# Patient Record
Sex: Male | Born: 1935 | Race: White | Hispanic: No | Marital: Married | Smoking: Former smoker
Health system: Southern US, Community
[De-identification: ages and names within clinical notes are randomized; demographics above are authoritative.]

## PROBLEM LIST (undated history)

## (undated) DIAGNOSIS — K589 Irritable bowel syndrome without diarrhea: Secondary | ICD-10-CM

## (undated) DIAGNOSIS — E785 Hyperlipidemia, unspecified: Secondary | ICD-10-CM

## (undated) DIAGNOSIS — I10 Essential (primary) hypertension: Secondary | ICD-10-CM

## (undated) DIAGNOSIS — I639 Cerebral infarction, unspecified: Secondary | ICD-10-CM

## (undated) DIAGNOSIS — M1990 Unspecified osteoarthritis, unspecified site: Secondary | ICD-10-CM

## (undated) DIAGNOSIS — D46C Myelodysplastic syndrome with isolated del(5q) chromosomal abnormality: Secondary | ICD-10-CM

## (undated) DIAGNOSIS — F329 Major depressive disorder, single episode, unspecified: Secondary | ICD-10-CM

## (undated) DIAGNOSIS — D696 Thrombocytopenia, unspecified: Secondary | ICD-10-CM

## (undated) DIAGNOSIS — D649 Anemia, unspecified: Secondary | ICD-10-CM

## (undated) DIAGNOSIS — I739 Peripheral vascular disease, unspecified: Secondary | ICD-10-CM

## (undated) DIAGNOSIS — I509 Heart failure, unspecified: Principal | ICD-10-CM

## (undated) DIAGNOSIS — F32A Depression, unspecified: Secondary | ICD-10-CM

## (undated) HISTORY — DX: Unspecified osteoarthritis, unspecified site: M19.90

## (undated) HISTORY — DX: Myelodysplastic syndrome with isolated del(5q) chromosomal abnormality: D46.C

## (undated) HISTORY — DX: Cerebral infarction, unspecified: I63.9

## (undated) HISTORY — DX: Irritable bowel syndrome, unspecified: K58.9

## (undated) HISTORY — DX: Depression, unspecified: F32.A

## (undated) HISTORY — DX: Hypercalcemia: E83.52

## (undated) HISTORY — DX: Major depressive disorder, single episode, unspecified: F32.9

## (undated) HISTORY — DX: Heart failure, unspecified: I50.9

## (undated) HISTORY — DX: Thrombocytopenia, unspecified: D69.6

## (undated) HISTORY — DX: Peripheral vascular disease, unspecified: I73.9

## (undated) HISTORY — DX: Essential (primary) hypertension: I10

## (undated) HISTORY — DX: Hyperlipidemia, unspecified: E78.5

## (undated) HISTORY — DX: Anemia, unspecified: D64.9

---
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
## 1999-02-03 ENCOUNTER — Inpatient Hospital Stay (HOSPITAL_COMMUNITY): Admission: EM | Admit: 1999-02-03 | Discharge: 1999-02-07 | Payer: Self-pay | Admitting: Emergency Medicine

## 1999-02-03 ENCOUNTER — Encounter: Payer: Self-pay | Admitting: Emergency Medicine

## 1999-11-27 ENCOUNTER — Encounter: Admission: RE | Admit: 1999-11-27 | Discharge: 2000-02-25 | Payer: Self-pay | Admitting: Endocrinology

## 2002-05-25 ENCOUNTER — Encounter (INDEPENDENT_AMBULATORY_CARE_PROVIDER_SITE_OTHER): Payer: Self-pay | Admitting: Specialist

## 2002-05-25 ENCOUNTER — Inpatient Hospital Stay (HOSPITAL_COMMUNITY): Admission: RE | Admit: 2002-05-25 | Discharge: 2002-05-27 | Payer: Self-pay | Admitting: Urology

## 2003-06-03 ENCOUNTER — Encounter: Admission: RE | Admit: 2003-06-03 | Discharge: 2003-06-03 | Payer: Self-pay | Admitting: Endocrinology

## 2003-08-06 ENCOUNTER — Encounter: Admission: RE | Admit: 2003-08-06 | Discharge: 2003-08-06 | Payer: Self-pay | Admitting: Neurology

## 2003-08-22 ENCOUNTER — Encounter: Admission: RE | Admit: 2003-08-22 | Discharge: 2003-09-15 | Payer: Self-pay | Admitting: Neurology

## 2003-12-19 ENCOUNTER — Inpatient Hospital Stay (HOSPITAL_COMMUNITY): Admission: EM | Admit: 2003-12-19 | Discharge: 2003-12-23 | Payer: Self-pay | Admitting: Emergency Medicine

## 2003-12-20 ENCOUNTER — Encounter (INDEPENDENT_AMBULATORY_CARE_PROVIDER_SITE_OTHER): Payer: Self-pay | Admitting: Cardiology

## 2003-12-29 ENCOUNTER — Encounter: Admission: RE | Admit: 2003-12-29 | Discharge: 2004-03-28 | Payer: Self-pay | Admitting: *Deleted

## 2005-01-31 ENCOUNTER — Emergency Department (HOSPITAL_COMMUNITY): Admission: EM | Admit: 2005-01-31 | Discharge: 2005-01-31 | Payer: Self-pay | Admitting: Emergency Medicine

## 2005-02-04 ENCOUNTER — Emergency Department (HOSPITAL_COMMUNITY): Admission: EM | Admit: 2005-02-04 | Discharge: 2005-02-05 | Payer: Self-pay | Admitting: Emergency Medicine

## 2005-02-26 ENCOUNTER — Emergency Department (HOSPITAL_COMMUNITY): Admission: EM | Admit: 2005-02-26 | Discharge: 2005-02-26 | Payer: Self-pay | Admitting: *Deleted

## 2005-03-23 ENCOUNTER — Inpatient Hospital Stay (HOSPITAL_COMMUNITY): Admission: EM | Admit: 2005-03-23 | Discharge: 2005-03-23 | Payer: Self-pay | Admitting: Emergency Medicine

## 2005-12-18 ENCOUNTER — Emergency Department (HOSPITAL_COMMUNITY): Admission: EM | Admit: 2005-12-18 | Discharge: 2005-12-19 | Payer: Self-pay | Admitting: Emergency Medicine

## 2006-01-30 ENCOUNTER — Ambulatory Visit (HOSPITAL_COMMUNITY): Admission: RE | Admit: 2006-01-30 | Discharge: 2006-01-30 | Payer: Self-pay | Admitting: Chiropractic Medicine

## 2009-10-07 ENCOUNTER — Inpatient Hospital Stay (HOSPITAL_COMMUNITY): Admission: EM | Admit: 2009-10-07 | Discharge: 2009-10-14 | Payer: Self-pay | Admitting: Emergency Medicine

## 2009-10-08 ENCOUNTER — Encounter (INDEPENDENT_AMBULATORY_CARE_PROVIDER_SITE_OTHER): Payer: Self-pay | Admitting: Internal Medicine

## 2009-10-12 ENCOUNTER — Encounter (INDEPENDENT_AMBULATORY_CARE_PROVIDER_SITE_OTHER): Payer: Self-pay

## 2009-10-16 ENCOUNTER — Ambulatory Visit: Payer: Self-pay | Admitting: Oncology

## 2009-10-20 LAB — CBC WITH DIFFERENTIAL/PLATELET
Eosinophils Absolute: 0.1 10*3/uL (ref 0.0–0.5)
HGB: 10.4 g/dL — ABNORMAL LOW (ref 13.0–17.1)
MCV: 103.5 fL — ABNORMAL HIGH (ref 79.3–98.0)
MONO#: 0.5 10*3/uL (ref 0.1–0.9)
MONO%: 9.5 % (ref 0.0–14.0)
NEUT#: 3.3 10*3/uL (ref 1.5–6.5)
RBC: 2.9 10*6/uL — ABNORMAL LOW (ref 4.20–5.82)
RDW: 19.8 % — ABNORMAL HIGH (ref 11.0–14.6)
WBC: 5.4 10*3/uL (ref 4.0–10.3)
lymph#: 1.3 10*3/uL (ref 0.9–3.3)

## 2009-10-20 LAB — CHCC SMEAR

## 2009-10-25 ENCOUNTER — Other Ambulatory Visit: Admission: RE | Admit: 2009-10-25 | Discharge: 2009-10-25 | Payer: Self-pay | Admitting: Oncology

## 2009-10-25 LAB — SPEP & IFE WITH QIG
Alpha-2-Globulin: 13.1 % — ABNORMAL HIGH (ref 7.1–11.8)
Beta 2: 3.2 % (ref 3.2–6.5)
Beta Globulin: 6.6 % (ref 4.7–7.2)
IgG (Immunoglobin G), Serum: 374 mg/dL — ABNORMAL LOW (ref 694–1618)
Total Protein, Serum Electrophoresis: 6.4 g/dL (ref 6.0–8.3)

## 2009-10-25 LAB — KAPPA/LAMBDA LIGHT CHAINS
Kappa free light chain: 0.89 mg/dL (ref 0.33–1.94)
Lambda Free Lght Chn: 0.92 mg/dL (ref 0.57–2.63)

## 2009-10-30 LAB — CBC WITH DIFFERENTIAL/PLATELET
BASO%: 0.6 % (ref 0.0–2.0)
Basophils Absolute: 0 10*3/uL (ref 0.0–0.1)
EOS%: 3.4 % (ref 0.0–7.0)
HCT: 26.5 % — ABNORMAL LOW (ref 38.4–49.9)
MCH: 35.4 pg — ABNORMAL HIGH (ref 27.2–33.4)
MCHC: 34.6 g/dL (ref 32.0–36.0)
MONO#: 0.6 10*3/uL (ref 0.1–0.9)
MONO%: 13.3 % (ref 0.0–14.0)
NEUT#: 2.3 10*3/uL (ref 1.5–6.5)
NEUT%: 48.6 % (ref 39.0–75.0)
Platelets: 154 10*3/uL (ref 140–400)

## 2009-11-07 ENCOUNTER — Encounter (HOSPITAL_COMMUNITY): Admission: RE | Admit: 2009-11-07 | Discharge: 2009-12-13 | Payer: Self-pay | Admitting: Oncology

## 2009-11-07 LAB — CBC WITH DIFFERENTIAL/PLATELET
BASO%: 1.3 % (ref 0.0–2.0)
Basophils Absolute: 0 10*3/uL (ref 0.0–0.1)
EOS%: 4.6 % (ref 0.0–7.0)
HGB: 9 g/dL — ABNORMAL LOW (ref 13.0–17.1)
MCH: 36.7 pg — ABNORMAL HIGH (ref 27.2–33.4)
MCHC: 35.7 g/dL (ref 32.0–36.0)
MCV: 102.8 fL — ABNORMAL HIGH (ref 79.3–98.0)
MONO%: 11.3 % (ref 0.0–14.0)
RDW: 21.7 % — ABNORMAL HIGH (ref 11.0–14.6)
lymph#: 1.3 10*3/uL (ref 0.9–3.3)

## 2009-11-07 LAB — TYPE & CROSSMATCH - CHCC

## 2009-11-14 LAB — CBC WITH DIFFERENTIAL/PLATELET
BASO%: 1 % (ref 0.0–2.0)
EOS%: 2.7 % (ref 0.0–7.0)
HCT: 28.8 % — ABNORMAL LOW (ref 38.4–49.9)
LYMPH%: 33.1 % (ref 14.0–49.0)
MCH: 34.5 pg — ABNORMAL HIGH (ref 27.2–33.4)
MCHC: 34.6 g/dL (ref 32.0–36.0)
NEUT%: 51.8 % (ref 39.0–75.0)
Platelets: 166 10*3/uL (ref 140–400)
RBC: 2.89 10*6/uL — ABNORMAL LOW (ref 4.20–5.82)
WBC: 4.5 10*3/uL (ref 4.0–10.3)

## 2009-11-22 ENCOUNTER — Ambulatory Visit: Payer: Self-pay | Admitting: Oncology

## 2009-11-23 LAB — CBC WITH DIFFERENTIAL/PLATELET
BASO%: 1.1 % (ref 0.0–2.0)
EOS%: 1.8 % (ref 0.0–7.0)
MCH: 34.6 pg — ABNORMAL HIGH (ref 27.2–33.4)
MCHC: 35.1 g/dL (ref 32.0–36.0)
MONO#: 0.6 10*3/uL (ref 0.1–0.9)
RBC: 2.71 10*6/uL — ABNORMAL LOW (ref 4.20–5.82)
RDW: 23.7 % — ABNORMAL HIGH (ref 11.0–14.6)
WBC: 5.6 10*3/uL (ref 4.0–10.3)
lymph#: 1.7 10*3/uL (ref 0.9–3.3)

## 2009-11-23 LAB — HOLD TUBE, BLOOD BANK

## 2009-12-08 LAB — CBC WITH DIFFERENTIAL/PLATELET
Basophils Absolute: 0.1 10*3/uL (ref 0.0–0.1)
HGB: 6.7 g/dL — CL (ref 13.0–17.1)
LYMPH%: 33.5 % (ref 14.0–49.0)
MONO#: 0.4 10*3/uL (ref 0.1–0.9)
NEUT%: 48.5 % (ref 39.0–75.0)
Platelets: 142 10*3/uL (ref 140–400)
RBC: 2.06 10*6/uL — ABNORMAL LOW (ref 4.20–5.82)

## 2009-12-08 LAB — HOLD TUBE, BLOOD BANK

## 2009-12-11 LAB — TYPE & CROSSMATCH - CHCC

## 2009-12-13 ENCOUNTER — Encounter: Payer: Self-pay | Admitting: Oncology

## 2009-12-13 ENCOUNTER — Ambulatory Visit
Admission: RE | Admit: 2009-12-13 | Discharge: 2009-12-13 | Payer: Self-pay | Source: Home / Self Care | Admitting: Oncology

## 2009-12-13 ENCOUNTER — Ambulatory Visit: Payer: Self-pay | Admitting: Vascular Surgery

## 2009-12-13 LAB — CBC WITH DIFFERENTIAL/PLATELET
BASO%: 3.4 % — ABNORMAL HIGH (ref 0.0–2.0)
EOS%: 6.2 % (ref 0.0–7.0)
HCT: 22.7 % — ABNORMAL LOW (ref 38.4–49.9)
HGB: 7.5 g/dL — ABNORMAL LOW (ref 13.0–17.1)
LYMPH%: 47.8 % (ref 14.0–49.0)
MCH: 31.5 pg (ref 27.2–33.4)
MONO%: 14 % (ref 0.0–14.0)
NEUT#: 0.5 10*3/uL — ABNORMAL LOW (ref 1.5–6.5)
lymph#: 0.9 10*3/uL (ref 0.9–3.3)
nRBC: 0 % (ref 0–0)

## 2009-12-14 LAB — CBC WITH DIFFERENTIAL/PLATELET
Eosinophils Absolute: 0.1 10*3/uL (ref 0.0–0.5)
LYMPH%: 50.5 % — ABNORMAL HIGH (ref 14.0–49.0)
MCHC: 33 g/dL (ref 32.0–36.0)
MONO#: 0.3 10*3/uL (ref 0.1–0.9)
MONO%: 16.2 % — ABNORMAL HIGH (ref 0.0–14.0)
NEUT#: 0.5 10*3/uL — ABNORMAL LOW (ref 1.5–6.5)
NEUT%: 23 % — ABNORMAL LOW (ref 39.0–75.0)
Platelets: 115 10*3/uL — ABNORMAL LOW (ref 140–400)
RBC: 2.41 10*6/uL — ABNORMAL LOW (ref 4.20–5.82)
RDW: 19.1 % — ABNORMAL HIGH (ref 11.0–14.6)
WBC: 2 10*3/uL — ABNORMAL LOW (ref 4.0–10.3)
nRBC: 0 % (ref 0–0)

## 2009-12-14 LAB — TYPE & CROSSMATCH - CHCC

## 2009-12-21 LAB — CBC WITH DIFFERENTIAL/PLATELET
BASO%: 7.6 % — ABNORMAL HIGH (ref 0.0–2.0)
EOS%: 5.2 % (ref 0.0–7.0)
HCT: 27.8 % — ABNORMAL LOW (ref 38.4–49.9)
HGB: 9.1 g/dL — ABNORMAL LOW (ref 13.0–17.1)
LYMPH%: 33.2 % (ref 14.0–49.0)
MCH: 31.2 pg (ref 27.2–33.4)
MCV: 95.2 fL (ref 79.3–98.0)
MONO#: 0.6 10*3/uL (ref 0.1–0.9)
MONO%: 15.5 % — ABNORMAL HIGH (ref 0.0–14.0)
NEUT#: 1.4 10*3/uL — ABNORMAL LOW (ref 1.5–6.5)
Platelets: 171 10*3/uL (ref 140–400)
RDW: 18.3 % — ABNORMAL HIGH (ref 11.0–14.6)
nRBC: 0 % (ref 0–0)

## 2009-12-26 ENCOUNTER — Ambulatory Visit: Payer: Self-pay | Admitting: Oncology

## 2009-12-28 LAB — CBC WITH DIFFERENTIAL/PLATELET
Eosinophils Absolute: 0.3 10*3/uL (ref 0.0–0.5)
LYMPH%: 32.4 % (ref 14.0–49.0)
MCHC: 35.4 g/dL (ref 32.0–36.0)
MCV: 94.3 fL (ref 79.3–98.0)
MONO%: 7.9 % (ref 0.0–14.0)
NEUT#: 1.5 10*3/uL (ref 1.5–6.5)
NEUT%: 47.9 % (ref 39.0–75.0)
Platelets: 102 10*3/uL — ABNORMAL LOW (ref 140–400)
RDW: 21.6 % — ABNORMAL HIGH (ref 11.0–14.6)
WBC: 3.2 10*3/uL — ABNORMAL LOW (ref 4.0–10.3)
lymph#: 1 10*3/uL (ref 0.9–3.3)

## 2009-12-28 LAB — HOLD TUBE, BLOOD BANK

## 2010-01-04 LAB — CBC WITH DIFFERENTIAL/PLATELET
Basophils Absolute: 0 10*3/uL (ref 0.0–0.1)
HCT: 24.3 % — ABNORMAL LOW (ref 38.4–49.9)
LYMPH%: 34.1 % (ref 14.0–49.0)
MCHC: 34.5 g/dL (ref 32.0–36.0)
MONO#: 0.4 10*3/uL (ref 0.1–0.9)
NEUT#: 1.7 10*3/uL (ref 1.5–6.5)
NEUT%: 44.8 % (ref 39.0–75.0)
Platelets: 105 10*3/uL — ABNORMAL LOW (ref 140–400)
RBC: 2.54 10*6/uL — ABNORMAL LOW (ref 4.20–5.82)
RDW: 22.7 % — ABNORMAL HIGH (ref 11.0–14.6)

## 2010-01-11 LAB — CBC WITH DIFFERENTIAL/PLATELET
BASO%: 0.9 % (ref 0.0–2.0)
Basophils Absolute: 0 10*3/uL (ref 0.0–0.1)
EOS%: 7.7 % — ABNORMAL HIGH (ref 0.0–7.0)
Eosinophils Absolute: 0.2 10*3/uL (ref 0.0–0.5)
HCT: 26.1 % — ABNORMAL LOW (ref 38.4–49.9)
HGB: 9.2 g/dL — ABNORMAL LOW (ref 13.0–17.1)
LYMPH%: 40.7 % (ref 14.0–49.0)
MCH: 34.5 pg — ABNORMAL HIGH (ref 27.2–33.4)
MCHC: 35.2 g/dL (ref 32.0–36.0)
MCV: 97.9 fL (ref 79.3–98.0)
MONO#: 0.3 10*3/uL (ref 0.1–0.9)
MONO%: 11.8 % (ref 0.0–14.0)
NEUT#: 1 10*3/uL — ABNORMAL LOW (ref 1.5–6.5)
NEUT%: 38.9 % — ABNORMAL LOW (ref 39.0–75.0)
Platelets: 140 10*3/uL (ref 140–400)
RBC: 2.67 10*6/uL — ABNORMAL LOW (ref 4.20–5.82)
RDW: 23 % — ABNORMAL HIGH (ref 11.0–14.6)
WBC: 2.5 10*3/uL — ABNORMAL LOW (ref 4.0–10.3)
lymph#: 1 10*3/uL (ref 0.9–3.3)

## 2010-01-11 LAB — HOLD TUBE, BLOOD BANK

## 2010-01-17 LAB — CBC WITH DIFFERENTIAL/PLATELET
BASO%: 1.7 % (ref 0.0–2.0)
Basophils Absolute: 0.1 10e3/uL (ref 0.0–0.1)
EOS%: 4.5 % (ref 0.0–7.0)
Eosinophils Absolute: 0.2 10e3/uL (ref 0.0–0.5)
HCT: 29.3 % — ABNORMAL LOW (ref 38.4–49.9)
HGB: 10.2 g/dL — ABNORMAL LOW (ref 13.0–17.1)
LYMPH%: 45.2 % (ref 14.0–49.0)
MCH: 34.5 pg — ABNORMAL HIGH (ref 27.2–33.4)
MCHC: 34.6 g/dL (ref 32.0–36.0)
MCV: 99.7 fL — ABNORMAL HIGH (ref 79.3–98.0)
MONO#: 0.4 10e3/uL (ref 0.1–0.9)
MONO%: 12.8 % (ref 0.0–14.0)
NEUT#: 1.2 10e3/uL — ABNORMAL LOW (ref 1.5–6.5)
NEUT%: 35.8 % — ABNORMAL LOW (ref 39.0–75.0)
Platelets: 163 10e3/uL (ref 140–400)
RBC: 2.94 10e6/uL — ABNORMAL LOW (ref 4.20–5.82)
RDW: 22.1 % — ABNORMAL HIGH (ref 11.0–14.6)
WBC: 3.4 10e3/uL — ABNORMAL LOW (ref 4.0–10.3)
lymph#: 1.5 10e3/uL (ref 0.9–3.3)

## 2010-01-30 ENCOUNTER — Ambulatory Visit: Payer: Self-pay | Admitting: Oncology

## 2010-02-01 LAB — CBC WITH DIFFERENTIAL/PLATELET
Basophils Absolute: 0.2 10*3/uL — ABNORMAL HIGH (ref 0.0–0.1)
EOS%: 6.7 % (ref 0.0–7.0)
Eosinophils Absolute: 0.4 10*3/uL (ref 0.0–0.5)
HGB: 11.7 g/dL — ABNORMAL LOW (ref 13.0–17.1)
LYMPH%: 35 % (ref 14.0–49.0)
MCHC: 32.5 g/dL (ref 32.0–36.0)
MCV: 100.3 fL — ABNORMAL HIGH (ref 79.3–98.0)
MONO%: 14.1 % — ABNORMAL HIGH (ref 0.0–14.0)
NEUT#: 2.2 10*3/uL (ref 1.5–6.5)
NEUT%: 40.8 % (ref 39.0–75.0)
RBC: 3.59 10*6/uL — ABNORMAL LOW (ref 4.20–5.82)
WBC: 5.3 10*3/uL (ref 4.0–10.3)
lymph#: 1.8 10*3/uL (ref 0.9–3.3)

## 2010-02-13 LAB — CBC WITH DIFFERENTIAL/PLATELET
Basophils Absolute: 0.1 10*3/uL (ref 0.0–0.1)
Eosinophils Absolute: 0.3 10*3/uL (ref 0.0–0.5)
HCT: 33.5 % — ABNORMAL LOW (ref 38.4–49.9)
MCHC: 35.3 g/dL (ref 32.0–36.0)
MONO#: 0.4 10*3/uL (ref 0.1–0.9)
RBC: 3.44 10*6/uL — ABNORMAL LOW (ref 4.20–5.82)
RDW: 16.1 % — ABNORMAL HIGH (ref 11.0–14.6)
WBC: 3.4 10*3/uL — ABNORMAL LOW (ref 4.0–10.3)

## 2010-02-28 LAB — CBC WITH DIFFERENTIAL/PLATELET
BASO%: 4.2 % — ABNORMAL HIGH (ref 0.0–2.0)
EOS%: 7.9 % — ABNORMAL HIGH (ref 0.0–7.0)
Eosinophils Absolute: 0.2 10*3/uL (ref 0.0–0.5)
HCT: 36.1 % — ABNORMAL LOW (ref 38.4–49.9)
HGB: 12.2 g/dL — ABNORMAL LOW (ref 13.0–17.1)
LYMPH%: 51.7 % — ABNORMAL HIGH (ref 14.0–49.0)
MCH: 32.9 pg (ref 27.2–33.4)
MCHC: 33.8 g/dL (ref 32.0–36.0)
MCV: 97.3 fL (ref 79.3–98.0)
MONO#: 0.6 10*3/uL (ref 0.1–0.9)
MONO%: 23.3 % — ABNORMAL HIGH (ref 0.0–14.0)
NEUT#: 0.3 10*3/uL — CL (ref 1.5–6.5)
Platelets: 110 10*3/uL — ABNORMAL LOW (ref 140–400)
WBC: 2.4 10*3/uL — ABNORMAL LOW (ref 4.0–10.3)
nRBC: 0 % (ref 0–0)

## 2010-02-28 LAB — COMPREHENSIVE METABOLIC PANEL
Albumin: 4.4 g/dL (ref 3.5–5.2)
Glucose, Bld: 214 mg/dL — ABNORMAL HIGH (ref 70–99)
Potassium: 4.7 mEq/L (ref 3.5–5.3)
Total Bilirubin: 0.6 mg/dL (ref 0.3–1.2)
Total Protein: 6.1 g/dL (ref 6.0–8.3)

## 2010-03-07 ENCOUNTER — Ambulatory Visit: Payer: Self-pay | Admitting: Oncology

## 2010-03-07 LAB — CBC WITH DIFFERENTIAL/PLATELET
Eosinophils Absolute: 0.1 10*3/uL (ref 0.0–0.5)
LYMPH%: 36.9 % (ref 14.0–49.0)
MCHC: 34 g/dL (ref 32.0–36.0)
MCV: 96.4 fL (ref 79.3–98.0)
MONO#: 0.4 10*3/uL (ref 0.1–0.9)
MONO%: 10 % (ref 0.0–14.0)
RBC: 3.6 10*6/uL — ABNORMAL LOW (ref 4.20–5.82)
RDW: 14.3 % (ref 11.0–14.6)
WBC: 4.3 10*3/uL (ref 4.0–10.3)
lymph#: 1.6 10*3/uL (ref 0.9–3.3)

## 2010-03-15 LAB — CBC WITH DIFFERENTIAL/PLATELET
BASO%: 1.5 % (ref 0.0–2.0)
HCT: 36.3 % — ABNORMAL LOW (ref 38.4–49.9)
HGB: 12.5 g/dL — ABNORMAL LOW (ref 13.0–17.1)
LYMPH%: 37 % (ref 14.0–49.0)
MCH: 33.2 pg (ref 27.2–33.4)
MONO%: 6.8 % (ref 0.0–14.0)
Platelets: 152 10*3/uL (ref 140–400)
RBC: 3.76 10*6/uL — ABNORMAL LOW (ref 4.20–5.82)
WBC: 5 10*3/uL (ref 4.0–10.3)
lymph#: 1.9 10*3/uL (ref 0.9–3.3)

## 2010-04-06 ENCOUNTER — Ambulatory Visit: Payer: Self-pay | Admitting: Oncology

## 2010-04-06 LAB — CBC WITH DIFFERENTIAL/PLATELET
BASO%: 1.1 % (ref 0.0–2.0)
HCT: 34.7 % — ABNORMAL LOW (ref 38.4–49.9)
MCH: 33.9 pg — ABNORMAL HIGH (ref 27.2–33.4)
MCHC: 35.5 g/dL (ref 32.0–36.0)
MCV: 95.5 fL (ref 79.3–98.0)
Platelets: 135 10*3/uL — ABNORMAL LOW (ref 140–400)
RBC: 3.63 10*6/uL — ABNORMAL LOW (ref 4.20–5.82)
RDW: 14.3 % (ref 11.0–14.6)
lymph#: 1.5 10*3/uL (ref 0.9–3.3)

## 2010-04-26 LAB — CBC WITH DIFFERENTIAL/PLATELET
Basophils Absolute: 0.1 10*3/uL (ref 0.0–0.1)
EOS%: 3 % (ref 0.0–7.0)
HCT: 35.2 % — ABNORMAL LOW (ref 38.4–49.9)
HGB: 12 g/dL — ABNORMAL LOW (ref 13.0–17.1)
LYMPH%: 32.3 % (ref 14.0–49.0)
MCV: 94.6 fL (ref 79.3–98.0)
NEUT#: 2.8 10*3/uL (ref 1.5–6.5)
NEUT%: 51.4 % (ref 39.0–75.0)
Platelets: 109 10*3/uL — ABNORMAL LOW (ref 140–400)
WBC: 5.4 10*3/uL (ref 4.0–10.3)
nRBC: 0 % (ref 0–0)

## 2010-05-15 ENCOUNTER — Ambulatory Visit: Payer: Self-pay | Admitting: Oncology

## 2010-05-17 LAB — CBC WITH DIFFERENTIAL/PLATELET
Basophils Absolute: 0.1 10*3/uL (ref 0.0–0.1)
Eosinophils Absolute: 0.1 10*3/uL (ref 0.0–0.5)
HGB: 11.8 g/dL — ABNORMAL LOW (ref 13.0–17.1)
LYMPH%: 34.6 % (ref 14.0–49.0)
MCH: 33.3 pg (ref 27.2–33.4)
MCV: 95.2 fL (ref 79.3–98.0)
MONO%: 10 % (ref 0.0–14.0)
NEUT#: 2.2 10*3/uL (ref 1.5–6.5)
Platelets: 107 10*3/uL — ABNORMAL LOW (ref 140–400)

## 2010-05-17 LAB — COMPREHENSIVE METABOLIC PANEL
Alkaline Phosphatase: 44 U/L (ref 39–117)
BUN: 27 mg/dL — ABNORMAL HIGH (ref 6–23)
Creatinine, Ser: 1.09 mg/dL (ref 0.40–1.50)
Glucose, Bld: 230 mg/dL — ABNORMAL HIGH (ref 70–99)
Total Bilirubin: 0.6 mg/dL (ref 0.3–1.2)

## 2010-06-07 LAB — CBC WITH DIFFERENTIAL/PLATELET
Basophils Absolute: 0.1 10*3/uL (ref 0.0–0.1)
Eosinophils Absolute: 0.2 10*3/uL (ref 0.0–0.5)
HGB: 11.6 g/dL — ABNORMAL LOW (ref 13.0–17.1)
LYMPH%: 37.4 % (ref 14.0–49.0)
MCV: 93.7 fL (ref 79.3–98.0)
MONO%: 11.8 % (ref 0.0–14.0)
NEUT#: 1.8 10*3/uL (ref 1.5–6.5)
Platelets: 113 10*3/uL — ABNORMAL LOW (ref 140–400)
RBC: 3.66 10*6/uL — ABNORMAL LOW (ref 4.20–5.82)

## 2010-06-26 ENCOUNTER — Ambulatory Visit: Payer: Self-pay | Admitting: Oncology

## 2010-06-28 LAB — CBC WITH DIFFERENTIAL/PLATELET
Basophils Absolute: 0.1 10*3/uL (ref 0.0–0.1)
Eosinophils Absolute: 0.2 10*3/uL (ref 0.0–0.5)
HCT: 34.5 % — ABNORMAL LOW (ref 38.4–49.9)
HGB: 12 g/dL — ABNORMAL LOW (ref 13.0–17.1)
MCV: 95.7 fL (ref 79.3–98.0)
MONO%: 10.3 % (ref 0.0–14.0)
NEUT#: 2.2 10*3/uL (ref 1.5–6.5)
NEUT%: 48.1 % (ref 39.0–75.0)
RDW: 14.5 % (ref 11.0–14.6)
lymph#: 1.6 10*3/uL (ref 0.9–3.3)

## 2010-07-26 ENCOUNTER — Ambulatory Visit (HOSPITAL_BASED_OUTPATIENT_CLINIC_OR_DEPARTMENT_OTHER): Payer: Medicare Other | Admitting: Oncology

## 2010-07-26 LAB — CBC WITH DIFFERENTIAL/PLATELET
BASO%: 1 % (ref 0.0–2.0)
Basophils Absolute: 0 10*3/uL (ref 0.0–0.1)
Eosinophils Absolute: 0.2 10*3/uL (ref 0.0–0.5)
HCT: 36.4 % — ABNORMAL LOW (ref 38.4–49.9)
MCHC: 34 g/dL (ref 32.0–36.0)
MONO#: 0.5 10*3/uL (ref 0.1–0.9)
Platelets: 108 10*3/uL — ABNORMAL LOW (ref 140–400)
RBC: 3.79 10*6/uL — ABNORMAL LOW (ref 4.20–5.82)
RDW: 14.8 % — ABNORMAL HIGH (ref 11.0–14.6)
WBC: 4.6 10*3/uL (ref 4.0–10.3)

## 2010-09-06 ENCOUNTER — Encounter (HOSPITAL_BASED_OUTPATIENT_CLINIC_OR_DEPARTMENT_OTHER): Payer: Medicare Other | Admitting: Oncology

## 2010-09-06 DIAGNOSIS — D46C Myelodysplastic syndrome with isolated del(5q) chromosomal abnormality: Secondary | ICD-10-CM

## 2010-09-06 DIAGNOSIS — D539 Nutritional anemia, unspecified: Secondary | ICD-10-CM

## 2010-09-06 DIAGNOSIS — R609 Edema, unspecified: Secondary | ICD-10-CM

## 2010-09-06 DIAGNOSIS — Z23 Encounter for immunization: Secondary | ICD-10-CM

## 2010-09-06 LAB — CBC WITH DIFFERENTIAL/PLATELET
Basophils Absolute: 0 10*3/uL (ref 0.0–0.1)
Eosinophils Absolute: 0.2 10*3/uL (ref 0.0–0.5)
HCT: 37.1 % — ABNORMAL LOW (ref 38.4–49.9)
HGB: 12.7 g/dL — ABNORMAL LOW (ref 13.0–17.1)
LYMPH%: 32.7 % (ref 14.0–49.0)
MCV: 93.5 fL (ref 79.3–98.0)
MONO%: 8.7 % (ref 0.0–14.0)
NEUT#: 2 10*3/uL (ref 1.5–6.5)
Platelets: 115 10*3/uL — ABNORMAL LOW (ref 140–400)
RDW: 14.2 % (ref 11.0–14.6)

## 2010-09-16 LAB — CROSSMATCH: Antibody Screen: NEGATIVE

## 2010-09-17 LAB — CROSSMATCH

## 2010-09-18 LAB — CBC
HCT: 26.8 % — ABNORMAL LOW (ref 39.0–52.0)
Hemoglobin: 7.6 g/dL — ABNORMAL LOW (ref 13.0–17.0)
MCHC: 34.1 g/dL (ref 30.0–36.0)
MCHC: 34.9 g/dL (ref 30.0–36.0)
MCV: 103.3 fL — ABNORMAL HIGH (ref 78.0–100.0)
Platelets: 182 10*3/uL (ref 150–400)
RBC: 2.73 MIL/uL — ABNORMAL LOW (ref 4.22–5.81)
RDW: 20.5 % — ABNORMAL HIGH (ref 11.5–15.5)
RDW: 21.7 % — ABNORMAL HIGH (ref 11.5–15.5)
WBC: 4.5 10*3/uL (ref 4.0–10.5)

## 2010-09-18 LAB — CROSSMATCH

## 2010-09-18 LAB — DIFFERENTIAL
Basophils Absolute: 0.1 10*3/uL (ref 0.0–0.1)
Basophils Relative: 1 % (ref 0–1)
Eosinophils Absolute: 0.2 10*3/uL (ref 0.0–0.7)
Eosinophils Relative: 4 % (ref 0–5)
Lymphs Abs: 1.6 10*3/uL (ref 0.7–4.0)

## 2010-09-18 LAB — TYPE AND SCREEN

## 2010-09-18 LAB — GLUCOSE, CAPILLARY
Glucose-Capillary: 183 mg/dL — ABNORMAL HIGH (ref 70–99)
Glucose-Capillary: 195 mg/dL — ABNORMAL HIGH (ref 70–99)

## 2010-09-18 LAB — BASIC METABOLIC PANEL
BUN: 10 mg/dL (ref 6–23)
CO2: 27 mEq/L (ref 19–32)
Calcium: 8.6 mg/dL (ref 8.4–10.5)
Calcium: 8.8 mg/dL (ref 8.4–10.5)
Creatinine, Ser: 0.96 mg/dL (ref 0.4–1.5)
GFR calc Af Amer: >60 mL/min (ref >60)
Glucose, Bld: 124 mg/dL — ABNORMAL HIGH (ref 70–99)
Sodium: 143 mEq/L (ref 135–145)

## 2010-09-18 LAB — BONE MARROW EXAM

## 2010-09-18 LAB — PREPARE RBC (CROSSMATCH)

## 2010-09-19 LAB — GLUCOSE, CAPILLARY
Glucose-Capillary: 117 mg/dL — ABNORMAL HIGH (ref 70–99)
Glucose-Capillary: 120 mg/dL — ABNORMAL HIGH (ref 70–99)
Glucose-Capillary: 123 mg/dL — ABNORMAL HIGH (ref 70–99)
Glucose-Capillary: 138 mg/dL — ABNORMAL HIGH (ref 70–99)
Glucose-Capillary: 164 mg/dL — ABNORMAL HIGH (ref 70–99)
Glucose-Capillary: 165 mg/dL — ABNORMAL HIGH (ref 70–99)
Glucose-Capillary: 177 mg/dL — ABNORMAL HIGH (ref 70–99)
Glucose-Capillary: 178 mg/dL — ABNORMAL HIGH (ref 70–99)
Glucose-Capillary: 198 mg/dL — ABNORMAL HIGH (ref 70–99)
Glucose-Capillary: 229 mg/dL — ABNORMAL HIGH (ref 70–99)
Glucose-Capillary: 240 mg/dL — ABNORMAL HIGH (ref 70–99)
Glucose-Capillary: 367 mg/dL — ABNORMAL HIGH (ref 70–99)
Glucose-Capillary: 376 mg/dL — ABNORMAL HIGH (ref 70–99)

## 2010-09-19 LAB — COMPREHENSIVE METABOLIC PANEL
ALT: 150 U/L — ABNORMAL HIGH (ref 0–53)
AST: 63 U/L — ABNORMAL HIGH (ref 0–37)
Albumin: 3.5 g/dL (ref 3.5–5.2)
BUN: 45 mg/dL — ABNORMAL HIGH (ref 6–23)
Calcium: 8.6 mg/dL (ref 8.4–10.5)
Calcium: 9.2 mg/dL (ref 8.4–10.5)
Chloride: 112 mEq/L (ref 96–112)
Creatinine, Ser: 1.57 mg/dL — ABNORMAL HIGH (ref 0.4–1.5)
GFR calc Af Amer: >60 mL/min (ref >60)
Sodium: 142 mEq/L (ref 135–145)
Total Bilirubin: 0.7 mg/dL (ref 0.3–1.2)
Total Protein: 5.3 g/dL — ABNORMAL LOW (ref 6.0–8.3)

## 2010-09-19 LAB — CBC
HCT: 22.6 % — ABNORMAL LOW (ref 39.0–52.0)
HCT: 22.7 % — ABNORMAL LOW (ref 39.0–52.0)
HCT: 23.2 % — ABNORMAL LOW (ref 39.0–52.0)
HCT: 23.3 % — ABNORMAL LOW (ref 39.0–52.0)
Hemoglobin: 8 g/dL — ABNORMAL LOW (ref 13.0–17.0)
MCHC: 34.4 g/dL (ref 30.0–36.0)
MCHC: 34.5 g/dL (ref 30.0–36.0)
MCHC: 34.6 g/dL (ref 30.0–36.0)
MCHC: 34.9 g/dL (ref 30.0–36.0)
MCHC: 35.2 g/dL (ref 30.0–36.0)
MCHC: 35.2 g/dL (ref 30.0–36.0)
MCV: 105.4 fL — ABNORMAL HIGH (ref 78.0–100.0)
MCV: 105.6 fL — ABNORMAL HIGH (ref 78.0–100.0)
MCV: 105.9 fL — ABNORMAL HIGH (ref 78.0–100.0)
MCV: 113.9 fL — ABNORMAL HIGH (ref 78.0–100.0)
Platelets: 118 10*3/uL — ABNORMAL LOW (ref 150–400)
Platelets: 123 10*3/uL — ABNORMAL LOW (ref 150–400)
Platelets: 130 10*3/uL — ABNORMAL LOW (ref 150–400)
Platelets: 147 10*3/uL — ABNORMAL LOW (ref 150–400)
RBC: 2.14 MIL/uL — ABNORMAL LOW (ref 4.22–5.81)
RBC: 2.14 MIL/uL — ABNORMAL LOW (ref 4.22–5.81)
RDW: 16.8 % — ABNORMAL HIGH (ref 11.5–15.5)
RDW: 21.9 % — ABNORMAL HIGH (ref 11.5–15.5)
RDW: 22 % — ABNORMAL HIGH (ref 11.5–15.5)
RDW: 22 % — ABNORMAL HIGH (ref 11.5–15.5)
RDW: 23 % — ABNORMAL HIGH (ref 11.5–15.5)
RDW: 23.4 % — ABNORMAL HIGH (ref 11.5–15.5)
WBC: 5.1 10*3/uL (ref 4.0–10.5)
WBC: 6.1 10*3/uL (ref 4.0–10.5)
WBC: 7 10*3/uL (ref 4.0–10.5)

## 2010-09-19 LAB — CARDIAC PANEL(CRET KIN+CKTOT+MB+TROPI)
CK, MB: 1.1 ng/mL (ref 0.3–4.0)
CK, MB: 1.2 ng/mL (ref 0.3–4.0)
CK, MB: 1.4 ng/mL (ref 0.3–4.0)
Relative Index: INVALID (ref 0.0–2.5)
Total CK: 35 U/L (ref 7–232)
Total CK: 50 U/L (ref 7–232)
Troponin I: 0.01 ng/mL (ref 0.00–0.06)
Troponin I: 0.02 ng/mL (ref 0.00–0.06)

## 2010-09-19 LAB — BASIC METABOLIC PANEL
BUN: 12 mg/dL (ref 6–23)
CO2: 26 mEq/L (ref 19–32)
Calcium: 8.1 mg/dL — ABNORMAL LOW (ref 8.4–10.5)
Calcium: 8.4 mg/dL (ref 8.4–10.5)
Chloride: 110 mEq/L (ref 96–112)
Creatinine, Ser: 0.82 mg/dL (ref 0.4–1.5)
GFR calc Af Amer: >60 mL/min (ref >60)
GFR calc non Af Amer: >60 mL/min (ref >60)
Glucose, Bld: 105 mg/dL — ABNORMAL HIGH (ref 70–99)
Glucose, Bld: 91 mg/dL (ref 70–99)
Sodium: 140 mEq/L (ref 135–145)

## 2010-09-19 LAB — DIFFERENTIAL
Basophils Absolute: 0.1 10*3/uL (ref 0.0–0.1)
Basophils Absolute: 0.1 10*3/uL (ref 0.0–0.1)
Basophils Absolute: 0.1 10*3/uL (ref 0.0–0.1)
Eosinophils Absolute: 0.1 10*3/uL (ref 0.0–0.7)
Eosinophils Absolute: 0.1 10*3/uL (ref 0.0–0.7)
Eosinophils Relative: 1 % (ref 0–5)
Eosinophils Relative: 2 % (ref 0–5)
Eosinophils Relative: 3 % (ref 0–5)
Lymphs Abs: 1 10*3/uL (ref 0.7–4.0)
Lymphs Abs: 1.2 10*3/uL (ref 0.7–4.0)
Lymphs Abs: 1.2 10*3/uL (ref 0.7–4.0)
Monocytes Absolute: 0.7 10*3/uL (ref 0.1–1.0)
Monocytes Absolute: 0.8 10*3/uL (ref 0.1–1.0)
Monocytes Relative: 13 % — ABNORMAL HIGH (ref 3–12)
Monocytes Relative: 13 % — ABNORMAL HIGH (ref 3–12)
Monocytes Relative: 14 % — ABNORMAL HIGH (ref 3–12)
Neutro Abs: 4.7 10*3/uL (ref 1.7–7.7)
Neutrophils Relative %: 59 % (ref 43–77)
Neutrophils Relative %: 69 % (ref 43–77)

## 2010-09-19 LAB — TYPE AND SCREEN: ABO/RH(D): O POS

## 2010-09-19 LAB — POCT I-STAT, CHEM 8
BUN: 70 mg/dL — ABNORMAL HIGH (ref 6–23)
Creatinine, Ser: 2.3 mg/dL — ABNORMAL HIGH (ref 0.4–1.5)
Glucose, Bld: 205 mg/dL — ABNORMAL HIGH (ref 70–99)
Hemoglobin: 6.1 g/dL — CL (ref 13.0–17.0)
Potassium: 5.8 mEq/L — ABNORMAL HIGH (ref 3.5–5.1)

## 2010-09-19 LAB — URINALYSIS, ROUTINE W REFLEX MICROSCOPIC
Ketones, ur: NEGATIVE mg/dL
Nitrite: NEGATIVE
Protein, ur: NEGATIVE mg/dL

## 2010-09-19 LAB — URINE CULTURE

## 2010-09-19 LAB — MAGNESIUM
Magnesium: 1.5 mg/dL (ref 1.5–2.5)
Magnesium: 2.2 mg/dL (ref 1.5–2.5)

## 2010-09-19 LAB — T4, FREE: Free T4: 1.21 ng/dL (ref 0.80–1.80)

## 2010-09-19 LAB — HEMOGLOBIN A1C: Hgb A1c MFr Bld: 6.6 % — ABNORMAL HIGH (ref 4.6–6.1)

## 2010-09-19 LAB — FOLATE RBC: RBC Folate: 1786 ng/mL — ABNORMAL HIGH (ref 180–600)

## 2010-09-19 LAB — POCT CARDIAC MARKERS: Myoglobin, poc: 128 ng/mL (ref 12–200)

## 2010-09-19 LAB — PROTIME-INR: INR: 1.26 (ref 0.00–1.49)

## 2010-09-19 LAB — HEMOCCULT GUIAC POC 1CARD (OFFICE): Fecal Occult Bld: NEGATIVE

## 2010-09-19 LAB — FERRITIN: Ferritin: 901 ng/mL — ABNORMAL HIGH (ref 22–322)

## 2010-10-18 ENCOUNTER — Encounter (HOSPITAL_BASED_OUTPATIENT_CLINIC_OR_DEPARTMENT_OTHER): Payer: Medicare Other | Admitting: Oncology

## 2010-10-18 ENCOUNTER — Other Ambulatory Visit: Payer: Self-pay | Admitting: Oncology

## 2010-10-18 DIAGNOSIS — D696 Thrombocytopenia, unspecified: Secondary | ICD-10-CM

## 2010-10-18 DIAGNOSIS — R609 Edema, unspecified: Secondary | ICD-10-CM

## 2010-10-18 DIAGNOSIS — D539 Nutritional anemia, unspecified: Secondary | ICD-10-CM

## 2010-10-18 DIAGNOSIS — D649 Anemia, unspecified: Secondary | ICD-10-CM

## 2010-10-18 DIAGNOSIS — D46C Myelodysplastic syndrome with isolated del(5q) chromosomal abnormality: Secondary | ICD-10-CM

## 2010-10-18 LAB — CBC WITH DIFFERENTIAL/PLATELET
BASO%: 1.3 % (ref 0.0–2.0)
Basophils Absolute: 0.1 10*3/uL (ref 0.0–0.1)
EOS%: 4 % (ref 0.0–7.0)
HGB: 11.8 g/dL — ABNORMAL LOW (ref 13.0–17.1)
MCH: 32.7 pg (ref 27.2–33.4)
MCHC: 34.4 g/dL (ref 32.0–36.0)
MCV: 95 fL (ref 79.3–98.0)
MONO%: 9.2 % (ref 0.0–14.0)
RBC: 3.62 10*6/uL — ABNORMAL LOW (ref 4.20–5.82)
RDW: 14.6 % (ref 11.0–14.6)
lymph#: 1.2 10*3/uL (ref 0.9–3.3)
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
## 2010-11-16 NOTE — Op Note (Signed)
NAME:  Aaron Bruce, Aaron Bruce                           ACCOUNT NO.:  [id number]   MEDICAL RECORD NO.:  [id number]                   PATIENT TYPE:  INP   LOCATION:  X009                                 FACILITY:  [facility]   PHYSICIAN:  Lucrezia Starch. Earlene Plater, M.D.               DATE OF BIRTH:  May 04, 1925   DATE OF PROCEDURE:  05/25/2002  DATE OF DISCHARGE:                                 OPERATIVE REPORT   PREOPERATIVE DIAGNOSIS:  Bladder outlet obstruction/benign prostatic  hypertrophy.   POSTOPERATIVE DIAGNOSIS:  Bladder outlet obstruction/benign prostatic  hypertrophy.   PROCEDURE:  1. Cystoscopy.  2. Transurethral resection of the prostate.   ANESTHESIA:  General.   SURGEON:  Ronald L. Earlene Plater, M.D.   ASSISTANT:  Melvyn Novas, M.D.   ESTIMATED BLOOD LOSS:  Minimal.   SPECIMENS:  Prostate chips.   DRAINS:  Foley catheter.   COMPLICATIONS:  None.   BRIEF HISTORY:  [name] is a very nice 77 year old white male, with a  history of bladder outlet obstruction symptoms which have not responded to  medical therapy.  He has had urodynamics which were consistent with bladder  outlet obstruction.  After discussion of the surgical options for management  of bladder outlet obstruction, [name] opted for transurethral resection  of the prostate.   DESCRIPTION OF PROCEDURE:  Following the administration of IV antibiotics  and anesthesia, [name] was prepped and draped in the dorsal lithotomy  position.  Standard cystoscopy was performed using 22 French sheath and 30  and degree lens.  The bladder was 1+ trabeculated throughout.  Both ureteral  orifices were identified in their anatomic positions on the trigone.  There  were no mucosal abnormalities noted.  The cystoscope was then removed.  The  urethra was dilated systematically to 30 [place] using [employer].  The  resectoscope sheath was then placed using an obturator without difficulty.  The resectoscope was then placed through  the sheath.  A 12 degree lens was  used for resection.  Using the cutting current, we began the resection at 6  o'clock at the bladder neck.  The middle lobe was resected systemically at 6  o'clock from the bladder neck to the verumontanum, down to the circular  fibers of the surgical capsule.  We then turned our attention to the  patient's left lateral lobe which was systemically resected from the 1  o'clock position down to 6 o'clock from the bladder neck to the verumontanum  and down to the surgical capsule.  This was repeated on the contralateral  side.  The hanging curtain of tissue at 12 o'clock was then resected in a  similar fashion.  The prostate chips were then evacuated from the bladder  using an irrigating syringe.  The bladder emptied.  The prostatic urethra  was again inspected, and any additional adenoma was resected.  All sites of  venous bleeding  were fulgurated using the coagulation current.  There was no  other evidence of any bleeding.  The resectoscope was then removed.  A 22  French Foley catheter was then placed.  The bladder was irrigated.  The  urine was light pink in quality.  The catheter irrigated freely  quantitatively.  The procedure was then terminated after placing the  catheter on gentle rubber band traction.   DISPOSITION:  The patient was taken to the recovery room in stable  condition.     Melvyn Novas, M.D.                      Ronald L. Earlene Plater, M.D.    DK/MEDQ  D:  05/25/2002  T:  05/25/2002  Job:  [id number]

## 2010-11-29 ENCOUNTER — Other Ambulatory Visit: Payer: Self-pay | Admitting: Oncology

## 2010-11-29 ENCOUNTER — Encounter (HOSPITAL_BASED_OUTPATIENT_CLINIC_OR_DEPARTMENT_OTHER): Payer: Medicare Other | Admitting: Oncology

## 2010-11-29 DIAGNOSIS — D539 Nutritional anemia, unspecified: Secondary | ICD-10-CM

## 2010-11-29 DIAGNOSIS — D696 Thrombocytopenia, unspecified: Secondary | ICD-10-CM

## 2010-11-29 DIAGNOSIS — R609 Edema, unspecified: Secondary | ICD-10-CM

## 2010-11-29 DIAGNOSIS — D46C Myelodysplastic syndrome with isolated del(5q) chromosomal abnormality: Secondary | ICD-10-CM

## 2010-11-29 LAB — CBC WITH DIFFERENTIAL/PLATELET
EOS%: 3.9 % (ref 0.0–7.0)
Eosinophils Absolute: 0.2 10*3/uL (ref 0.0–0.5)
HGB: 11.4 g/dL — ABNORMAL LOW (ref 13.0–17.1)
MCV: 96.1 fL (ref 79.3–98.0)
MONO%: 10.7 % (ref 0.0–14.0)
NEUT#: 2.5 10*3/uL (ref 1.5–6.5)
RBC: 3.45 10*6/uL — ABNORMAL LOW (ref 4.20–5.82)
RDW: 15.4 % — ABNORMAL HIGH (ref 11.0–14.6)
lymph#: 1.2 10*3/uL (ref 0.9–3.3)

## 2010-12-20 ENCOUNTER — Other Ambulatory Visit: Payer: Self-pay | Admitting: Oncology

## 2010-12-20 ENCOUNTER — Encounter (HOSPITAL_BASED_OUTPATIENT_CLINIC_OR_DEPARTMENT_OTHER): Payer: Medicare Other | Admitting: Oncology

## 2010-12-20 DIAGNOSIS — D696 Thrombocytopenia, unspecified: Secondary | ICD-10-CM

## 2010-12-20 DIAGNOSIS — D539 Nutritional anemia, unspecified: Secondary | ICD-10-CM

## 2010-12-20 DIAGNOSIS — D46C Myelodysplastic syndrome with isolated del(5q) chromosomal abnormality: Secondary | ICD-10-CM

## 2010-12-20 DIAGNOSIS — R609 Edema, unspecified: Secondary | ICD-10-CM

## 2010-12-20 LAB — CBC WITH DIFFERENTIAL/PLATELET
Basophils Absolute: 0 10*3/uL (ref 0.0–0.1)
Eosinophils Absolute: 0.2 10*3/uL (ref 0.0–0.5)
HGB: 12.1 g/dL — ABNORMAL LOW (ref 13.0–17.1)
MONO#: 0.3 10*3/uL (ref 0.1–0.9)
NEUT#: 2 10*3/uL (ref 1.5–6.5)
Platelets: 121 10*3/uL — ABNORMAL LOW (ref 140–400)
RBC: 3.69 10*6/uL — ABNORMAL LOW (ref 4.20–5.82)
RDW: 15.6 % — ABNORMAL HIGH (ref 11.0–14.6)
WBC: 3.8 10*3/uL — ABNORMAL LOW (ref 4.0–10.3)

## 2011-01-18 ENCOUNTER — Other Ambulatory Visit: Payer: Self-pay | Admitting: Oncology

## 2011-01-18 ENCOUNTER — Encounter (HOSPITAL_BASED_OUTPATIENT_CLINIC_OR_DEPARTMENT_OTHER): Payer: Medicare Other | Admitting: Oncology

## 2011-01-18 DIAGNOSIS — D46C Myelodysplastic syndrome with isolated del(5q) chromosomal abnormality: Secondary | ICD-10-CM

## 2011-01-18 DIAGNOSIS — D539 Nutritional anemia, unspecified: Secondary | ICD-10-CM

## 2011-01-18 DIAGNOSIS — D696 Thrombocytopenia, unspecified: Secondary | ICD-10-CM

## 2011-01-18 DIAGNOSIS — R609 Edema, unspecified: Secondary | ICD-10-CM

## 2011-01-18 LAB — COMPREHENSIVE METABOLIC PANEL
BUN: 25 mg/dL — ABNORMAL HIGH (ref 6–23)
CO2: 27 mEq/L (ref 19–32)
Creatinine, Ser: 1.03 mg/dL (ref 0.50–1.35)
Glucose, Bld: 191 mg/dL — ABNORMAL HIGH (ref 70–99)
Sodium: 137 mEq/L (ref 135–145)
Total Bilirubin: 0.3 mg/dL (ref 0.3–1.2)
Total Protein: 6.5 g/dL (ref 6.0–8.3)

## 2011-01-18 LAB — CBC WITH DIFFERENTIAL/PLATELET
Basophils Absolute: 0.1 10*3/uL (ref 0.0–0.1)
Eosinophils Absolute: 0.2 10*3/uL (ref 0.0–0.5)
HCT: 36.1 % — ABNORMAL LOW (ref 38.4–49.9)
HGB: 11.9 g/dL — ABNORMAL LOW (ref 13.0–17.1)
LYMPH%: 27.5 % (ref 14.0–49.0)
MONO#: 0.5 10*3/uL (ref 0.1–0.9)
NEUT#: 2.8 10*3/uL (ref 1.5–6.5)
NEUT%: 57 % (ref 39.0–75.0)
Platelets: 113 10*3/uL — ABNORMAL LOW (ref 140–400)
WBC: 4.9 10*3/uL (ref 4.0–10.3)

## 2011-02-14 ENCOUNTER — Other Ambulatory Visit: Payer: Self-pay | Admitting: Oncology

## 2011-02-14 ENCOUNTER — Encounter (HOSPITAL_BASED_OUTPATIENT_CLINIC_OR_DEPARTMENT_OTHER): Payer: Medicare Other | Admitting: Oncology

## 2011-02-14 DIAGNOSIS — D539 Nutritional anemia, unspecified: Secondary | ICD-10-CM

## 2011-02-14 DIAGNOSIS — D46C Myelodysplastic syndrome with isolated del(5q) chromosomal abnormality: Secondary | ICD-10-CM

## 2011-02-14 DIAGNOSIS — R609 Edema, unspecified: Secondary | ICD-10-CM

## 2011-02-14 DIAGNOSIS — D696 Thrombocytopenia, unspecified: Secondary | ICD-10-CM

## 2011-02-14 LAB — CBC WITH DIFFERENTIAL/PLATELET
Eosinophils Absolute: 0.3 10*3/uL (ref 0.0–0.5)
LYMPH%: 30.8 % (ref 14.0–49.0)
MONO#: 0.5 10*3/uL (ref 0.1–0.9)
NEUT#: 2.5 10*3/uL (ref 1.5–6.5)
Platelets: 121 10*3/uL — ABNORMAL LOW (ref 140–400)
RBC: 3.69 10*6/uL — ABNORMAL LOW (ref 4.20–5.82)
WBC: 4.8 10*3/uL (ref 4.0–10.3)

## 2011-03-14 ENCOUNTER — Other Ambulatory Visit: Payer: Self-pay | Admitting: Oncology

## 2011-03-14 ENCOUNTER — Encounter (HOSPITAL_BASED_OUTPATIENT_CLINIC_OR_DEPARTMENT_OTHER): Payer: Medicare Other | Admitting: Oncology

## 2011-03-14 DIAGNOSIS — D696 Thrombocytopenia, unspecified: Secondary | ICD-10-CM

## 2011-03-14 DIAGNOSIS — R609 Edema, unspecified: Secondary | ICD-10-CM

## 2011-03-14 DIAGNOSIS — D46C Myelodysplastic syndrome with isolated del(5q) chromosomal abnormality: Secondary | ICD-10-CM

## 2011-03-14 DIAGNOSIS — D539 Nutritional anemia, unspecified: Secondary | ICD-10-CM

## 2011-03-14 LAB — CBC WITH DIFFERENTIAL/PLATELET
BASO%: 0.9 % (ref 0.0–2.0)
Basophils Absolute: 0 10*3/uL (ref 0.0–0.1)
EOS%: 4.4 % (ref 0.0–7.0)
Eosinophils Absolute: 0.2 10*3/uL (ref 0.0–0.5)
HCT: 34 % — ABNORMAL LOW (ref 38.4–49.9)
MCHC: 34.1 g/dL (ref 32.0–36.0)
MONO#: 0.6 10*3/uL (ref 0.1–0.9)
MONO%: 10.7 % (ref 0.0–14.0)
NEUT#: 3.2 10*3/uL (ref 1.5–6.5)
Platelets: 112 10*3/uL — ABNORMAL LOW (ref 140–400)
WBC: 5.3 10*3/uL (ref 4.0–10.3)

## 2011-03-14 LAB — COMPREHENSIVE METABOLIC PANEL
ALT: 13 U/L (ref 0–53)
AST: 12 U/L (ref 0–37)
Albumin: 4.4 g/dL (ref 3.5–5.2)
Alkaline Phosphatase: 64 U/L (ref 39–117)
BUN: 26 mg/dL — ABNORMAL HIGH (ref 6–23)
Calcium: 9.8 mg/dL (ref 8.4–10.5)
Chloride: 103 mEq/L (ref 96–112)
Potassium: 4.5 mEq/L (ref 3.5–5.3)
Sodium: 141 mEq/L (ref 135–145)
Total Protein: 6.2 g/dL (ref 6.0–8.3)

## 2011-05-02 ENCOUNTER — Encounter (HOSPITAL_BASED_OUTPATIENT_CLINIC_OR_DEPARTMENT_OTHER): Payer: Medicare Other | Admitting: Oncology

## 2011-05-02 ENCOUNTER — Other Ambulatory Visit: Payer: Self-pay | Admitting: Oncology

## 2011-05-02 DIAGNOSIS — R609 Edema, unspecified: Secondary | ICD-10-CM

## 2011-05-02 DIAGNOSIS — D539 Nutritional anemia, unspecified: Secondary | ICD-10-CM

## 2011-05-02 DIAGNOSIS — D469 Myelodysplastic syndrome, unspecified: Secondary | ICD-10-CM

## 2011-05-02 DIAGNOSIS — D46C Myelodysplastic syndrome with isolated del(5q) chromosomal abnormality: Secondary | ICD-10-CM

## 2011-05-02 DIAGNOSIS — D696 Thrombocytopenia, unspecified: Secondary | ICD-10-CM

## 2011-05-02 LAB — COMPREHENSIVE METABOLIC PANEL
ALT: 17 U/L (ref 0–53)
AST: 17 U/L (ref 0–37)
Albumin: 4.2 g/dL (ref 3.5–5.2)
Alkaline Phosphatase: 86 U/L (ref 39–117)
BUN: 21 mg/dL (ref 6–23)
Creatinine, Ser: 1.02 mg/dL (ref 0.50–1.35)
Potassium: 4.2 mEq/L (ref 3.5–5.3)

## 2011-05-02 LAB — CBC WITH DIFFERENTIAL/PLATELET
BASO%: 1.8 % (ref 0.0–2.0)
Basophils Absolute: 0.1 10*3/uL (ref 0.0–0.1)
EOS%: 3.9 % (ref 0.0–7.0)
HGB: 12.8 g/dL — ABNORMAL LOW (ref 13.0–17.1)
MCH: 33.4 pg (ref 27.2–33.4)
MCHC: 34.2 g/dL (ref 32.0–36.0)
MCV: 97.7 fL (ref 79.3–98.0)
MONO%: 11.3 % (ref 0.0–14.0)
RDW: 15.3 % — ABNORMAL HIGH (ref 11.0–14.6)
lymph#: 1.1 10*3/uL (ref 0.9–3.3)

## 2011-05-06 ENCOUNTER — Telehealth: Payer: Self-pay | Admitting: *Deleted

## 2011-05-06 NOTE — Telephone Encounter (Signed)
RECEIVED A FAX FROM [facility]CERNING A PRESCRIPTION REFILL REQUEST FOR REVLIMID. THIS REQUEST WAS GIVEN TO DR.SHERRILL'S NURSE, SUSAN COWARD,RN. 

## 2011-05-07 ENCOUNTER — Other Ambulatory Visit: Payer: Self-pay | Admitting: *Deleted

## 2011-05-07 ENCOUNTER — Encounter: Payer: Self-pay | Admitting: *Deleted

## 2011-05-07 NOTE — Progress Notes (Signed)
RECEIVED A FAX FROM [facility]CERNING A CONFIRMATION OF FACSIMILE RECEIPT. THIS NOTICE WAS GIVEN TO DR.SHERRILL'S NURSE, SUSAN COWARD,RN. 

## 2011-05-11 ENCOUNTER — Encounter: Payer: Self-pay | Admitting: *Deleted

## 2011-05-11 ENCOUNTER — Other Ambulatory Visit: Payer: Self-pay | Admitting: *Deleted

## 2011-05-11 MED ORDER — REVLIMID 5 MG PO CAPS: 5.0000 mg | ORAL_CAPSULE | ORAL | Status: DC

## 2011-06-06 ENCOUNTER — Encounter: Payer: Self-pay | Admitting: *Deleted

## 2011-06-06 NOTE — Progress Notes (Signed)
Refill request for Revlimid received from [facility].  Request to MD for review. 

## 2011-06-07 ENCOUNTER — Other Ambulatory Visit: Payer: Self-pay | Admitting: *Deleted

## 2011-06-07 MED ORDER — REVLIMID 5 MG PO CAPS: 5.0000 mg | ORAL_CAPSULE | ORAL | Status: DC

## 2011-06-07 NOTE — Progress Notes (Signed)
Received fax verification that [facility] has received script for pt and will ship upon insurance verification. dph

## 2011-06-14 ENCOUNTER — Other Ambulatory Visit: Payer: Self-pay | Admitting: *Deleted

## 2011-06-17 ENCOUNTER — Other Ambulatory Visit: Payer: Self-pay | Admitting: Oncology

## 2011-06-17 ENCOUNTER — Other Ambulatory Visit (HOSPITAL_BASED_OUTPATIENT_CLINIC_OR_DEPARTMENT_OTHER): Payer: Medicare Other | Admitting: Lab

## 2011-06-17 ENCOUNTER — Ambulatory Visit (HOSPITAL_BASED_OUTPATIENT_CLINIC_OR_DEPARTMENT_OTHER): Payer: Medicare Other | Admitting: Nurse Practitioner

## 2011-06-17 VITALS — BP 141/62 | HR 61 | Temp 97.6°F | Ht 66.0 in | Wt 164.8 lb

## 2011-06-17 DIAGNOSIS — D539 Nutritional anemia, unspecified: Secondary | ICD-10-CM

## 2011-06-17 DIAGNOSIS — D46C Myelodysplastic syndrome with isolated del(5q) chromosomal abnormality: Secondary | ICD-10-CM | POA: Insufficient documentation

## 2011-06-17 DIAGNOSIS — R609 Edema, unspecified: Secondary | ICD-10-CM

## 2011-06-17 DIAGNOSIS — D696 Thrombocytopenia, unspecified: Secondary | ICD-10-CM

## 2011-06-17 LAB — CBC WITH DIFFERENTIAL/PLATELET
BASO%: 0.9 % (ref 0.0–2.0)
Basophils Absolute: 0 10e3/uL (ref 0.0–0.1)
EOS%: 3.7 % (ref 0.0–7.0)
Eosinophils Absolute: 0.2 10e3/uL (ref 0.0–0.5)
HCT: 35.3 % — ABNORMAL LOW (ref 38.4–49.9)
HGB: 12 g/dL — ABNORMAL LOW (ref 13.0–17.1)
LYMPH%: 28.8 % (ref 14.0–49.0)
MCH: 32.8 pg (ref 27.2–33.4)
MCHC: 34 g/dL (ref 32.0–36.0)
MCV: 96.4 fL (ref 79.3–98.0)
MONO#: 0.6 10e3/uL (ref 0.1–0.9)
MONO%: 11.5 % (ref 0.0–14.0)
NEUT#: 2.8 10e3/uL (ref 1.5–6.5)
NEUT%: 55.1 % (ref 39.0–75.0)
Platelets: 98 10e3/uL — ABNORMAL LOW (ref 140–400)
RBC: 3.66 10e6/uL — ABNORMAL LOW (ref 4.20–5.82)
RDW: 15.4 % — ABNORMAL HIGH (ref 11.0–14.6)
WBC: 5.2 10e3/uL (ref 4.0–10.3)
lymph#: 1.5 10e3/uL (ref 0.9–3.3)

## 2011-06-17 NOTE — Progress Notes (Signed)
OFFICE PROGRESS NOTE  Interval history:  [name] is an 77 year old man with MDS. He is maintained on Revlimid 5 mg every other day. He is seen today for scheduled followup.  [name] reports that he injured his back about 3 weeks ago when pulling a lawnmower. The pain has improved significantly. No interim illnesses or infections. He denies fevers. No nausea or vomiting. He has a good appetite. No shortness of breath. He reports intermittent numbness of the left hand when exposed to cold.   Objective: Blood pressure 141/62, pulse 61, temperature 97.6 F (36.4 C), temperature source Oral, height 5\' 6"  (1.676 m), weight 164 lb 12.8 oz (74.753 kg).  Oropharynx is without thrush or ulceration. No palpable cervical, supraclavicular or axillary lymph nodes. Lungs are clear. No wheezes or rales. Regular cardiac rhythm. Abdomen soft and nontender. No organomegaly. 1+ pitting edema at the lower legs bilaterally. Vibratory sense is mildly to moderately decreased at the fingertips bilaterally.   Lab Results: Lab Results  Component Value Date   WBC 5.2 06/17/2011   HGB 12.0* 06/17/2011   HCT 35.3* 06/17/2011   MCV 96.4 06/17/2011   PLT 98* 06/17/2011       Studies/Results: No results found.  Medications: I have reviewed the patient's current medications.  Assessment/Plan:  1. 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  He began Revlimid 11/30/2009.  Revlimid was placed on hold 12/13/2009 when the absolute neutrophil count returned at 0.5. The Revlimid was resumed at a dose of 10 mg every other day on 12/21/2009.  The anemia improved.  Revlimid was decreased to 5 mg every other day  01/2010 due to neutropenia. 2. Probable cellulitis of the left lower posterior leg 12/13/2009, resolved following treatment with ciprofloxacin.   3. Left lower extremity edema and pain 12/13/2009 with a negative venous Doppler. 4. History of hypertension. 5. Diabetes. 6. History of  a cerebrovascular accident. 7. Hyperlipidemia. 8. Peripheral vascular disease. 9. History of neutropenia secondary to Revlimid, resolved. 10. Mild thrombocytopenia, likely secondary to Revlimid, stable. 11. Recent back injury-the pain is improving.  Disposition-Aaron Bruce is stable from a hematologic standpoint. He will continue Revlimid 5 mg every other day. He will return for an office visit and CBC in 6 weeks. He will contact the office in the interim with any problems.  Plan reviewed with Dr. Derenda Fennel, Misty Stanley ANP/GNP-BC

## 2011-07-05 ENCOUNTER — Other Ambulatory Visit: Payer: Self-pay | Admitting: *Deleted

## 2011-07-05 NOTE — Telephone Encounter (Signed)
THIS REQUEST WAS GIVEN TO DR.SHERRILL'S NURSE, SUSAN COWARD,RN.

## 2011-07-08 ENCOUNTER — Other Ambulatory Visit: Payer: Self-pay | Admitting: *Deleted

## 2011-07-08 ENCOUNTER — Encounter: Payer: Self-pay | Admitting: *Deleted

## 2011-07-08 MED ORDER — REVLIMID 5 MG PO CAPS: 5.0000 mg | ORAL_CAPSULE | ORAL | Status: DC

## 2011-07-08 NOTE — Progress Notes (Signed)
RECEIVED A FAX FROM [facility]ERNING A CONFIRMATION OF FACSIMILE RECEIPT. 

## 2011-07-09 ENCOUNTER — Other Ambulatory Visit: Payer: Self-pay | Admitting: Endocrinology

## 2011-07-09 DIAGNOSIS — M549 Dorsalgia, unspecified: Secondary | ICD-10-CM

## 2011-07-16 ENCOUNTER — Telehealth: Payer: Self-pay | Admitting: *Deleted

## 2011-07-16 NOTE — Telephone Encounter (Signed)
Patient called that he has received letter in mail from [employer] that he needs to document his income and assets again. Is requesting advice on how to do this. Informed him I will have managed care call him tomorrow, since they have helped him with this in the past.

## 2011-07-18 ENCOUNTER — Ambulatory Visit
Admission: RE | Admit: 2011-07-18 | Discharge: 2011-07-18 | Disposition: A | Discharge status: Home / Self Care | Payer: Medicare Other | Source: Ambulatory Visit | Attending: Endocrinology | Admitting: Endocrinology

## 2011-07-18 ENCOUNTER — Other Ambulatory Visit: Payer: Self-pay | Admitting: *Deleted

## 2011-07-18 DIAGNOSIS — M549 Dorsalgia, unspecified: Secondary | ICD-10-CM

## 2011-07-18 NOTE — Telephone Encounter (Signed)
Shipment delay due to no co pay assistance yet. Patient has completed all paperwork and are waiting to hear from [employer]. Awaiting approval. His co pay would be $2862.00 Patient is aware.

## 2011-07-23 ENCOUNTER — Other Ambulatory Visit (HOSPITAL_COMMUNITY): Payer: Self-pay | Admitting: Endocrinology

## 2011-07-23 DIAGNOSIS — IMO0002 Reserved for concepts with insufficient information to code with codable children: Secondary | ICD-10-CM

## 2011-07-24 ENCOUNTER — Encounter (HOSPITAL_COMMUNITY): Payer: Self-pay | Admitting: Pharmacy Technician

## 2011-07-29 ENCOUNTER — Ambulatory Visit (HOSPITAL_COMMUNITY)
Admission: RE | Admit: 2011-07-29 | Discharge: 2011-07-29 | Disposition: A | Discharge status: Home / Self Care | Payer: Medicare Other | Source: Ambulatory Visit | Attending: Endocrinology | Admitting: Endocrinology

## 2011-07-29 ENCOUNTER — Other Ambulatory Visit (HOSPITAL_COMMUNITY): Payer: Self-pay | Admitting: Interventional Radiology

## 2011-07-29 DIAGNOSIS — IMO0002 Reserved for concepts with insufficient information to code with codable children: Secondary | ICD-10-CM

## 2011-07-29 NOTE — Consult Note (Signed)
  Consult note.  Referring physician.   Claudie Fisherman MD.  Reason  for consultation.  #1 new onset severe low back pain due to two  compression fractures in the   lumbar region...  History of present illness.  78 year old right-handed gentleman who presents with severe low-back pain of 2 months duration.  The patient apparently experienced moderate degree of pain while bending over to work on his   lawnmower approximately 2 months ago..  Over the next few days the pain became increasingly severe to where  he describes his pain as being in the 8 over 10 range  despite taking analgesics.   Presently he describes his pain as being constant, with  severe exacerbations while coughing bending or walking. The pain when severe  is described as being sharp. He denies any radiation of this pain into his lower extremities on into his chest area. Denies any new autonomic dysfunction of his bowel or bladder.  His problems with frequency of micturition since his prostate surgery have remained unchanged.  He denies any changes in his appetite. Denies new recent chills or fever. He reports that he stopped taking pain medication because of excessive constipation. However he is unable to recollect the name of this medication.  Past medical history..  Hypertension. Diabetes. CVA. Hyperlipidemia. Peripheral vascular disease and myelodysplastic syndromefor which he is being seen by Dr. Alcide Evener.   Surgery on his prostate . Reports having kidney surgery for removal of stones .  Allergies. Denies allergies to Dye , latex, iodine, and penicillin.  Medications. He takes numerous medications. Most of these are homeopathic over-the-counter medications..  Other medications include Norvasc, Bisacodyl, Celebrex, glyburide, and Revlimid, metformin, and Micardis.  His social history.   Married with 2 children. Lives with his wife.  Denies tobacco or alcohol use.  Is a retired Optician, dispensing .   Family  History significant for heart disease in both parents and 2 siblings.  ROS Systems essentially negative for pathological symptomatology..  Examination.  Patient appeared alert awake oriented to time place space  including  obvious distress secondary to severe plane persisting slight movement of his back and legs . No lateralizing neurological  signs were detected otherwise.  Assessment..  The patient's MRI of the lumbosacral spine was reviewed with the patient and his sister-in-law. The MRIrevealed significant compression fractures at L3 and L4 both of which demonstrated mild retropulsion without compromise of the neural foramina.  The options of continued conservative management with pain relieving medications and limited activity versus vertebral body augmentation with methylmethacrylate mixture using the kyphoplasty and or  vertebroplasty technique to relieve pain, and to prevent further compression   deformity were discussed in detail.  Remote risks of infection and methylmethacrylate   leakage  were discussed in detail, as were ways to prevent these from happening.  The patient and the family  have  decided to proceed with the  vertebral body augmentation procedure. This has then scheduled for Wednesday I, January 31., They have been asked to call for any concerns or questions.  Dr Fatima Sanger MD

## 2011-07-30 ENCOUNTER — Telehealth: Payer: Self-pay | Admitting: Oncology

## 2011-07-30 ENCOUNTER — Other Ambulatory Visit: Payer: Self-pay | Admitting: Radiology

## 2011-07-30 NOTE — Telephone Encounter (Signed)
pt called and cancelled appt for 01/31.  pt stated that her will call back to r/s appt

## 2011-07-31 ENCOUNTER — Ambulatory Visit (HOSPITAL_COMMUNITY)
Admission: RE | Admit: 2011-07-31 | Discharge: 2011-07-31 | Disposition: A | Discharge status: Home / Self Care | Payer: Medicare Other | Source: Ambulatory Visit | Attending: Interventional Radiology | Admitting: Interventional Radiology

## 2011-07-31 ENCOUNTER — Telehealth: Payer: Self-pay | Admitting: *Deleted

## 2011-07-31 ENCOUNTER — Other Ambulatory Visit: Payer: Self-pay | Admitting: Interventional Radiology

## 2011-07-31 ENCOUNTER — Other Ambulatory Visit (HOSPITAL_COMMUNITY): Payer: Self-pay | Admitting: Interventional Radiology

## 2011-07-31 DIAGNOSIS — E785 Hyperlipidemia, unspecified: Secondary | ICD-10-CM | POA: Insufficient documentation

## 2011-07-31 DIAGNOSIS — I10 Essential (primary) hypertension: Secondary | ICD-10-CM | POA: Insufficient documentation

## 2011-07-31 DIAGNOSIS — D46C Myelodysplastic syndrome with isolated del(5q) chromosomal abnormality: Secondary | ICD-10-CM | POA: Insufficient documentation

## 2011-07-31 DIAGNOSIS — M1990 Unspecified osteoarthritis, unspecified site: Secondary | ICD-10-CM | POA: Insufficient documentation

## 2011-07-31 DIAGNOSIS — S32009A Unspecified fracture of unspecified lumbar vertebra, initial encounter for closed fracture: Secondary | ICD-10-CM | POA: Insufficient documentation

## 2011-07-31 DIAGNOSIS — X58XXXA Exposure to other specified factors, initial encounter: Secondary | ICD-10-CM | POA: Insufficient documentation

## 2011-07-31 DIAGNOSIS — I739 Peripheral vascular disease, unspecified: Secondary | ICD-10-CM | POA: Insufficient documentation

## 2011-07-31 DIAGNOSIS — IMO0002 Reserved for concepts with insufficient information to code with codable children: Secondary | ICD-10-CM

## 2011-07-31 DIAGNOSIS — M810 Age-related osteoporosis without current pathological fracture: Secondary | ICD-10-CM | POA: Insufficient documentation

## 2011-07-31 DIAGNOSIS — E119 Type 2 diabetes mellitus without complications: Secondary | ICD-10-CM | POA: Insufficient documentation

## 2011-07-31 DIAGNOSIS — K5900 Constipation, unspecified: Secondary | ICD-10-CM | POA: Insufficient documentation

## 2011-07-31 DIAGNOSIS — Z79899 Other long term (current) drug therapy: Secondary | ICD-10-CM | POA: Insufficient documentation

## 2011-07-31 DIAGNOSIS — Z8673 Personal history of transient ischemic attack (TIA), and cerebral infarction without residual deficits: Secondary | ICD-10-CM | POA: Insufficient documentation

## 2011-07-31 DIAGNOSIS — D696 Thrombocytopenia, unspecified: Secondary | ICD-10-CM | POA: Insufficient documentation

## 2011-07-31 LAB — CBC
HCT: 32.2 % — ABNORMAL LOW (ref 39.0–52.0)
Hemoglobin: 10.6 g/dL — ABNORMAL LOW (ref 13.0–17.0)
MCV: 96.4 fL (ref 78.0–100.0)
WBC: 5.5 10*3/uL (ref 4.0–10.5)

## 2011-07-31 LAB — BASIC METABOLIC PANEL
CO2: 22 mEq/L (ref 19–32)
Creatinine, Ser: 1.02 mg/dL (ref 0.50–1.35)
GFR calc Af Amer: 76 mL/min — ABNORMAL LOW (ref >90)
Sodium: 140 mEq/L (ref 135–145)

## 2011-07-31 LAB — GLUCOSE, CAPILLARY: Glucose-Capillary: 98 mg/dL (ref 70–99)

## 2011-07-31 LAB — PROTIME-INR: INR: 1.04 (ref 0.00–1.49)

## 2011-07-31 MED ORDER — CEFAZOLIN SODIUM 1-5 GM-% IV SOLN
INTRAVENOUS | Status: AC
  Filled 2011-07-31: qty 50

## 2011-07-31 MED ORDER — FENTANYL CITRATE 0.05 MG/ML IJ SOLN
INTRAMUSCULAR | Status: AC | PRN
  Administered 2011-07-31: 25 ug via INTRAVENOUS

## 2011-07-31 MED ORDER — HYDROMORPHONE HCL PF 1 MG/ML IJ SOLN
INTRAMUSCULAR | Status: AC
  Filled 2011-07-31: qty 1

## 2011-07-31 MED ORDER — FENTANYL CITRATE 0.05 MG/ML IJ SOLN
INTRAMUSCULAR | Status: AC
  Administered 2011-07-31: 50 ug
  Filled 2011-07-31: qty 4

## 2011-07-31 MED ORDER — SODIUM CHLORIDE 0.9 % IV SOLN: Freq: Once | INTRAVENOUS | Status: DC

## 2011-07-31 MED ORDER — SODIUM CHLORIDE 0.9 % IV SOLN
INTRAVENOUS | Status: AC
  Administered 2011-07-31: 13:00:00 via INTRAVENOUS

## 2011-07-31 MED ORDER — CEFAZOLIN SODIUM 1-5 GM-% IV SOLN
1.0000 g | Freq: Once | INTRAVENOUS | Status: AC
  Administered 2011-07-31: 1 g via INTRAVENOUS
  Filled 2011-07-31: qty 50

## 2011-07-31 MED ORDER — TOBRAMYCIN SULFATE 1.2 G IJ SOLR
INTRAMUSCULAR | Status: AC
  Filled 2011-07-31: qty 1.2

## 2011-07-31 MED ORDER — MIDAZOLAM HCL 2 MG/2ML IJ SOLN
INTRAMUSCULAR | Status: AC
  Administered 2011-07-31: 1 mg
  Filled 2011-07-31: qty 4

## 2011-07-31 NOTE — H&P (Signed)
Aaron Bruce is an 78 y.o. male.   Chief Complaint: low back pain HPI: Patient with history of new onset moderate to severe low back pain and evidence of acute compression fractures at L3 and L4 presents today for L3, L4 vertebroplasty/kyphoplasty.  Past Medical History  Diagnosis Date  . Anemia   . Hypertension   . Diabetes mellitus   . Stroke   . Hyperlipemia   . Peripheral vascular disease   . Arthritis     Osteoarthritis  . Irritable bowel syndrome (IBS)   . Myelodysplasia with 5q deletion   . Thrombocytopenia     stable--due to Revlimid    Past Surgical History  Procedure Date  . Tonsillectomy 1948  . Kidney stone surgery 1950's    No family history on file. Social History: married with 2 children, denies tobacco or alcohol use , retired Optician, dispensing  Allergies: No Known Allergies  Medications Prior to Admission  Medication Sig Dispense Refill  . amLODipine (NORVASC) 10 MG tablet Take 10 mg by mouth daily.        . Ascorbic Acid 1000 MG TBCR Take 1 tablet by mouth daily.        . B Complex Vitamins (VITAMIN B COMPLEX PO) Take 1 tablet by mouth daily.        Marland Kitchen BISACODYL PO Take 1 capsule by mouth at bedtime.        . Calcium Carbonate-Vitamin D (CALCIUM 600 + D PO) Take 1 tablet by mouth daily.        . celecoxib (CELEBREX) 200 MG capsule Take 200 mg by mouth daily.        . Chromium 200 MCG CAPS Take 1 capsule by mouth daily.       . Ergocalciferol 2000 UNITS TABS Take 1 tablet by mouth daily.        . Flaxseed, Linseed, (FLAXSEED OIL PO) Take 1 capsule by mouth 2 (two) times daily.       . Garlic 1000 MG CAPS Take 1 capsule by mouth daily.        Marland Kitchen glyBURIDE (DIABETA) 5 MG tablet Take 10 mg by mouth daily.       Marland Kitchen HM CINNAMON PO Take 1,000 mg by mouth 2 (two) times daily.        . Lecithin 1200 MG CAPS Take 1 capsule by mouth daily.        Marland Kitchen lenalidomide (REVLIMID) 5 MG capsule Take 5 mg by mouth every other day.      . Magnesium 250 MG TABS Take 1 tablet by mouth  daily.        . metFORMIN (GLUCOPHAGE) 1000 MG tablet Take 1,000 mg by mouth daily.       . Multiple Vitamins-Minerals (CENTRUM SILVER PO) Take 1 tablet by mouth daily.        . niacin 500 MG tablet Take 500 mg by mouth daily with breakfast.        . NUTRITIONAL SUPPLEMENTS PO Take by mouth daily. Wheatgrass, Mineral Powder, Prostate Formula, ArthroZyme Joint & Muscle, Ultimate BP, Natto-K, Ultimate Bone Support, Tri-Boron,Sustanol (testosterone supplement), Vitamin/herb for energy       . Omega-3 Fatty Acids (FISH OIL CONCENTRATE) 1000 MG CAPS Take 1 capsule by mouth daily.       . Selenium 200 MCG CAPS Take 1 capsule by mouth daily.        Marland Kitchen telmisartan (MICARDIS) 40 MG tablet Take 40 mg by mouth daily.        Marland Kitchen  Trospium Chloride (SANCTURA XR) 60 MG CP24 Take 60 mg by mouth daily.        . vitamin B-12 (CYANOCOBALAMIN) 1000 MCG tablet Take 1,000 mcg by mouth daily.         Medications Prior to Admission  Medication Dose Route Frequency Provider Last Rate Last Dose  . 0.9 %  sodium chloride infusion   Intravenous Once Robet Leu, PA      . ceFAZolin (ANCEF) 1-5 GM-% IVPB           . ceFAZolin (ANCEF) IVPB 1 g/50 mL premix  1 g Intravenous Once Robet Leu, PA      . fentaNYL (SUBLIMAZE) 0.05 MG/ML injection           . HYDROmorphone (DILAUDID) 1 MG/ML injection           . midazolam (VERSED) 2 MG/2ML injection           . tobramycin (NEBCIN) 1.2 G powder             Results for orders placed during the hospital encounter of 07/31/11 (from the past 48 hour(s))  APTT     Status: Normal   Collection Time   07/31/11  8:32 AM      Component Value Range Comment   aPTT 34  24 - 37 (seconds)   BASIC METABOLIC PANEL     Status: Abnormal   Collection Time   07/31/11  8:32 AM      Component Value Range Comment   Sodium 140  135 - 145 (mEq/L)    Potassium 4.6  3.5 - 5.1 (mEq/L)    Chloride 105  96 - 112 (mEq/L)    CO2 22  19 - 32 (mEq/L)    Glucose, Bld 171 (*) 70 - 99 (mg/dL)    BUN  26 (*) 6 - 23 (mg/dL)    Creatinine, Ser 9.14  0.50 - 1.35 (mg/dL)    Calcium 9.6  8.4 - 10.5 (mg/dL)    GFR calc non Af Amer 65 (*) >90 (mL/min)    GFR calc Af Amer 76 (*) >90 (mL/min)   CBC     Status: Abnormal   Collection Time   07/31/11  8:32 AM      Component Value Range Comment   WBC 5.5  4.0 - 10.5 (K/uL)    RBC 3.34 (*) 4.22 - 5.81 (MIL/uL)    Hemoglobin 10.6 (*) 13.0 - 17.0 (g/dL)    HCT 78.2 (*) 95.6 - 52.0 (%)    MCV 96.4  78.0 - 100.0 (fL)    MCH 31.7  26.0 - 34.0 (pg)    MCHC 32.9  30.0 - 36.0 (g/dL)    RDW 21.3  08.6 - 57.8 (%)    Platelets 153  150 - 400 (K/uL)   PROTIME-INR     Status: Normal   Collection Time   07/31/11  8:32 AM      Component Value Range Comment   Prothrombin Time 13.8  11.6 - 15.2 (seconds)    INR 1.04  0.00 - 1.49     No results found.  Review of Systems  Constitutional: Negative for fever and chills.  Respiratory: Positive for cough. Negative for shortness of breath.   Cardiovascular: Negative for chest pain.  Gastrointestinal: Negative for nausea, vomiting and abdominal pain.  Musculoskeletal: Positive for back pain.  Neurological: Negative for headaches.  Endo/Heme/Allergies: Does not bruise/bleed easily.    Blood pressure 118/61, pulse 55, temperature  97.1 F (36.2 C), temperature source Oral, resp. rate 16, height 5' 6.5" (1.689 m), weight 165 lb (74.844 kg), SpO2 91.00%. Physical Exam  Constitutional: He is oriented to person, place, and time. He appears well-developed and well-nourished.  Cardiovascular: Normal rate and regular rhythm.   Respiratory: Effort normal and breath sounds normal.  GI: Soft. Bowel sounds are normal. He exhibits no distension. There is no tenderness.  Musculoskeletal: Normal range of motion. He exhibits edema.       Trace bilateral pretibial edema; moderately tender lower paravertebral region  Neurological: He is alert and oriented to person, place, and time.     Assessment/Plan Patient with  symptomatic L3 and L4 compression fractures; plan is for L3/L4 vertebroplasty/kyphoplasty with possible biopsy.  Danell Vazquez,D KEVIN 07/31/2011, 9:21 AM

## 2011-07-31 NOTE — Progress Notes (Signed)
Ice pack to back. Clean dry diaper applied. Skin breakdown noted on buttocks. Instructed patient to call his primary doctor when he gets home for treatment.

## 2011-07-31 NOTE — Progress Notes (Signed)
D?C instructions reviewed with pt and family who both verbalized understanding.  Pt d/c home with family via wheelchair.

## 2011-07-31 NOTE — Procedures (Signed)
S/P Define KP at L3 and  Balloon  KP at L4. No acute complications.

## 2011-07-31 NOTE — Telephone Encounter (Signed)
Talked with pt's wife & she reports that pt has gone to [facility] today for "cement or something to his back" & will come back home this eve.  Reported to Dr Truett Perna.

## 2011-08-01 ENCOUNTER — Other Ambulatory Visit (HOSPITAL_COMMUNITY): Payer: Self-pay | Admitting: Interventional Radiology

## 2011-08-01 ENCOUNTER — Ambulatory Visit: Payer: Medicare Other | Admitting: Oncology

## 2011-08-01 ENCOUNTER — Telehealth (HOSPITAL_COMMUNITY): Payer: Self-pay

## 2011-08-01 ENCOUNTER — Other Ambulatory Visit: Payer: Medicare Other | Admitting: Lab

## 2011-08-01 DIAGNOSIS — Z09 Encounter for follow-up examination after completed treatment for conditions other than malignant neoplasm: Secondary | ICD-10-CM

## 2011-08-01 DIAGNOSIS — IMO0002 Reserved for concepts with insufficient information to code with codable children: Secondary | ICD-10-CM

## 2011-08-14 ENCOUNTER — Ambulatory Visit (HOSPITAL_COMMUNITY)
Admission: RE | Admit: 2011-08-14 | Discharge: 2011-08-14 | Disposition: A | Discharge status: Home / Self Care | Payer: Medicare Other | Source: Ambulatory Visit | Attending: Interventional Radiology | Admitting: Interventional Radiology

## 2011-08-14 ENCOUNTER — Other Ambulatory Visit: Payer: Self-pay | Admitting: *Deleted

## 2011-08-14 ENCOUNTER — Encounter: Payer: Self-pay | Admitting: *Deleted

## 2011-08-14 DIAGNOSIS — IMO0002 Reserved for concepts with insufficient information to code with codable children: Secondary | ICD-10-CM

## 2011-08-14 DIAGNOSIS — Z09 Encounter for follow-up examination after completed treatment for conditions other than malignant neoplasm: Secondary | ICD-10-CM

## 2011-08-14 DIAGNOSIS — D46C Myelodysplastic syndrome with isolated del(5q) chromosomal abnormality: Secondary | ICD-10-CM

## 2011-08-14 MED ORDER — LENALIDOMIDE 5 MG PO CAPS: 5.0000 mg | ORAL_CAPSULE | ORAL | Status: DC

## 2011-08-14 NOTE — Progress Notes (Signed)
[facility] faxed refill request for revlimid.  Request to MD for review. 

## 2011-08-15 ENCOUNTER — Telehealth: Payer: Self-pay | Admitting: *Deleted

## 2011-08-15 NOTE — Telephone Encounter (Signed)
Received fax confirmation from [facility] that referral received & will f/u once insurance verified & make delivery arrangements.

## 2011-08-29 ENCOUNTER — Telehealth: Payer: Self-pay | Admitting: *Deleted

## 2011-08-29 ENCOUNTER — Other Ambulatory Visit: Payer: Self-pay | Admitting: *Deleted

## 2011-08-29 NOTE — Telephone Encounter (Signed)
Patient called to request to reschedule the appointment he missed due to having a procedure. POF to scheduler.

## 2011-08-30 ENCOUNTER — Telehealth: Payer: Self-pay | Admitting: Oncology

## 2011-08-30 NOTE — Telephone Encounter (Signed)
called pt to schedule appt for 03/12 ad pt stated that he could not come scheduled appt for 03/15

## 2011-09-04 ENCOUNTER — Telehealth (HOSPITAL_COMMUNITY): Payer: Self-pay

## 2011-09-10 ENCOUNTER — Ambulatory Visit: Payer: Medicare Other | Admitting: Nurse Practitioner

## 2011-09-12 ENCOUNTER — Other Ambulatory Visit: Payer: Self-pay | Admitting: *Deleted

## 2011-09-12 DIAGNOSIS — D46C Myelodysplastic syndrome with isolated del(5q) chromosomal abnormality: Secondary | ICD-10-CM

## 2011-09-12 NOTE — Telephone Encounter (Signed)
THIS REQUEST WAS GIVEN TO DR.SHERRILL'S NURSE, TANYA WHITLOCK,RN. 

## 2011-09-13 ENCOUNTER — Ambulatory Visit (HOSPITAL_BASED_OUTPATIENT_CLINIC_OR_DEPARTMENT_OTHER): Payer: Medicare Other | Admitting: Nurse Practitioner

## 2011-09-13 ENCOUNTER — Other Ambulatory Visit (HOSPITAL_BASED_OUTPATIENT_CLINIC_OR_DEPARTMENT_OTHER): Payer: Medicare Other

## 2011-09-13 ENCOUNTER — Other Ambulatory Visit: Payer: Self-pay | Admitting: *Deleted

## 2011-09-13 ENCOUNTER — Telehealth: Payer: Self-pay | Admitting: Oncology

## 2011-09-13 VITALS — BP 139/65 | HR 78 | Temp 97.0°F | Ht 66.5 in | Wt 162.2 lb

## 2011-09-13 DIAGNOSIS — D696 Thrombocytopenia, unspecified: Secondary | ICD-10-CM

## 2011-09-13 DIAGNOSIS — D46C Myelodysplastic syndrome with isolated del(5q) chromosomal abnormality: Secondary | ICD-10-CM

## 2011-09-13 LAB — CBC WITH DIFFERENTIAL/PLATELET
Basophils Absolute: 0.1 10*3/uL (ref 0.0–0.1)
EOS%: 4.8 % (ref 0.0–7.0)
Eosinophils Absolute: 0.2 10*3/uL (ref 0.0–0.5)
HCT: 33.6 % — ABNORMAL LOW (ref 38.4–49.9)
HGB: 11.4 g/dL — ABNORMAL LOW (ref 13.0–17.1)
MONO#: 0.5 10*3/uL (ref 0.1–0.9)
NEUT#: 1.4 10*3/uL — ABNORMAL LOW (ref 1.5–6.5)
NEUT%: 37.2 % — ABNORMAL LOW (ref 39.0–75.0)
RDW: 15.8 % — ABNORMAL HIGH (ref 11.0–14.6)
WBC: 3.8 10*3/uL — ABNORMAL LOW (ref 4.0–10.3)
lymph#: 1.6 10*3/uL (ref 0.9–3.3)

## 2011-09-13 MED ORDER — LENALIDOMIDE 5 MG PO CAPS: 5.0000 mg | ORAL_CAPSULE | ORAL | Status: DC

## 2011-09-13 NOTE — Telephone Encounter (Signed)
appts made and printed for pt  °

## 2011-09-13 NOTE — Progress Notes (Signed)
OFFICE PROGRESS NOTE  Interval history:  Aaron Bruce is an 78 year old man with MDS. He is maintained on Revlimid 5 mg every other day. He is seen today after missing a followup visit in January. He underwent a kyphoplasty procedure on 07/31/2011 for compression fractures at L3 and L4. The back pain resolved within 3-4 days of the procedure. He is slowly increasing his activity level. He has a good appetite. He denies nausea/vomiting. No mouth sores. No constipation. He takes a laxative as needed. He denies numbness or tingling in his hands or feet.    Objective: Blood pressure 139/65, pulse 78, temperature 97 F (36.1 C), temperature source Oral, height 5' 6.5" (1.689 m), weight 162 lb 3.2 oz (73.573 kg).  Oropharynx is without thrush or ulceration. Lungs are clear. Regular cardiac rhythm. Abdomen is soft and nontender. 1+ pitting edema at the lower legs bilaterally.  Lab Results: Lab Results  Component Value Date   WBC 3.8* 09/13/2011   HGB 11.4* 09/13/2011   HCT 33.6* 09/13/2011   MCV 95.5 09/13/2011   PLT 101* 09/13/2011    Chemistry:    Chemistry      Component Value Date/Time   NA 140 07/31/2011 0832   K 4.6 07/31/2011 0832   CL 105 07/31/2011 0832   CO2 22 07/31/2011 0832   BUN 26* 07/31/2011 0832   CREATININE 1.02 07/31/2011 0832      Component Value Date/Time   CALCIUM 9.6 07/31/2011 0832   ALKPHOS 86 05/02/2011 1519   ALKPHOS 86 05/02/2011 1519   AST 17 05/02/2011 1519   AST 17 05/02/2011 1519   ALT 17 05/02/2011 1519   ALT 17 05/02/2011 1519   BILITOT 0.3 05/02/2011 1519   BILITOT 0.3 05/02/2011 1519       Studies/Results: No results found.  Medications: I have reviewed the patient's current medications.  Assessment/Plan:  1. 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 He began Revlimid 11/30/2009. Revlimid was placed on hold 12/13/2009 when the absolute neutrophil count returned at 0.5. The Revlimid was resumed at a dose of 10 mg every other day  on 12/21/2009. The anemia improved. Revlimid was decreased to 5 mg every other day 01/2010 due to neutropenia. 2. Probable cellulitis of the left lower posterior leg 12/13/2009, resolved following treatment with ciprofloxacin.  3. Left lower extremity edema and pain 12/13/2009 with a negative venous Doppler. 4. History of hypertension. 5. Diabetes. 6. History of a cerebrovascular accident. 7. Hyperlipidemia. 8. Peripheral vascular disease. 9. History of neutropenia secondary to Revlimid. 10. Mild thrombocytopenia, likely secondary to Revlimid, stable. 11. Status post kyphoplasty L3 and L4 on 07/31/2011 for compression fractures.  Disposition-[name] appears stable. He will continue Revlimid at the current dose and schedule. He will return for a followup visit and CBC in 6 weeks. He will contact the office in the interim with any problems.  Plan reviewed with Dr. Truett Perna.  Lonna Cobb ANP/GNP-BC

## 2011-09-20 ENCOUNTER — Encounter: Payer: Self-pay | Admitting: *Deleted

## 2011-09-20 NOTE — Progress Notes (Signed)
ON 09/13/11 RECEIVED A FAX FROM [facility] CONCERNING A CONFIRMATION OF FACSIMILE RECEIPT. ON 09/16/11 RECEIVED A FAX FROM [facility] CONCERNING A CONFIRMATION OF PRESCRIPTION SHIPMENT FOR REVLIMID.

## 2011-10-14 ENCOUNTER — Other Ambulatory Visit: Payer: Self-pay | Admitting: *Deleted

## 2011-10-14 DIAGNOSIS — D46C Myelodysplastic syndrome with isolated del(5q) chromosomal abnormality: Secondary | ICD-10-CM

## 2011-10-14 MED ORDER — LENALIDOMIDE 5 MG PO CAPS: 5.0000 mg | ORAL_CAPSULE | ORAL | Status: DC

## 2011-10-14 NOTE — Telephone Encounter (Addendum)
THIS REQUEST FOR REVLIMID WAS GIVEN TO DR.SHERRILL'S NURSE, SUSAN COWARD,RN FOR MD REVIEW.

## 2011-10-25 ENCOUNTER — Ambulatory Visit (HOSPITAL_BASED_OUTPATIENT_CLINIC_OR_DEPARTMENT_OTHER): Payer: Medicare Other | Admitting: Oncology

## 2011-10-25 ENCOUNTER — Other Ambulatory Visit (HOSPITAL_BASED_OUTPATIENT_CLINIC_OR_DEPARTMENT_OTHER): Payer: Medicare Other | Admitting: Lab

## 2011-10-25 ENCOUNTER — Telehealth: Payer: Self-pay | Admitting: Oncology

## 2011-10-25 VITALS — BP 118/61 | HR 85 | Temp 97.0°F | Ht 66.5 in | Wt 162.2 lb

## 2011-10-25 DIAGNOSIS — D709 Neutropenia, unspecified: Secondary | ICD-10-CM

## 2011-10-25 DIAGNOSIS — D696 Thrombocytopenia, unspecified: Secondary | ICD-10-CM

## 2011-10-25 DIAGNOSIS — D649 Anemia, unspecified: Secondary | ICD-10-CM

## 2011-10-25 DIAGNOSIS — D46C Myelodysplastic syndrome with isolated del(5q) chromosomal abnormality: Secondary | ICD-10-CM

## 2011-10-25 LAB — CBC WITH DIFFERENTIAL/PLATELET
BASO%: 2 % (ref 0.0–2.0)
EOS%: 3.7 % (ref 0.0–7.0)
MCH: 33.6 pg — ABNORMAL HIGH (ref 27.2–33.4)
MCHC: 34.8 g/dL (ref 32.0–36.0)
MCV: 96.6 fL (ref 79.3–98.0)
MONO%: 13.1 % (ref 0.0–14.0)
RBC: 3.51 10*6/uL — ABNORMAL LOW (ref 4.20–5.82)
RDW: 14.7 % — ABNORMAL HIGH (ref 11.0–14.6)

## 2011-10-25 NOTE — Progress Notes (Signed)
OFFICE PROGRESS NOTE   INTERVAL HISTORY:   He returns as scheduled. He continues Revlimid. No complaint.  Objective:  Vital signs in last 24 hours:  Blood pressure 118/61, pulse 85, temperature 97 F (36.1 C), temperature source Oral, height 5' 6.5" (1.689 m), weight 162 lb 3.2 oz (73.573 kg).    HEENT: No thrush or ulcers Resp: Lungs clear bilaterally Cardio: Regular rate and rhythm GI: Nontender, no hepatosplenomegaly Vascular: No leg edema   Lab Results:  Lab Results  Component Value Date   WBC 4.6 10/25/2011   HGB 11.8* 10/25/2011   HCT 33.9* 10/25/2011   MCV 96.6 10/25/2011   PLT 102* 10/25/2011   ANC 1.7   Medications: I have reviewed the patient's current medications.  Assessment/Plan: 1. 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 He began Revlimid 11/30/2009. Revlimid was placed on hold 12/13/2009 when the absolute neutrophil count returned at 0.5. The Revlimid was resumed at a dose of 10 mg every other day on 12/21/2009. The anemia improved. Revlimid was decreased to 5 mg every other day 01/2010 due to neutropenia. 2. Probable cellulitis of the left lower posterior leg 12/13/2009, resolved following treatment with ciprofloxacin.  3. Left lower extremity edema and pain 12/13/2009 with a negative venous Doppler. 4. History of hypertension. 5. Diabetes. 6. History of a cerebrovascular accident. 7. Hyperlipidemia. 8. Peripheral vascular disease. 9. History of neutropenia secondary to Revlimid. 10. Mild thrombocytopenia, likely secondary to Revlimid, stable. 11. Status post kyphoplasty L3 and L4 on 07/31/2011 for compression fractures.   Disposition:  He appears stable from a hematologic standpoint. He will continue Revlimid every other day. He will return for a CBC in 2 months. He is scheduled for an office visit in 4 months.   Thornton Papas, MD  10/25/2011  4:57 PM

## 2011-10-25 NOTE — Telephone Encounter (Signed)
Gave pt appt calendar  for June lab only and end of August for lab and MD

## 2011-11-11 ENCOUNTER — Other Ambulatory Visit: Payer: Self-pay

## 2011-11-11 DIAGNOSIS — D46C Myelodysplastic syndrome with isolated del(5q) chromosomal abnormality: Secondary | ICD-10-CM

## 2011-11-11 MED ORDER — LENALIDOMIDE 5 MG PO CAPS: ORAL_CAPSULE | ORAL | Status: DC

## 2011-11-11 NOTE — Telephone Encounter (Signed)
RECEIVED A FAX FROM [facility] CONCERNING A CONFIRMATION OF FACSIMILE RECEIPT. 

## 2011-11-14 NOTE — Telephone Encounter (Signed)
RECEIVED A FAX FROM [facility] CONCERNING A CONFIRMATION OF PRESCRIPTION SHIPMENT. 

## 2011-12-10 ENCOUNTER — Other Ambulatory Visit: Payer: Self-pay | Admitting: *Deleted

## 2011-12-10 DIAGNOSIS — D46C Myelodysplastic syndrome with isolated del(5q) chromosomal abnormality: Secondary | ICD-10-CM

## 2011-12-10 MED ORDER — LENALIDOMIDE 5 MG PO CAPS: ORAL_CAPSULE | ORAL | Status: DC

## 2011-12-10 NOTE — Telephone Encounter (Signed)
Addended by: Arvilla Meres on: 12/10/2011 04:02 PM   Modules accepted: Orders

## 2011-12-10 NOTE — Telephone Encounter (Signed)
THIS REFILL REQUEST WAS GIVEN TO DR.SHERRILL'S NURSE, SUSAN COWARD,RN. 

## 2011-12-11 ENCOUNTER — Telehealth: Payer: Self-pay | Admitting: Medical Oncology

## 2011-12-11 NOTE — Telephone Encounter (Signed)
Fax receipt.

## 2011-12-12 ENCOUNTER — Telehealth: Payer: Self-pay | Admitting: Emergency Medicine

## 2011-12-12 NOTE — Telephone Encounter (Signed)
Revlimid to be delivered 12/12/11

## 2011-12-20 ENCOUNTER — Other Ambulatory Visit (HOSPITAL_BASED_OUTPATIENT_CLINIC_OR_DEPARTMENT_OTHER): Payer: Medicare Other | Admitting: Lab

## 2011-12-20 DIAGNOSIS — D46C Myelodysplastic syndrome with isolated del(5q) chromosomal abnormality: Secondary | ICD-10-CM

## 2011-12-20 LAB — CBC WITH DIFFERENTIAL/PLATELET
Eosinophils Absolute: 0.2 10*3/uL (ref 0.0–0.5)
MCV: 102.7 fL — ABNORMAL HIGH (ref 79.3–98.0)
MONO#: 0.5 10*3/uL (ref 0.1–0.9)
MONO%: 11.6 % (ref 0.0–14.0)
NEUT#: 2.2 10*3/uL (ref 1.5–6.5)
RBC: 3.12 10*6/uL — ABNORMAL LOW (ref 4.20–5.82)
RDW: 14.7 % — ABNORMAL HIGH (ref 11.0–14.6)
WBC: 4.7 10*3/uL (ref 4.0–10.3)
lymph#: 1.5 10*3/uL (ref 0.9–3.3)

## 2012-01-09 ENCOUNTER — Other Ambulatory Visit: Payer: Self-pay | Admitting: *Deleted

## 2012-01-09 DIAGNOSIS — D46C Myelodysplastic syndrome with isolated del(5q) chromosomal abnormality: Secondary | ICD-10-CM

## 2012-01-09 MED ORDER — LENALIDOMIDE 5 MG PO CAPS: ORAL_CAPSULE | ORAL | Status: DC

## 2012-01-09 NOTE — Telephone Encounter (Signed)
THIS REFILL REQUEST FOR REVLIMID WAS GIVEN TO DR.SHERRILL'S NURSE, SUSAN COWARD,RN. 

## 2012-01-09 NOTE — Telephone Encounter (Signed)
RECEIVED A FAX FROM [facility] CONCERNING A CONFIRMATION OF [facility] RECEIPT OF PT. 'S REFERRAL. 

## 2012-01-14 NOTE — Telephone Encounter (Signed)
RECEIVED A FAX FROM [facility] CONCERNING A CONFIRMATION OF PRESCRIPTION SHIPMENT FOR REVLIMID. 

## 2012-02-06 ENCOUNTER — Telehealth: Payer: Self-pay | Admitting: *Deleted

## 2012-02-06 NOTE — Telephone Encounter (Signed)
[facility] faxed Revlimid refill request.  Request to MD for review.  

## 2012-02-11 ENCOUNTER — Other Ambulatory Visit: Payer: Self-pay | Admitting: *Deleted

## 2012-02-11 DIAGNOSIS — D46C Myelodysplastic syndrome with isolated del(5q) chromosomal abnormality: Secondary | ICD-10-CM

## 2012-02-11 MED ORDER — LENALIDOMIDE 5 MG PO CAPS: ORAL_CAPSULE | ORAL | Status: DC

## 2012-02-11 NOTE — Telephone Encounter (Signed)
RECEIVED A FAX FROM [facility] CONCERNING A CONFIRMATION OF FACSIMILE RECEIPT FOR PT.'S REFERRAL. 

## 2012-02-13 NOTE — Telephone Encounter (Signed)
RECEIVED A FAX FROM [facility] CONCERNING A CONFIRMATION OF PRESCRIPTION SHIPMENT FOR REVLIMID ON 02/12/12.

## 2012-02-28 ENCOUNTER — Telehealth: Payer: Self-pay | Admitting: Oncology

## 2012-02-28 ENCOUNTER — Ambulatory Visit (HOSPITAL_BASED_OUTPATIENT_CLINIC_OR_DEPARTMENT_OTHER): Payer: Medicare Other | Admitting: Nurse Practitioner

## 2012-02-28 ENCOUNTER — Other Ambulatory Visit (HOSPITAL_BASED_OUTPATIENT_CLINIC_OR_DEPARTMENT_OTHER): Payer: Medicare Other | Admitting: Lab

## 2012-02-28 VITALS — BP 110/59 | HR 82 | Temp 99.0°F | Resp 18 | Ht 66.5 in | Wt 159.5 lb

## 2012-02-28 DIAGNOSIS — D72819 Decreased white blood cell count, unspecified: Secondary | ICD-10-CM

## 2012-02-28 DIAGNOSIS — D46C Myelodysplastic syndrome with isolated del(5q) chromosomal abnormality: Secondary | ICD-10-CM

## 2012-02-28 DIAGNOSIS — D649 Anemia, unspecified: Secondary | ICD-10-CM

## 2012-02-28 DIAGNOSIS — D696 Thrombocytopenia, unspecified: Secondary | ICD-10-CM

## 2012-02-28 LAB — CBC WITH DIFFERENTIAL/PLATELET
Eosinophils Absolute: 0.2 10*3/uL (ref 0.0–0.5)
LYMPH%: 39.7 % (ref 14.0–49.0)
MONO#: 0.3 10*3/uL (ref 0.1–0.9)
NEUT#: 0.9 10*3/uL — ABNORMAL LOW (ref 1.5–6.5)
Platelets: 110 10*3/uL — ABNORMAL LOW (ref 140–400)
RBC: 2.3 10*6/uL — ABNORMAL LOW (ref 4.20–5.82)
WBC: 2.5 10*3/uL — ABNORMAL LOW (ref 4.0–10.3)

## 2012-02-28 NOTE — Progress Notes (Signed)
OFFICE PROGRESS NOTE  Interval history:  Aaron Bruce returns as scheduled. He continues Revlimid 5 mg every other day. Energy level is "fair". He denies shortness of breath. No bleeding. No fever.   Objective: Blood pressure 110/59, pulse 82, temperature 99 F (37.2 C), temperature source Oral, resp. rate 18, height 5' 6.5" (1.689 m), weight 159 lb 8 oz (72.349 kg).  Oropharynx is without thrush or ulceration. Lungs are clear. Regular cardiac rhythm. Abdomen soft and nontender. No organomegaly. Extremities without edema.  Lab Results: Lab Results  Component Value Date   WBC 2.5* 02/28/2012   HGB 8.1* 02/28/2012   HCT 23.8* 02/28/2012   MCV 103.5* 02/28/2012   PLT 110* 02/28/2012    Chemistry:    Chemistry      Component Value Date/Time   NA 140 07/31/2011 0832   K 4.6 07/31/2011 0832   CL 105 07/31/2011 0832   CO2 22 07/31/2011 0832   BUN 26* 07/31/2011 0832   CREATININE 1.02 07/31/2011 0832      Component Value Date/Time   CALCIUM 9.6 07/31/2011 0832   ALKPHOS 86 05/02/2011 1519   ALKPHOS 86 05/02/2011 1519   AST 17 05/02/2011 1519   AST 17 05/02/2011 1519   ALT 17 05/02/2011 1519   ALT 17 05/02/2011 1519   BILITOT 0.3 05/02/2011 1519   BILITOT 0.3 05/02/2011 1519       Studies/Results: No results found.  Medications: I have reviewed the patient's current medications.  Assessment/Plan:  1. 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 He began Revlimid 11/30/2009. Revlimid was placed on hold 12/13/2009 when the absolute neutrophil count returned at 0.5. The Revlimid was resumed at a dose of 10 mg every other day on 12/21/2009. The anemia improved. Revlimid was decreased to 5 mg every other day 01/2010 due to neutropenia. 2. Probable cellulitis of the left lower posterior leg 12/13/2009, resolved following treatment with ciprofloxacin.  3. Left lower extremity edema and pain 12/13/2009 with a negative venous Doppler. 4. History of  hypertension. 5. Diabetes. 6. History of a cerebrovascular accident. 7. Hyperlipidemia. 8. Peripheral vascular disease. 9. History of neutropenia secondary to Revlimid. 10. Mild thrombocytopenia, likely secondary to Revlimid, stable. 11. Status post kyphoplasty L3 and L4 on 07/31/2011 for compression fractures.  Disposition-Aaron Bruce has progressive anemia and leukopenia. He does not feel that he needs a blood transfusion at present. He will return for a followup CBC on 03/05/2012. Dr. Truett Perna gave instructions for him to discontinue Revlimid. We will check CBCs on a weekly basis. He will return for a followup visit on 03/19/2012. If there is no improvement in the blood counts with discontinuation of Revlimid he may need a bone marrow biopsy. He will contact the office prior to his next visit with signs/symptoms suggestive of progressive anemia.  Plan reviewed with Dr. Truett Perna.    Aaron Bruce ANP/GNP-BC

## 2012-02-28 NOTE — Telephone Encounter (Signed)
gve the pt his sept 2013 appt calendar °

## 2012-03-05 ENCOUNTER — Other Ambulatory Visit (HOSPITAL_BASED_OUTPATIENT_CLINIC_OR_DEPARTMENT_OTHER): Payer: Medicare Other | Admitting: Lab

## 2012-03-05 ENCOUNTER — Other Ambulatory Visit: Payer: Self-pay | Admitting: Nurse Practitioner

## 2012-03-05 ENCOUNTER — Ambulatory Visit (HOSPITAL_BASED_OUTPATIENT_CLINIC_OR_DEPARTMENT_OTHER): Payer: Medicare Other

## 2012-03-05 ENCOUNTER — Encounter (HOSPITAL_COMMUNITY)
Admission: RE | Admit: 2012-03-05 | Discharge: 2012-03-05 | Disposition: A | Discharge status: Home / Self Care | Payer: Medicare Other | Source: Ambulatory Visit | Attending: Oncology | Admitting: Oncology

## 2012-03-05 VITALS — BP 115/50 | HR 54 | Temp 97.3°F | Resp 20

## 2012-03-05 DIAGNOSIS — D649 Anemia, unspecified: Secondary | ICD-10-CM | POA: Insufficient documentation

## 2012-03-05 DIAGNOSIS — D469 Myelodysplastic syndrome, unspecified: Secondary | ICD-10-CM | POA: Insufficient documentation

## 2012-03-05 DIAGNOSIS — D46C Myelodysplastic syndrome with isolated del(5q) chromosomal abnormality: Secondary | ICD-10-CM

## 2012-03-05 LAB — CBC WITH DIFFERENTIAL/PLATELET
Basophils Absolute: 0.2 10*3/uL — ABNORMAL HIGH (ref 0.0–0.1)
EOS%: 2.9 % (ref 0.0–7.0)
Eosinophils Absolute: 0.1 10*3/uL (ref 0.0–0.5)
HCT: 23.3 % — ABNORMAL LOW (ref 38.4–49.9)
HGB: 8 g/dL — ABNORMAL LOW (ref 13.0–17.1)
MCH: 36.7 pg — ABNORMAL HIGH (ref 27.2–33.4)
MONO#: 0.5 10*3/uL (ref 0.1–0.9)
NEUT%: 44.6 % (ref 39.0–75.0)
lymph#: 1.5 10*3/uL (ref 0.9–3.3)

## 2012-03-05 MED ORDER — SODIUM CHLORIDE 0.9 % IV SOLN
250.0000 mL | Freq: Once | INTRAVENOUS | Status: AC
  Administered 2012-03-05: 20 mL/h via INTRAVENOUS

## 2012-03-05 NOTE — Patient Instructions (Addendum)
Blood Products Information This is information about transfusions of blood products. All blood that is to be transfused is tested for blood type, compatibility with the recipient, and for infections. Except in emergencies, giving a transfusion requires a written consent. Blood transfusions are often given as packed red blood cells. This means the other parts of the blood have been taken out. Blood may be needed to treat severe anemia or bleeding. Other blood products include plasma, platelets, immune globulin, and cryoprecipitate. Blood for transfusion is mostly donated by volunteers. The blood donors are carefully screened for risk factors that could cause disease. Donors are all tested for infections that could be transmitted by blood. The blood product supply today is the safest it has ever been. Some risks do remain.  A minor reaction with fever, chills, or rash happens in about 1% of blood product transfusions.   Life-threatening reactions occur in less than 1 in a million transfusions.   Infection with germs (bacteria), viruses or parasites like malaria can still happen. The risk is very low.   Hepatitis B occurs in about 1 case in 150,000 transfusions.   Hepatitis C is seen once in 500,000.  HIV is transmitted less than once every million transfusions. Blood Transfusion  A blood transfusion replaces your blood or some of its parts. Blood is replaced when you have lost blood because of surgery, an accident, or for severe blood conditions like anemia. You can donate blood to be used on yourself if you have a planned surgery. If you lose blood during that surgery, your own blood can be given back to you. Any blood given to you is checked to make sure it matches your blood type. Your temperature, blood pressure, and heart rate (vital signs) will be checked often.  GET HELP RIGHT AWAY IF:  You feel sick to your stomach (nauseous) or throw up (vomit).  You have watery poop (diarrhea).  You have  shortness of breath or trouble breathing.  You have blood in your pee (urine) or have dark colored pee.  You have chest pain or tightness.  Your eyes or skin turn yellow (jaundice).  You have a temperature by mouth above 102 F (38.9 C), not controlled by medicine.  You start to shake and have chills.  You develop a a red rash (hives) or feel itchy.  You develop lightheadedness or feel confused.  You develop back, joint, or muscle pain.  You do not feel hungry (lost appetite).  You feel tired, restless, or nervous.  You develop belly (abdominal) cramps.  Document Released: 09/13/2008 Document Revised: 06/06/2011 Document Reviewed: 09/13/2008  [facility] Patient Information 2012 [place], [place]. When you receive a transfusion of packed red blood cells, your blood is tested for blood group and Rh type. Your blood is also screened for antibodies that could cause a serious reaction. A cross-match test is done to make sure the blood is safe to give.  Talk with your caregiver if you have any concerns about receiving a transfusion of blood products. Make sure your questions are answered. Transfusions are not given if your caregiver feels the risk is greater than the need. Document Released: 06/17/2005 Document Revised: 06/06/2011 Document Reviewed: 12/05/2006 [facility] Patient Information 2012 [place], [place].

## 2012-03-07 ENCOUNTER — Ambulatory Visit (HOSPITAL_BASED_OUTPATIENT_CLINIC_OR_DEPARTMENT_OTHER): Payer: Medicare Other

## 2012-03-07 VITALS — BP 129/60 | HR 80 | Temp 98.5°F | Resp 16

## 2012-03-07 DIAGNOSIS — D649 Anemia, unspecified: Secondary | ICD-10-CM

## 2012-03-07 MED ORDER — SODIUM CHLORIDE 0.9 % IV SOLN: 250.0000 mL | Freq: Once | INTRAVENOUS | Status: DC

## 2012-03-08 LAB — TYPE AND SCREEN: Antibody Screen: NEGATIVE

## 2012-03-11 ENCOUNTER — Other Ambulatory Visit: Payer: Self-pay | Admitting: *Deleted

## 2012-03-11 NOTE — Telephone Encounter (Signed)
Rec'd fax from [facility] for refill on Revlimid. Spoke with Dr Truett Perna, patient is on hold until next follow up visit on 03/19/12 due to low blood counts. Called and informed Victorino Dike with [facility].

## 2012-03-19 ENCOUNTER — Other Ambulatory Visit (HOSPITAL_BASED_OUTPATIENT_CLINIC_OR_DEPARTMENT_OTHER): Payer: Medicare Other | Admitting: Lab

## 2012-03-19 ENCOUNTER — Telehealth: Payer: Self-pay | Admitting: Oncology

## 2012-03-19 ENCOUNTER — Ambulatory Visit (HOSPITAL_BASED_OUTPATIENT_CLINIC_OR_DEPARTMENT_OTHER): Payer: Medicare Other | Admitting: Nurse Practitioner

## 2012-03-19 VITALS — BP 122/63 | HR 78 | Temp 98.1°F | Resp 18 | Ht 66.5 in | Wt 158.1 lb

## 2012-03-19 DIAGNOSIS — I10 Essential (primary) hypertension: Secondary | ICD-10-CM

## 2012-03-19 DIAGNOSIS — D696 Thrombocytopenia, unspecified: Secondary | ICD-10-CM

## 2012-03-19 DIAGNOSIS — E119 Type 2 diabetes mellitus without complications: Secondary | ICD-10-CM

## 2012-03-19 DIAGNOSIS — D46C Myelodysplastic syndrome with isolated del(5q) chromosomal abnormality: Secondary | ICD-10-CM

## 2012-03-19 LAB — CBC WITH DIFFERENTIAL/PLATELET
Eosinophils Absolute: 0.2 10*3/uL (ref 0.0–0.5)
HGB: 10.2 g/dL — ABNORMAL LOW (ref 13.0–17.1)
LYMPH%: 32.9 % (ref 14.0–49.0)
MONO#: 0.5 10*3/uL (ref 0.1–0.9)
NEUT#: 2 10*3/uL (ref 1.5–6.5)
Platelets: 93 10*3/uL — ABNORMAL LOW (ref 140–400)
RBC: 3 10*6/uL — ABNORMAL LOW (ref 4.20–5.82)
WBC: 4.1 10*3/uL (ref 4.0–10.3)

## 2012-03-19 NOTE — Telephone Encounter (Signed)
Gave pt appt for October 2013 labs and Md visit on 04/30/12 with labs

## 2012-03-19 NOTE — Progress Notes (Signed)
OFFICE PROGRESS NOTE  Interval history:  Aaron Bruce returns as scheduled. He was transfused 2 units of red blood cells on 03/07/2012. He noted no improvement in his energy level following the blood transfusion. He denies shortness of breath. No bleeding. No fevers.   Objective: Blood pressure 122/63, pulse 78, temperature 98.1 F (36.7 C), temperature source Oral, resp. rate 18, height 5' 6.5" (1.689 m), weight 158 lb 1.6 oz (71.714 kg).  Oropharynx is without thrush or ulceration. Lungs are clear. No wheezes or rales. Regular cardiac rhythm. Abdomen soft and nontender. No organomegaly. Trace edema at the lower legs bilaterally.  Lab Results: Lab Results  Component Value Date   WBC 4.1 03/19/2012   HGB 10.2* 03/19/2012   HCT 30.3* 03/19/2012   MCV 101.0* 03/19/2012   PLT 93* 03/19/2012    Chemistry:    Chemistry      Component Value Date/Time   NA 140 07/31/2011 0832   K 4.6 07/31/2011 0832   CL 105 07/31/2011 0832   CO2 22 07/31/2011 0832   BUN 26* 07/31/2011 0832   CREATININE 1.02 07/31/2011 0832      Component Value Date/Time   CALCIUM 9.6 07/31/2011 0832   ALKPHOS 86 05/02/2011 1519   ALKPHOS 86 05/02/2011 1519   AST 17 05/02/2011 1519   AST 17 05/02/2011 1519   ALT 17 05/02/2011 1519   ALT 17 05/02/2011 1519   BILITOT 0.3 05/02/2011 1519   BILITOT 0.3 05/02/2011 1519       Studies/Results: No results found.  Medications: I have reviewed the patient's current medications.  Assessment/Plan:  1. 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 He began Revlimid 11/30/2009. Revlimid was placed on hold 12/13/2009 when the absolute neutrophil count returned at 0.5. The Revlimid was resumed at a dose of 10 mg every other day on 12/21/2009. The anemia improved. Revlimid was decreased to 5 mg every other day 01/2010 due to neutropenia. Revlimid was discontinued following an office visit 02/28/2012 due to progressive anemia and leukopenia. 2. Probable cellulitis of  the left lower posterior leg 12/13/2009, resolved following treatment with ciprofloxacin.  3. Left lower extremity edema and pain 12/13/2009 with a negative venous Doppler. 4. History of hypertension. 5. Diabetes. 6. History of a cerebrovascular accident. 7. Hyperlipidemia. 8. Peripheral vascular disease. 9. History of neutropenia secondary to Revlimid. 10. Mild thrombocytopenia. Question if secondary to Revlimid. 11. Status post kyphoplasty L3 and L4 on 07/31/2011 for compression fractures.  Disposition-Aaron Bruce's white count is better since discontinuing the Revlimid. The hemoglobin improved following the recent blood transfusion. He will continue to hold Revlimid. We will check labs on a 2 week schedule. He will return for a followup visit in 6 weeks. We will discuss proceeding with a bone marrow biopsy versus a trial of erythropoietin pending lab results over the next 6 weeks. He will contact the office prior to his next visit with any problems.  Plan reviewed with Dr. Truett Perna.  Lonna Cobb ANP/GNP-BC

## 2012-04-02 ENCOUNTER — Other Ambulatory Visit (HOSPITAL_BASED_OUTPATIENT_CLINIC_OR_DEPARTMENT_OTHER): Payer: Medicare Other | Admitting: Lab

## 2012-04-02 ENCOUNTER — Telehealth: Payer: Self-pay | Admitting: *Deleted

## 2012-04-02 DIAGNOSIS — D46C Myelodysplastic syndrome with isolated del(5q) chromosomal abnormality: Secondary | ICD-10-CM

## 2012-04-02 LAB — CBC WITH DIFFERENTIAL/PLATELET
EOS%: 1.6 % (ref 0.0–7.0)
MCH: 36.2 pg — ABNORMAL HIGH (ref 27.2–33.4)
MCV: 102.3 fL — ABNORMAL HIGH (ref 79.3–98.0)
MONO%: 12.2 % (ref 0.0–14.0)
RBC: 2.52 10*6/uL — ABNORMAL LOW (ref 4.20–5.82)
RDW: 18.4 % — ABNORMAL HIGH (ref 11.0–14.6)

## 2012-04-02 LAB — HOLD TUBE, BLOOD BANK

## 2012-04-03 NOTE — Telephone Encounter (Signed)
Spoke with patient in lobby regarding his counts. Does not require transfusion this week, but will recheck next week to see if blood needed. He will continue to hold his Revlimid. Inquired if he is willing to proceed with bone marrow biopsy test as suggested by Dr. Truett Perna and he agrees to proceed and gives permission to schedule this.

## 2012-04-05 ENCOUNTER — Other Ambulatory Visit: Payer: Self-pay | Admitting: Oncology

## 2012-04-05 DIAGNOSIS — D46C Myelodysplastic syndrome with isolated del(5q) chromosomal abnormality: Secondary | ICD-10-CM

## 2012-04-06 ENCOUNTER — Telehealth: Payer: Self-pay | Admitting: Oncology

## 2012-04-06 ENCOUNTER — Other Ambulatory Visit: Payer: Self-pay | Admitting: *Deleted

## 2012-04-06 DIAGNOSIS — D46C Myelodysplastic syndrome with isolated del(5q) chromosomal abnormality: Secondary | ICD-10-CM

## 2012-04-06 NOTE — Telephone Encounter (Signed)
S.w. Tiffany with central scheduling to schedule CT biopsy...she stated that the office schedules and calls the pt.

## 2012-04-09 ENCOUNTER — Telehealth: Payer: Self-pay | Admitting: *Deleted

## 2012-04-09 ENCOUNTER — Other Ambulatory Visit: Payer: Self-pay | Admitting: *Deleted

## 2012-04-09 ENCOUNTER — Other Ambulatory Visit (HOSPITAL_BASED_OUTPATIENT_CLINIC_OR_DEPARTMENT_OTHER): Payer: Medicare Other | Admitting: Lab

## 2012-04-09 ENCOUNTER — Encounter (HOSPITAL_COMMUNITY)
Admission: RE | Admit: 2012-04-09 | Discharge: 2012-04-09 | Disposition: A | Discharge status: Home / Self Care | Payer: Medicare Other | Source: Ambulatory Visit | Attending: Oncology | Admitting: Oncology

## 2012-04-09 DIAGNOSIS — D649 Anemia, unspecified: Secondary | ICD-10-CM

## 2012-04-09 DIAGNOSIS — D46C Myelodysplastic syndrome with isolated del(5q) chromosomal abnormality: Secondary | ICD-10-CM

## 2012-04-09 LAB — CBC WITH DIFFERENTIAL/PLATELET
BASO%: 1.2 % (ref 0.0–2.0)
EOS%: 2.2 % (ref 0.0–7.0)
LYMPH%: 29.1 % (ref 14.0–49.0)
MCH: 35.9 pg — ABNORMAL HIGH (ref 27.2–33.4)
MCHC: 34.8 g/dL (ref 32.0–36.0)
MONO#: 0.4 10*3/uL (ref 0.1–0.9)
Platelets: 137 10*3/uL — ABNORMAL LOW (ref 140–400)
RBC: 2.35 10*6/uL — ABNORMAL LOW (ref 4.20–5.82)
WBC: 3.8 10*3/uL — ABNORMAL LOW (ref 4.0–10.3)

## 2012-04-09 NOTE — Telephone Encounter (Signed)
Hgb down to 8.4 and patient reports feeling very weak and dizzy headed. He is asking for transfusion. Will transfuse tomorrow at 1145. Instructed him to keep his blue arm band on. He is aware of appointment next week for bone marrow biopsy and plans to come for procedure.

## 2012-04-10 ENCOUNTER — Ambulatory Visit (HOSPITAL_BASED_OUTPATIENT_CLINIC_OR_DEPARTMENT_OTHER): Payer: Medicare Other

## 2012-04-10 VITALS — BP 137/54 | HR 81 | Temp 98.8°F | Resp 18

## 2012-04-10 DIAGNOSIS — D649 Anemia, unspecified: Secondary | ICD-10-CM

## 2012-04-10 MED ORDER — SODIUM CHLORIDE 0.9 % IV SOLN
250.0000 mL | Freq: Once | INTRAVENOUS | Status: AC
  Administered 2012-04-10: 250 mL via INTRAVENOUS

## 2012-04-10 NOTE — Patient Instructions (Signed)
Blood Transfusion Information WHAT IS A BLOOD TRANSFUSION? A transfusion is the replacement of blood or some of its parts. Blood is made up of multiple cells which provide different functions.  Red blood cells carry oxygen and are used for blood loss replacement.  White blood cells fight against infection.  Platelets control bleeding.  Plasma helps clot blood.  Other blood products are available for specialized needs, such as hemophilia or other clotting disorders. BEFORE THE TRANSFUSION  Who gives blood for transfusions?   You may be able to donate blood to be used at a later date on yourself (autologous donation).  Relatives can be asked to donate blood. This is generally not any safer than if you have received blood from a stranger. The same precautions are taken to ensure safety when a relative's blood is donated.  Healthy volunteers who are fully evaluated to make sure their blood is safe. This is blood bank blood. Transfusion therapy is the safest it has ever been in the practice of medicine. Before blood is taken from a donor, a complete history is taken to make sure that person has no history of diseases nor engages in risky social behavior (examples are intravenous drug use or sexual activity with multiple partners). The donor's travel history is screened to minimize risk of transmitting infections, such as malaria. The donated blood is tested for signs of infectious diseases, such as HIV and hepatitis. The blood is then tested to be sure it is compatible with you in order to minimize the chance of a transfusion reaction. If you or a relative donates blood, this is often done in anticipation of surgery and is not appropriate for emergency situations. It takes many days to process the donated blood. RISKS AND COMPLICATIONS Although transfusion therapy is very safe and saves many lives, the main dangers of transfusion include:   Getting an infectious disease.  Developing a  transfusion reaction. This is an allergic reaction to something in the blood you were given. Every precaution is taken to prevent this. The decision to have a blood transfusion has been considered carefully by your caregiver before blood is given. Blood is not given unless the benefits outweigh the risks. AFTER THE TRANSFUSION  Right after receiving a blood transfusion, you will usually feel much better and more energetic. This is especially true if your red blood cells have gotten low (anemic). The transfusion raises the level of the red blood cells which carry oxygen, and this usually causes an energy increase.  The nurse administering the transfusion will monitor you carefully for complications. HOME CARE INSTRUCTIONS  No special instructions are needed after a transfusion. You may find your energy is better. Speak with your caregiver about any limitations on activity for underlying diseases you may have. SEEK MEDICAL CARE IF:   Your condition is not improving after your transfusion.  You develop redness or irritation at the intravenous (IV) site. SEEK IMMEDIATE MEDICAL CARE IF:  Any of the following symptoms occur over the next 12 hours:  Shaking chills.  You have a temperature by mouth above 102 F (38.9 C), not controlled by medicine.  Chest, back, or muscle pain.  People around you feel you are not acting correctly or are confused.  Shortness of breath or difficulty breathing.  Dizziness and fainting.  You get a rash or develop hives.  You have a decrease in urine output.  Your urine turns a dark color or changes to pink, red, or brown. Any of the following   symptoms occur over the next 10 days:  You have a temperature by mouth above 102 F (38.9 C), not controlled by medicine.  Shortness of breath.  Weakness after normal activity.  The white part of the eye turns yellow (jaundice).  You have a decrease in the amount of urine or are urinating less often.  Your  urine turns a dark color or changes to pink, red, or brown. Document Released: 06/14/2000 Document Revised: 09/09/2011 Document Reviewed: 02/01/2008 [employer] Patient Information 2013 [employer].  

## 2012-04-11 LAB — TYPE AND SCREEN: Unit division: 0

## 2012-04-14 ENCOUNTER — Other Ambulatory Visit: Payer: Self-pay | Admitting: Radiology

## 2012-04-15 ENCOUNTER — Other Ambulatory Visit: Payer: Self-pay | Admitting: Radiology

## 2012-04-15 ENCOUNTER — Telehealth: Payer: Self-pay | Admitting: *Deleted

## 2012-04-15 NOTE — Telephone Encounter (Signed)
Patient had called twice yesterday asking about procedure tomorrow and if he can eat breakfast or not? Called his home and spoke with daughter and instructed her to have him NPO except to take his cardiac meds with sip of water. Hold other am meds, especially his glucophage. Check in at 0715 in radiology department. Biopsy should be finished by 10 am and will need to remain in short stay for 3 hours to recover. CBC will be done at [facility], so will cancel 3 pm lab at [facility].

## 2012-04-16 ENCOUNTER — Ambulatory Visit (HOSPITAL_COMMUNITY)
Admission: RE | Admit: 2012-04-16 | Discharge: 2012-04-16 | Disposition: A | Discharge status: Home / Self Care | Payer: Medicare Other | Source: Ambulatory Visit | Attending: Oncology | Admitting: Oncology

## 2012-04-16 ENCOUNTER — Other Ambulatory Visit: Payer: Medicare Other | Admitting: Lab

## 2012-04-16 ENCOUNTER — Encounter (HOSPITAL_COMMUNITY)
Admission: RE | Admit: 2012-04-16 | Discharge: 2012-04-16 | Disposition: A | Discharge status: Home / Self Care | Payer: Medicare Other | Source: Ambulatory Visit | Attending: Oncology | Admitting: Oncology

## 2012-04-16 ENCOUNTER — Encounter (HOSPITAL_COMMUNITY): Payer: Self-pay

## 2012-04-16 DIAGNOSIS — Z79899 Other long term (current) drug therapy: Secondary | ICD-10-CM | POA: Insufficient documentation

## 2012-04-16 DIAGNOSIS — Z8673 Personal history of transient ischemic attack (TIA), and cerebral infarction without residual deficits: Secondary | ICD-10-CM | POA: Insufficient documentation

## 2012-04-16 DIAGNOSIS — E119 Type 2 diabetes mellitus without complications: Secondary | ICD-10-CM | POA: Insufficient documentation

## 2012-04-16 DIAGNOSIS — D46C Myelodysplastic syndrome with isolated del(5q) chromosomal abnormality: Secondary | ICD-10-CM | POA: Insufficient documentation

## 2012-04-16 DIAGNOSIS — I10 Essential (primary) hypertension: Secondary | ICD-10-CM | POA: Insufficient documentation

## 2012-04-16 DIAGNOSIS — D649 Anemia, unspecified: Secondary | ICD-10-CM | POA: Insufficient documentation

## 2012-04-16 DIAGNOSIS — D696 Thrombocytopenia, unspecified: Secondary | ICD-10-CM | POA: Insufficient documentation

## 2012-04-16 DIAGNOSIS — E785 Hyperlipidemia, unspecified: Secondary | ICD-10-CM | POA: Insufficient documentation

## 2012-04-16 LAB — CBC
MCHC: 35.1 g/dL (ref 30.0–36.0)
Platelets: 127 10*3/uL — ABNORMAL LOW (ref 150–400)
RDW: 16.1 % — ABNORMAL HIGH (ref 11.5–15.5)

## 2012-04-16 LAB — PROTIME-INR
INR: 1.03 (ref 0.00–1.49)
Prothrombin Time: 13.4 seconds (ref 11.6–15.2)

## 2012-04-16 LAB — APTT: aPTT: 49 seconds — ABNORMAL HIGH (ref 24–37)

## 2012-04-16 MED ORDER — DEXTROSE-NACL 5-0.45 % IV SOLN: INTRAVENOUS | Status: DC

## 2012-04-16 MED ORDER — FENTANYL CITRATE 0.05 MG/ML IJ SOLN
INTRAMUSCULAR | Status: AC
  Filled 2012-04-16: qty 6

## 2012-04-16 MED ORDER — MIDAZOLAM HCL 2 MG/2ML IJ SOLN
INTRAMUSCULAR | Status: AC | PRN
  Administered 2012-04-16: 1 mg via INTRAVENOUS

## 2012-04-16 MED ORDER — SODIUM CHLORIDE 0.9 % IV SOLN: INTRAVENOUS | Status: DC

## 2012-04-16 MED ORDER — FENTANYL CITRATE 0.05 MG/ML IJ SOLN
INTRAMUSCULAR | Status: AC | PRN
  Administered 2012-04-16: 50 ug via INTRAVENOUS

## 2012-04-16 MED ORDER — HYDROCODONE-ACETAMINOPHEN 5-325 MG PO TABS
1.0000 | ORAL_TABLET | ORAL | Status: DC | PRN
  Filled 2012-04-16: qty 2

## 2012-04-16 MED ORDER — MIDAZOLAM HCL 2 MG/2ML IJ SOLN
INTRAMUSCULAR | Status: AC
  Filled 2012-04-16: qty 6

## 2012-04-16 NOTE — Progress Notes (Signed)
D5 1/2 at 50cc/hr hung at 0810. Currently computer (Epic) unable to scan the IV fluid but it has been hung. Will document on MAR when working properly. Patient ready for biopsy and resting comfortably in bed.

## 2012-04-16 NOTE — Progress Notes (Signed)
Patient's CBG 79. Informed Caryn Bee PA who is here to see patient. D5 1/4 ordered for patient. Will continue to monitor.

## 2012-04-16 NOTE — Procedures (Signed)
CT guided aspirates and cores biopsies of right iliac bone marrow.  4 cores performed.  No immediate complication.

## 2012-04-16 NOTE — H&P (Signed)
Aaron Bruce is an 78 y.o. male.   Chief Complaint: "I'm here for a bone marrow biopsy" HPI: Patient with history of myelodysplasia, anemia and thrombocytopenia presents today for CT guided bone marrow biopsy.  Past Medical History  Diagnosis Date  . Anemia   . Hypertension   . Diabetes mellitus   . Stroke   . Hyperlipemia   . Peripheral vascular disease   . Arthritis     Osteoarthritis  . Irritable bowel syndrome (IBS)   . Myelodysplasia with 5q deletion   . Thrombocytopenia     stable--due to Revlimid    Past Surgical History  Procedure Date  . Tonsillectomy 1948  . Kidney stone surgery 1950's    No family history on file. Social History:  does not have a smoking history on file. He does not have any smokeless tobacco history on file. His alcohol and drug histories not on file.  Allergies: No Known Allergies  Current outpatient prescriptions:amLODipine (NORVASC) 10 MG tablet, Take 10 mg by mouth daily. , Disp: , Rfl: ;  Ascorbic Acid 1000 MG TBCR, Take 1 tablet by mouth daily. , Disp: , Rfl: ;  B Complex Vitamins (VITAMIN B COMPLEX PO), Take 1 tablet by mouth daily. , Disp: , Rfl: ;  BISACODYL PO, Take 1 capsule by mouth at bedtime. , Disp: , Rfl: ;  Calcium Carbonate-Vitamin D (CALCIUM 600 + D PO), Take 1 tablet by mouth daily. , Disp: , Rfl:  Chromium 200 MCG CAPS, Take 1 capsule by mouth daily. , Disp: , Rfl: ;  Ergocalciferol 2000 UNITS TABS, Take 1 tablet by mouth daily. , Disp: , Rfl: ;  Flaxseed, Linseed, (FLAXSEED OIL PO), Take 1 capsule by mouth 2 (two) times daily. , Disp: , Rfl: ;  Garlic 1000 MG CAPS, Take 1 capsule by mouth daily.  , Disp: , Rfl: ;  glyBURIDE (DIABETA) 5 MG tablet, Take 10 mg by mouth daily. , Disp: , Rfl:  HM CINNAMON PO, Take 1,000 mg by mouth 2 (two) times daily. , Disp: , Rfl: ;  Lecithin 1200 MG CAPS, Take 1 capsule by mouth daily.  , Disp: , Rfl: ;  Magnesium 250 MG TABS, Take 1 tablet by mouth daily.  , Disp: , Rfl: ;  metFORMIN (GLUCOPHAGE)  1000 MG tablet, Take 1,000 mg by mouth daily. , Disp: , Rfl: ;  Multiple Vitamins-Minerals (CENTRUM SILVER PO), Take 1 tablet by mouth daily.  , Disp: , Rfl:  niacin 500 MG tablet, Take 500 mg by mouth daily with breakfast.  , Disp: , Rfl: ;  NUTRITIONAL SUPPLEMENTS PO, Take by mouth daily. Wheatgrass, Mineral Powder, Prostate Formula, ArthroZyme Joint & Muscle, Ultimate BP, Natto-K, Ultimate Bone Support, Tri-Boron,Sustanol (testosterone supplement), Vitamin/herb for energy , Disp: , Rfl: ;  Omega-3 Fatty Acids (FISH OIL CONCENTRATE) 1000 MG CAPS, Take 1 capsule by mouth daily. , Disp: , Rfl:  Selenium 200 MCG CAPS, Take 1 capsule by mouth daily.  , Disp: , Rfl: ;  telmisartan (MICARDIS) 40 MG tablet, Take 40 mg by mouth daily.  , Disp: , Rfl: ;  vitamin B-12 (CYANOCOBALAMIN) 1000 MCG tablet, Take 1,000 mcg by mouth daily.  , Disp: , Rfl:  No current facility-administered medications for this encounter. Facility-Administered Medications Ordered in Other Encounters: 0.9 %  sodium chloride infusion, , Intravenous, Continuous, D Kevin Allred, PA;  dextrose 5 %-0.45 % sodium chloride infusion, , Intravenous, Continuous, D Jeananne Rama, PA ]  Results for orders placed during  the hospital encounter of 04/16/12 (from the past 48 hour(s))  APTT     Status: Abnormal   Collection Time   04/16/12  7:45 AM      Component Value Range Comment   aPTT 49 (*) 24 - 37 seconds   CBC     Status: Abnormal   Collection Time   04/16/12  7:45 AM      Component Value Range Comment   WBC 4.1  4.0 - 10.5 K/uL    RBC 2.95 (*) 4.22 - 5.81 MIL/uL    Hemoglobin 10.1 (*) 13.0 - 17.0 g/dL    HCT 16.1 (*) 09.6 - 52.0 %    MCV 97.6  78.0 - 100.0 fL    MCH 34.2 (*) 26.0 - 34.0 pg    MCHC 35.1  30.0 - 36.0 g/dL    RDW 04.5 (*) 40.9 - 15.5 %    Platelets 127 (*) 150 - 400 K/uL   PROTIME-INR     Status: Normal   Collection Time   04/16/12  7:45 AM      Component Value Range Comment   Prothrombin Time 13.4  11.6 - 15.2  seconds    INR 1.03  0.00 - 1.49    No results found.  Review of Systems  Constitutional: Negative for fever and chills.  Respiratory: Negative for cough and shortness of breath.   Cardiovascular: Negative for chest pain.  Gastrointestinal: Negative for nausea, vomiting and abdominal pain.  Neurological: Negative for headaches.   Vitals: BP 134/64   HR   75  R 16  TEMP  98.4   O2 SATS  99%RA Physical Exam  Constitutional: He is oriented to person, place, and time. He appears well-developed and well-nourished.  Cardiovascular: Normal rate and regular rhythm.   Respiratory: Effort normal and breath sounds normal.  GI: Soft. Bowel sounds are normal. There is no tenderness.  Musculoskeletal: Normal range of motion. He exhibits no edema.  Neurological: He is alert and oriented to person, place, and time.     Assessment/Plan Patient with history of myelodysplasia, anemia and thrombocytopenia. Plan is for CT guided bone marrow biopsy today. Details/risks of procedure d/w pt/daughter with their understanding and consent.  ALLRED,D KEVIN 04/16/2012, 8:10 AM

## 2012-04-16 NOTE — Progress Notes (Signed)
Spoke to pharmacy and Caryn Bee PA and order changed to D5 1/2 NS at 50cc/hr.

## 2012-04-30 ENCOUNTER — Ambulatory Visit (HOSPITAL_BASED_OUTPATIENT_CLINIC_OR_DEPARTMENT_OTHER): Payer: Medicare Other | Admitting: Oncology

## 2012-04-30 ENCOUNTER — Other Ambulatory Visit (HOSPITAL_BASED_OUTPATIENT_CLINIC_OR_DEPARTMENT_OTHER): Payer: Medicare Other | Admitting: Lab

## 2012-04-30 ENCOUNTER — Telehealth: Payer: Self-pay | Admitting: Oncology

## 2012-04-30 VITALS — BP 119/62 | HR 82 | Temp 98.0°F | Resp 18 | Ht 66.5 in | Wt 154.9 lb

## 2012-04-30 DIAGNOSIS — D46C Myelodysplastic syndrome with isolated del(5q) chromosomal abnormality: Secondary | ICD-10-CM

## 2012-04-30 DIAGNOSIS — D539 Nutritional anemia, unspecified: Secondary | ICD-10-CM

## 2012-04-30 LAB — CBC WITH DIFFERENTIAL/PLATELET
BASO%: 1.3 % (ref 0.0–2.0)
EOS%: 3.1 % (ref 0.0–7.0)
Eosinophils Absolute: 0.1 10*3/uL (ref 0.0–0.5)
LYMPH%: 24.5 % (ref 14.0–49.0)
MCH: 35 pg — ABNORMAL HIGH (ref 27.2–33.4)
MCHC: 35 g/dL (ref 32.0–36.0)
MCV: 100 fL — ABNORMAL HIGH (ref 79.3–98.0)
MONO%: 12.8 % (ref 0.0–14.0)
Platelets: 139 10*3/uL — ABNORMAL LOW (ref 140–400)
RBC: 2.77 10*6/uL — ABNORMAL LOW (ref 4.20–5.82)

## 2012-04-30 LAB — HOLD TUBE, BLOOD BANK

## 2012-04-30 NOTE — Progress Notes (Signed)
   [facility]    OFFICE PROGRESS NOTE   INTERVAL HISTORY:   He returns as scheduled. He reports malaise. Mild discomfort at the right hip area. He was transfused with packed red blood cells on 04/10/2012. A bone marrow biopsy on 04/16/2012 confirmed changes of myelodysplasia. No increase in blasts were noted. The cytogenetics returned with a 5Q minus and 9:11 translocation.  Objective:  Vital signs in last 24 hours:  Blood pressure 119/62, pulse 82, temperature 98 F (36.7 C), temperature source Oral, resp. rate 18, height 5' 6.5" (1.689 m), weight 154 lb 14.4 oz (70.262 kg).   Resp: Lungs clear bilaterally Cardio: Regular rate and rhythm GI: No hepatosplenomegaly Vascular: No leg edema   Lab Results:  Lab Results  Component Value Date   WBC 4.5 04/30/2012   HGB 9.7* 04/30/2012   HCT 27.7* 04/30/2012   MCV 100.0* 04/30/2012   PLT 139* 04/30/2012   ANC 2.6    Medications: I have reviewed the patient's current medications.  Assessment/Plan: 1. 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 He began Revlimid 11/30/2009. Revlimid was placed on hold 12/13/2009 when the absolute neutrophil count returned at 0.5. The Revlimid was resumed at a dose of 10 mg every other day on 12/21/2009. The anemia improved. Revlimid was decreased to 5 mg every other day 01/2010 due to neutropenia. Revlimid was discontinued following an office visit 02/28/2012 due to progressive anemia and leukopenia. Bone marrow biopsy on 04/16/2012 confirmed changes of myelodysplasia with no increase in blasts (5q-and 9:11 translocation) 2. Probable cellulitis of the left lower posterior leg 12/13/2009, resolved following treatment with ciprofloxacin.  3. Left lower extremity edema and pain 12/13/2009 with a negative venous Doppler. 4. History of hypertension. 5. Diabetes. 6. History of a cerebrovascular accident. 7. Hyperlipidemia. 8. Peripheral vascular  disease. 9. History of neutropenia secondary to Revlimid. 10. Mild thrombocytopenia. Potentially related to Revlimid, improved 11. Status post kyphoplasty L3 and L4 on 07/31/2011 for compression fractures.  Disposition:  He appears stable from a hematologic standpoint. The bone marrow biopsy confirmed changes of myelodysplasia with no increase in blasts. The leukopenia/thrombocytopenia have improved since discontinuing Revlimid and the hemoglobin is stable today. It is not clear whether the progressive anemia over the past few months is related to resistance to Revlimid or toxicity from the Revlimid. We decided to continue an observation approach for now. If the anemia progresses we will consider reinstituting Revlimid at an increased dose versus a trial of erythropoietin.  [name] will return for a CBC in 2 weeks. He is scheduled for a 1 month office visit.  He takes multiple over-the-counter vitamins and herbal supplements. I explained the lack of evidence supporting the use of these agents to help with the myelodysplasia. I recommended he discuss his medication list with Dr. Juleen China. I suspect he could eliminate the majority of his nonprescription medications.   Mancel Bale, M.D.

## 2012-04-30 NOTE — Telephone Encounter (Signed)
appts made and printed for pt aom °

## 2012-05-14 ENCOUNTER — Other Ambulatory Visit (HOSPITAL_BASED_OUTPATIENT_CLINIC_OR_DEPARTMENT_OTHER): Payer: Medicare Other

## 2012-05-14 DIAGNOSIS — D46C Myelodysplastic syndrome with isolated del(5q) chromosomal abnormality: Secondary | ICD-10-CM

## 2012-05-14 LAB — CBC WITH DIFFERENTIAL/PLATELET
BASO%: 1.3 % (ref 0.0–2.0)
EOS%: 3.5 % (ref 0.0–7.0)
HCT: 27 % — ABNORMAL LOW (ref 38.4–49.9)
LYMPH%: 23.9 % (ref 14.0–49.0)
MCH: 35.4 pg — ABNORMAL HIGH (ref 27.2–33.4)
MCHC: 35.1 g/dL (ref 32.0–36.0)
MCV: 100.9 fL — ABNORMAL HIGH (ref 79.3–98.0)
MONO#: 0.6 10*3/uL (ref 0.1–0.9)
MONO%: 11.7 % (ref 0.0–14.0)
NEUT%: 59.6 % (ref 39.0–75.0)
Platelets: 132 10*3/uL — ABNORMAL LOW (ref 140–400)
RBC: 2.68 10*6/uL — ABNORMAL LOW (ref 4.20–5.82)

## 2012-05-21 ENCOUNTER — Other Ambulatory Visit: Payer: Self-pay | Admitting: Endocrinology

## 2012-05-21 DIAGNOSIS — M545 Low back pain: Secondary | ICD-10-CM

## 2012-05-26 ENCOUNTER — Other Ambulatory Visit: Payer: Medicare Other | Admitting: Lab

## 2012-05-26 ENCOUNTER — Ambulatory Visit: Payer: Medicare Other | Admitting: Nurse Practitioner

## 2012-05-27 ENCOUNTER — Other Ambulatory Visit: Payer: Self-pay | Admitting: *Deleted

## 2012-05-27 ENCOUNTER — Ambulatory Visit (HOSPITAL_BASED_OUTPATIENT_CLINIC_OR_DEPARTMENT_OTHER): Payer: Medicare Other | Admitting: Lab

## 2012-05-27 ENCOUNTER — Telehealth: Payer: Self-pay | Admitting: *Deleted

## 2012-05-27 ENCOUNTER — Encounter: Payer: Self-pay | Admitting: *Deleted

## 2012-05-27 ENCOUNTER — Telehealth: Payer: Self-pay | Admitting: Oncology

## 2012-05-27 DIAGNOSIS — D46C Myelodysplastic syndrome with isolated del(5q) chromosomal abnormality: Secondary | ICD-10-CM

## 2012-05-27 LAB — CBC WITH DIFFERENTIAL/PLATELET
BASO%: 2.6 % — ABNORMAL HIGH (ref 0.0–2.0)
Basophils Absolute: 0.1 10*3/uL (ref 0.0–0.1)
EOS%: 7.7 % — ABNORMAL HIGH (ref 0.0–7.0)
HGB: 8.8 g/dL — ABNORMAL LOW (ref 13.0–17.1)
MCH: 32.6 pg (ref 27.2–33.4)
MCHC: 32.8 g/dL (ref 32.0–36.0)
MCV: 99.3 fL — ABNORMAL HIGH (ref 79.3–98.0)
MONO%: 25.5 % — ABNORMAL HIGH (ref 0.0–14.0)
RDW: 17.1 % — ABNORMAL HIGH (ref 11.0–14.6)

## 2012-05-27 NOTE — Telephone Encounter (Signed)
S/w pt re appts for 12/3 and 12/9

## 2012-05-27 NOTE — Telephone Encounter (Signed)
Pr staff phone call and POF I have scheduled appt. JMW

## 2012-05-27 NOTE — Telephone Encounter (Signed)
Per susan c /per dr gbs the pt does not need blood on 11/30.  Michelle aware     [employer]

## 2012-05-27 NOTE — Telephone Encounter (Signed)
Called pt & reported lab results from today.  Per Dr Truett Perna, pt not to get blood transfusion & low WBC/ANC probably due to his MDS.  Dr. Truett Perna suggested he restart his revlimid at same dose as before which was 5 mg every other day.  Pt reports that he does have an unopened bottle & instructed to start tonight.  He verbalizes understanding & is able to repeat instructions.  Informed that the schedulers would call him for an appt for lab 12/l3/13 & MD appt 06/08/12.

## 2012-05-29 ENCOUNTER — Ambulatory Visit
Admission: RE | Admit: 2012-05-29 | Discharge: 2012-05-29 | Disposition: A | Discharge status: Home / Self Care | Payer: Medicare Other | Source: Ambulatory Visit | Attending: Endocrinology | Admitting: Endocrinology

## 2012-05-29 DIAGNOSIS — M545 Low back pain: Secondary | ICD-10-CM

## 2012-05-29 MED ORDER — GADOBENATE DIMEGLUMINE 529 MG/ML IV SOLN
14.0000 mL | Freq: Once | INTRAVENOUS | Status: AC | PRN
  Administered 2012-05-29: 14 mL via INTRAVENOUS

## 2012-06-02 ENCOUNTER — Other Ambulatory Visit (HOSPITAL_BASED_OUTPATIENT_CLINIC_OR_DEPARTMENT_OTHER): Payer: Medicare Other | Admitting: Lab

## 2012-06-02 DIAGNOSIS — D46C Myelodysplastic syndrome with isolated del(5q) chromosomal abnormality: Secondary | ICD-10-CM

## 2012-06-02 LAB — HOLD TUBE, BLOOD BANK

## 2012-06-02 LAB — CBC WITH DIFFERENTIAL/PLATELET
BASO%: 1.1 % (ref 0.0–2.0)
EOS%: 4.6 % (ref 0.0–7.0)
Eosinophils Absolute: 0.2 10*3/uL (ref 0.0–0.5)
LYMPH%: 24.1 % (ref 14.0–49.0)
MCHC: 34.1 g/dL (ref 32.0–36.0)
MCV: 99.1 fL — ABNORMAL HIGH (ref 79.3–98.0)
MONO%: 15.7 % — ABNORMAL HIGH (ref 0.0–14.0)
NEUT#: 2.2 10*3/uL (ref 1.5–6.5)
Platelets: 174 10*3/uL (ref 140–400)
RBC: 2.5 10*6/uL — ABNORMAL LOW (ref 4.20–5.82)
RDW: 19.9 % — ABNORMAL HIGH (ref 11.0–14.6)
WBC: 4 10*3/uL (ref 4.0–10.3)

## 2012-06-03 ENCOUNTER — Telehealth: Payer: Self-pay | Admitting: *Deleted

## 2012-06-03 NOTE — Telephone Encounter (Signed)
Called and spoke with pt; per Dr. Truett Perna lab work is stable, cont. Revlimid.  Pt verbalized understanding and confirmed appt for 06/08/12.

## 2012-06-03 NOTE — Telephone Encounter (Signed)
Message copied by Gala Romney on Wed Jun 03, 2012  3:31 PM ------      Message from: Ladene Artist      Created: Tue Jun 02, 2012  8:21 PM       Please call patient, hb is stable, anc is better, cont. Revlimid, f/u 12/9 as scheduled

## 2012-06-05 ENCOUNTER — Other Ambulatory Visit (HOSPITAL_COMMUNITY): Payer: Self-pay | Admitting: Endocrinology

## 2012-06-05 ENCOUNTER — Telehealth (HOSPITAL_COMMUNITY): Payer: Self-pay | Admitting: Interventional Radiology

## 2012-06-05 DIAGNOSIS — IMO0002 Reserved for concepts with insufficient information to code with codable children: Secondary | ICD-10-CM

## 2012-06-08 ENCOUNTER — Other Ambulatory Visit: Payer: Medicare Other | Admitting: Lab

## 2012-06-08 ENCOUNTER — Telehealth: Payer: Self-pay | Admitting: Oncology

## 2012-06-08 ENCOUNTER — Other Ambulatory Visit (HOSPITAL_BASED_OUTPATIENT_CLINIC_OR_DEPARTMENT_OTHER): Payer: Medicare Other

## 2012-06-08 ENCOUNTER — Ambulatory Visit: Payer: Medicare Other | Admitting: Nurse Practitioner

## 2012-06-08 ENCOUNTER — Ambulatory Visit (HOSPITAL_BASED_OUTPATIENT_CLINIC_OR_DEPARTMENT_OTHER): Payer: Medicare Other | Admitting: Nurse Practitioner

## 2012-06-08 VITALS — BP 131/48 | HR 85 | Temp 98.6°F | Resp 18 | Ht 66.5 in | Wt 157.6 lb

## 2012-06-08 DIAGNOSIS — D46C Myelodysplastic syndrome with isolated del(5q) chromosomal abnormality: Secondary | ICD-10-CM

## 2012-06-08 DIAGNOSIS — D649 Anemia, unspecified: Secondary | ICD-10-CM

## 2012-06-08 LAB — COMPREHENSIVE METABOLIC PANEL (CC13)
ALT: 17 U/L (ref 0–55)
AST: 13 U/L (ref 5–34)
CO2: 26 mEq/L (ref 22–29)
Calcium: 9.2 mg/dL (ref 8.4–10.4)
Chloride: 108 mEq/L — ABNORMAL HIGH (ref 98–107)
Creatinine: 1.3 mg/dL (ref 0.7–1.3)
Potassium: 4.6 mEq/L (ref 3.5–5.1)
Sodium: 144 mEq/L (ref 136–145)
Total Protein: 6 g/dL — ABNORMAL LOW (ref 6.4–8.3)

## 2012-06-08 LAB — CBC WITH DIFFERENTIAL/PLATELET
BASO%: 1.1 % (ref 0.0–2.0)
EOS%: 4.7 % (ref 0.0–7.0)
HCT: 25.1 % — ABNORMAL LOW (ref 38.4–49.9)
MCH: 32.5 pg (ref 27.2–33.4)
MCHC: 31.9 g/dL — ABNORMAL LOW (ref 32.0–36.0)
MONO#: 0.8 10*3/uL (ref 0.1–0.9)
NEUT%: 53.8 % (ref 39.0–75.0)
RBC: 2.46 10*6/uL — ABNORMAL LOW (ref 4.20–5.82)
RDW: 18.7 % — ABNORMAL HIGH (ref 11.0–14.6)
WBC: 5.6 10*3/uL (ref 4.0–10.3)
lymph#: 1.4 10*3/uL (ref 0.9–3.3)

## 2012-06-08 NOTE — Telephone Encounter (Signed)
appts made and printed for pt aom °

## 2012-06-08 NOTE — Progress Notes (Signed)
OFFICE PROGRESS NOTE  Interval history:  Aaron. Aaron Bruce returns after missing a recent followup visit. He resumed Revlimid at a dose of 5 mg every other day on 05/27/2012. He denies nausea/vomiting. No mouth sores. No diarrhea. He tends to be constipated. No numbness or tingling in his hands or feet. He denies any fevers. No sweats. No bleeding. He denies shortness of breath. No cough. He reports mild stable leg swelling. No calf pain.   Objective: Blood pressure 131/48, pulse 85, temperature 98.6 F (37 C), temperature source Oral, resp. rate 18, height 5' 6.5" (1.689 m), weight 157 lb 9.6 oz (71.487 kg).  He is somewhat disheveled appearing. Oropharynx is without thrush or ulceration. Lungs are clear. Regular cardiac rhythm. Abdomen is soft and nontender. No organomegaly. Trace lower leg edema bilaterally. Calves are soft and nontender.  Lab Results: Lab Results  Component Value Date   WBC 5.6 06/08/2012   HGB 8.0* 06/08/2012   HCT 25.1* 06/08/2012   MCV 102.0* 06/08/2012   PLT 190 06/08/2012    Chemistry:    Chemistry      Component Value Date/Time   NA 140 07/31/2011 0832   K 4.6 07/31/2011 0832   CL 105 07/31/2011 0832   CO2 22 07/31/2011 0832   BUN 26* 07/31/2011 0832   CREATININE 1.02 07/31/2011 0832      Component Value Date/Time   CALCIUM 9.6 07/31/2011 0832   ALKPHOS 86 05/02/2011 1519   ALKPHOS 86 05/02/2011 1519   AST 17 05/02/2011 1519   AST 17 05/02/2011 1519   ALT 17 05/02/2011 1519   ALT 17 05/02/2011 1519   BILITOT 0.3 05/02/2011 1519   BILITOT 0.3 05/02/2011 1519       Studies/Results: Aaron Bruce W Wo Contrast  05/29/2012  *RADIOLOGY REPORT*  Clinical Data: Low back pain.  Compression fracture.  BUN and creatinine were obtained on site at [facility] at [address] Results:  BUN 16 mg/dL,  Creatinine 1.0 mg/dL.  MRI LUMBAR Bruce WITHOUT AND WITH CONTRAST  Technique:  Multiplanar and multiecho pulse sequences of the lumbar Bruce were obtained without and  with intravenous contrast.  Contrast: 14mL MULTIHANCE GADOBENATE DIMEGLUMINE 529 MG/ML IV SOLN  Comparison: Radiographs dated 05/19/2012 and lumbar MRI dated 07/18/2011  Findings: There is an acute or subacute benign compression fracture of the superior endplate of L5.  There are old healed compression fractures of the superior endplates of L3 and L4 treated with vertebroplasty.  There are no disc protrusions or significant disc bulges.  No bony neural impingement from the fractures.  There is severe bilateral facet arthritis at the L5-S1.  The hypertrophied facets and ligamenta flava narrow the lateral recesses but there is no focal neural impingement.  The appearance is unchanged.  Stable spondylolisthesis of L5 on S1 of approximately 3 mm.  IMPRESSION: Acute or subacute benign-appearing compression fracture of the superior plate of L5.  No neural impingement.  Severe bilateral facet arthritis at L5-S1, unchanged.   Original Report Authenticated By: Francene Boyers, M.D.     Medications: I have reviewed the patient's current medications.  Assessment/Plan:  1. 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 He began Revlimid 11/30/2009. Revlimid was placed on hold 12/13/2009 when the absolute neutrophil count returned at 0.5. The Revlimid was resumed at a dose of 10 mg every other day on 12/21/2009. The anemia improved. Revlimid was decreased to 5 mg every other day 01/2010 due to neutropenia. Revlimid was discontinued following  an office visit 02/28/2012 due to progressive anemia and leukopenia. Bone marrow biopsy on 04/16/2012 confirmed changes of myelodysplasia with no increase in blasts (5q-and 9:11 translocation). Revlimid resumed on 05/27/2012. 2. Probable cellulitis of the left lower posterior leg 12/13/2009, resolved following treatment with ciprofloxacin.  3. Left lower extremity edema and pain 12/13/2009 with a negative venous Doppler. 4. History of  hypertension. 5. Diabetes. 6. History of a cerebrovascular accident. 7. Hyperlipidemia. 8. Peripheral vascular disease. 9. History of neutropenia secondary to Revlimid. 10. Mild thrombocytopenia. Potentially related to Revlimid. Platelet count in normal range today. 11. Status post kyphoplasty L3 and L4 on 07/31/2011 for compression fractures. 12. Neutropenia on labs 05/27/2012. Question secondary to MDS. Revlimid resumed on 05/27/2012. Neutrophil count in normal range on 06/02/2012 and today.  Disposition-he will continue Revlimid 5 mg every other day. He will return for a CBC and type and hold in 2 weeks. He will return for a followup visit in 4 weeks. He will contact the office in the interim with any problems. We specifically discussed signs/symptoms suggestive of progressive anemia.  Plan reviewed with Dr. Truett Perna.  Lonna Cobb ANP/GNP-BC

## 2012-06-09 ENCOUNTER — Ambulatory Visit (HOSPITAL_COMMUNITY)
Admission: RE | Admit: 2012-06-09 | Discharge: 2012-06-09 | Disposition: A | Discharge status: Home / Self Care | Payer: Medicare Other | Source: Ambulatory Visit | Attending: Endocrinology | Admitting: Endocrinology

## 2012-06-09 DIAGNOSIS — IMO0002 Reserved for concepts with insufficient information to code with codable children: Secondary | ICD-10-CM

## 2012-06-22 ENCOUNTER — Other Ambulatory Visit (HOSPITAL_BASED_OUTPATIENT_CLINIC_OR_DEPARTMENT_OTHER): Payer: Medicare Other | Admitting: Lab

## 2012-06-22 ENCOUNTER — Encounter (HOSPITAL_COMMUNITY): Payer: Medicare Other

## 2012-06-22 ENCOUNTER — Other Ambulatory Visit: Payer: Self-pay | Admitting: *Deleted

## 2012-06-22 ENCOUNTER — Telehealth: Payer: Self-pay | Admitting: *Deleted

## 2012-06-22 ENCOUNTER — Encounter (HOSPITAL_COMMUNITY)
Admission: RE | Admit: 2012-06-22 | Discharge: 2012-06-22 | Disposition: A | Discharge status: Home / Self Care | Payer: Medicare Other | Source: Ambulatory Visit | Attending: Oncology | Admitting: Oncology

## 2012-06-22 DIAGNOSIS — D649 Anemia, unspecified: Secondary | ICD-10-CM

## 2012-06-22 DIAGNOSIS — D46C Myelodysplastic syndrome with isolated del(5q) chromosomal abnormality: Secondary | ICD-10-CM

## 2012-06-22 LAB — CBC WITH DIFFERENTIAL/PLATELET
BASO%: 1.8 % (ref 0.0–2.0)
EOS%: 4.1 % (ref 0.0–7.0)
HCT: 22.1 % — ABNORMAL LOW (ref 38.4–49.9)
MCH: 34.2 pg — ABNORMAL HIGH (ref 27.2–33.4)
MCHC: 34.2 g/dL (ref 32.0–36.0)
NEUT%: 52.3 % (ref 39.0–75.0)
RBC: 2.21 10*6/uL — ABNORMAL LOW (ref 4.20–5.82)
WBC: 4.6 10*3/uL (ref 4.0–10.3)
lymph#: 1.2 10*3/uL (ref 0.9–3.3)

## 2012-06-22 LAB — PREPARE RBC (CROSSMATCH)

## 2012-06-22 LAB — HOLD TUBE, BLOOD BANK

## 2012-06-22 NOTE — Progress Notes (Signed)
Spoke with patient in lobby regarding CBC results. He requests transfusion due to weakness and dyspnea with minimal exertion and wants to feel better for holiday. Will transfuse tomorrow at 0830. Instructed him to keep his arm band on. Confirmed he is taking his Revlimid 5 mg every other day.

## 2012-06-22 NOTE — Telephone Encounter (Signed)
error 

## 2012-06-23 ENCOUNTER — Ambulatory Visit (HOSPITAL_BASED_OUTPATIENT_CLINIC_OR_DEPARTMENT_OTHER): Payer: Medicare Other

## 2012-06-23 ENCOUNTER — Other Ambulatory Visit: Payer: Self-pay | Admitting: *Deleted

## 2012-06-23 VITALS — BP 129/60 | HR 81 | Temp 98.6°F | Resp 16

## 2012-06-23 DIAGNOSIS — D649 Anemia, unspecified: Secondary | ICD-10-CM

## 2012-06-23 MED ORDER — SODIUM CHLORIDE 0.9 % IV SOLN
250.0000 mL | Freq: Once | INTRAVENOUS | Status: AC
  Administered 2012-06-23: 250 mL via INTRAVENOUS

## 2012-06-23 MED ORDER — REVLIMID 5 MG PO CAPS: 5.0000 mg | ORAL_CAPSULE | Freq: Every day | ORAL | Status: DC

## 2012-06-23 NOTE — Telephone Encounter (Signed)
Spoke with patient in treatment area and told him to increase his Revlimid to 5 mg daily per direction of Dr. Truett Perna. Gave him phone # of [employer] to call and do his survey.

## 2012-06-23 NOTE — Patient Instructions (Addendum)
Blood Transfusion Information WHAT IS A BLOOD TRANSFUSION? A transfusion is the replacement of blood or some of its parts. Blood is made up of multiple cells which provide different functions.  Red blood cells carry oxygen and are used for blood loss replacement.  White blood cells fight against infection.  Platelets control bleeding.  Plasma helps clot blood.  Other blood products are available for specialized needs, such as hemophilia or other clotting disorders. BEFORE THE TRANSFUSION  Who gives blood for transfusions?   You may be able to donate blood to be used at a later date on yourself (autologous donation).  Relatives can be asked to donate blood. This is generally not any safer than if you have received blood from a stranger. The same precautions are taken to ensure safety when a relative's blood is donated.  Healthy volunteers who are fully evaluated to make sure their blood is safe. This is blood bank blood. Transfusion therapy is the safest it has ever been in the practice of medicine. Before blood is taken from a donor, a complete history is taken to make sure that person has no history of diseases nor engages in risky social behavior (examples are intravenous drug use or sexual activity with multiple partners). The donor's travel history is screened to minimize risk of transmitting infections, such as malaria. The donated blood is tested for signs of infectious diseases, such as HIV and hepatitis. The blood is then tested to be sure it is compatible with you in order to minimize the chance of a transfusion reaction. If you or a relative donates blood, this is often done in anticipation of surgery and is not appropriate for emergency situations. It takes many days to process the donated blood. RISKS AND COMPLICATIONS Although transfusion therapy is very safe and saves many lives, the main dangers of transfusion include:   Getting an infectious disease.  Developing a  transfusion reaction. This is an allergic reaction to something in the blood you were given. Every precaution is taken to prevent this. The decision to have a blood transfusion has been considered carefully by your caregiver before blood is given. Blood is not given unless the benefits outweigh the risks. AFTER THE TRANSFUSION  Right after receiving a blood transfusion, you will usually feel much better and more energetic. This is especially true if your red blood cells have gotten low (anemic). The transfusion raises the level of the red blood cells which carry oxygen, and this usually causes an energy increase.  The nurse administering the transfusion will monitor you carefully for complications. HOME CARE INSTRUCTIONS  No special instructions are needed after a transfusion. You may find your energy is better. Speak with your caregiver about any limitations on activity for underlying diseases you may have. SEEK MEDICAL CARE IF:   Your condition is not improving after your transfusion.  You develop redness or irritation at the intravenous (IV) site. SEEK IMMEDIATE MEDICAL CARE IF:  Any of the following symptoms occur over the next 12 hours:  Shaking chills.  You have a temperature by mouth above 102 F (38.9 C), not controlled by medicine.  Chest, back, or muscle pain.  People around you feel you are not acting correctly or are confused.  Shortness of breath or difficulty breathing.  Dizziness and fainting.  You get a rash or develop hives.  You have a decrease in urine output.  Your urine turns a dark color or changes to pink, red, or brown. Any of the following   symptoms occur over the next 10 days:  You have a temperature by mouth above 102 F (38.9 C), not controlled by medicine.  Shortness of breath.  Weakness after normal activity.  The white part of the eye turns yellow (jaundice).  You have a decrease in the amount of urine or are urinating less often.  Your  urine turns a dark color or changes to pink, red, or brown. Document Released: 06/14/2000 Document Revised: 09/09/2011 Document Reviewed: 02/01/2008 [employer] Patient Information 2013 [employer].  

## 2012-06-24 LAB — TYPE AND SCREEN
Antibody Screen: NEGATIVE
Unit division: 0

## 2012-06-26 NOTE — Telephone Encounter (Signed)
RECEIVED A FAX FROM [facility] CONCERNING A CONFIRMATION OF PRESCRIPTION SHIPMENT FOR REVLIMID ON 06/25/12.

## 2012-07-07 ENCOUNTER — Ambulatory Visit (HOSPITAL_BASED_OUTPATIENT_CLINIC_OR_DEPARTMENT_OTHER): Payer: Medicare Other | Admitting: Oncology

## 2012-07-07 ENCOUNTER — Telehealth: Payer: Self-pay | Admitting: Oncology

## 2012-07-07 ENCOUNTER — Other Ambulatory Visit (HOSPITAL_BASED_OUTPATIENT_CLINIC_OR_DEPARTMENT_OTHER): Payer: Medicare Other | Admitting: Lab

## 2012-07-07 VITALS — BP 139/54 | HR 81 | Temp 98.4°F | Resp 18 | Ht 66.5 in | Wt 153.6 lb

## 2012-07-07 DIAGNOSIS — D46C Myelodysplastic syndrome with isolated del(5q) chromosomal abnormality: Secondary | ICD-10-CM

## 2012-07-07 DIAGNOSIS — D702 Other drug-induced agranulocytosis: Secondary | ICD-10-CM

## 2012-07-07 DIAGNOSIS — R609 Edema, unspecified: Secondary | ICD-10-CM

## 2012-07-07 DIAGNOSIS — D696 Thrombocytopenia, unspecified: Secondary | ICD-10-CM

## 2012-07-07 LAB — CBC WITH DIFFERENTIAL/PLATELET
BASO%: 6 % — ABNORMAL HIGH (ref 0.0–2.0)
Basophils Absolute: 0.3 10*3/uL — ABNORMAL HIGH (ref 0.0–0.1)
HCT: 31.1 % — ABNORMAL LOW (ref 38.4–49.9)
HGB: 10.2 g/dL — ABNORMAL LOW (ref 13.0–17.1)
LYMPH%: 30.6 % (ref 14.0–49.0)
MCHC: 32.8 g/dL (ref 32.0–36.0)
MONO#: 0.6 10*3/uL (ref 0.1–0.9)
NEUT%: 43.4 % (ref 39.0–75.0)
Platelets: 175 10*3/uL (ref 140–400)
WBC: 5.4 10*3/uL (ref 4.0–10.3)
lymph#: 1.6 10*3/uL (ref 0.9–3.3)

## 2012-07-07 NOTE — Progress Notes (Signed)
   [facility]    OFFICE PROGRESS NOTE   INTERVAL HISTORY:   Aaron Bruce returns as scheduled. He is now taking Revlimid at a dose of 5 mg daily. No nausea, diarrhea, fever, or bleeding. No complaint. He was transfused with packed red blood cells on 06/23/2012.  Objective:  Vital signs in last 24 hours:  Blood pressure 139/54, pulse 81, temperature 98.4 F (36.9 C), temperature source Oral, resp. rate 18, height 5' 6.5" (1.689 m), weight 153 lb 9.6 oz (69.673 kg).    HEENT: Mild whitecoat over the tongue, no buccal thrush, no ulcers, a few petechiae at the left buccal mucosa Resp: Lungs clear bilaterally Cardio: Regular rate and rhythm GI: No hepatosplenomegaly Vascular: Trace low pretibial edema bilaterally   Lab Results:  Lab Results  Component Value Date   WBC 5.4 07/07/2012   HGB 10.2* 07/07/2012   HCT 31.1* 07/07/2012   MCV 100.6* 07/07/2012   PLT 175 07/07/2012   ANC 2.3    Medications: I have reviewed the patient's current medications.  Assessment/Plan: 1. 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 He began Revlimid 11/30/2009. Revlimid was placed on hold 12/13/2009 when the absolute neutrophil count returned at 0.5. The Revlimid was resumed at a dose of 10 mg every other day on 12/21/2009. The anemia improved. Revlimid was decreased to 5 mg every other day 01/2010 due to neutropenia. Revlimid was discontinued following an office visit 02/28/2012 due to progressive anemia and leukopenia. Bone marrow biopsy on 04/16/2012 confirmed changes of myelodysplasia with no increase in blasts (5q-and 9:11 translocation). Revlimid resumed on 05/27/2012. Increased to 5 mg daily on 06/23/2012. 2. Probable cellulitis of the left lower posterior leg 12/13/2009, resolved following treatment with ciprofloxacin.  3. Left lower extremity edema and pain 12/13/2009 with a negative venous Doppler. 4. History of hypertension. 5. Diabetes. 6. History of a  cerebrovascular accident. 7. Hyperlipidemia. 8. Peripheral vascular disease. 9. History of neutropenia secondary to Revlimid. 10. Mild thrombocytopenia. Potentially related to Revlimid. Platelet count in normal range today. 11. Status post kyphoplasty L3 and L4 on 07/31/2011 for compression fractures.       12. Neutropenia on labs 05/27/2012. Question secondary to MDS. Revlimid resumed on 05/27/2012. Neutrophil count is now in the normal range.         13. Mildly elevated creatinine 06/08/2012-A bmet will be checked when he returns in 3 weeks.    Disposition:   Aaron Bruce appears stable. The pancytopenia has improved with reintroduction of Revlimid. The plan is to continue Revlimid at a dose of 5 mg daily. He will return for a CBC and chemistry panel in 3 weeks. He is scheduled for an office visit in 6 weeks.  I encouraged Aaron Bruce to consider discontinuing the multiple vitamin and herbal supplements he is taking.   Thornton Papas, MD  07/07/2012  5:24 PM

## 2012-07-07 NOTE — Telephone Encounter (Signed)
appts made and printed for pt aom °

## 2012-07-16 ENCOUNTER — Other Ambulatory Visit: Payer: Self-pay | Admitting: *Deleted

## 2012-07-16 MED ORDER — REVLIMID 5 MG PO CAPS
5.0000 mg | ORAL_CAPSULE | Freq: Every day | ORAL | Status: DC
Start: 2012-07-16 — End: 2012-08-13

## 2012-07-16 NOTE — Telephone Encounter (Signed)
Called and spoke with patient, informed him of need to take monthly survey. Patient verbalized understanding.

## 2012-07-16 NOTE — Telephone Encounter (Signed)
RECEIVED A FAX FROM [facility] CONCERNING A CONFIRMATION OF FACSIMILE RECEIPT FOR PT. REFERRAL. 

## 2012-07-20 ENCOUNTER — Other Ambulatory Visit (HOSPITAL_COMMUNITY): Payer: Self-pay | Admitting: Interventional Radiology

## 2012-07-20 ENCOUNTER — Telehealth (HOSPITAL_COMMUNITY): Payer: Self-pay | Admitting: Interventional Radiology

## 2012-07-20 DIAGNOSIS — IMO0002 Reserved for concepts with insufficient information to code with codable children: Secondary | ICD-10-CM

## 2012-07-22 NOTE — Telephone Encounter (Signed)
Rec'd confirmation that Revlimid was shipped on 07/21/12 for next day delivery.

## 2012-07-28 ENCOUNTER — Encounter (HOSPITAL_COMMUNITY)
Admission: RE | Admit: 2012-07-28 | Discharge: 2012-07-28 | Disposition: A | Discharge status: Home / Self Care | Payer: Medicare Other | Source: Ambulatory Visit | Attending: Oncology | Admitting: Oncology

## 2012-07-28 ENCOUNTER — Other Ambulatory Visit: Payer: Self-pay | Admitting: Radiology

## 2012-07-28 ENCOUNTER — Other Ambulatory Visit: Payer: Self-pay | Admitting: *Deleted

## 2012-07-28 ENCOUNTER — Other Ambulatory Visit (HOSPITAL_BASED_OUTPATIENT_CLINIC_OR_DEPARTMENT_OTHER): Payer: Medicare Other | Admitting: Lab

## 2012-07-28 ENCOUNTER — Telehealth: Payer: Self-pay | Admitting: *Deleted

## 2012-07-28 DIAGNOSIS — D649 Anemia, unspecified: Secondary | ICD-10-CM

## 2012-07-28 DIAGNOSIS — D46C Myelodysplastic syndrome with isolated del(5q) chromosomal abnormality: Secondary | ICD-10-CM

## 2012-07-28 LAB — CBC WITH DIFFERENTIAL/PLATELET
Basophils Absolute: 0.1 10*3/uL (ref 0.0–0.1)
EOS%: 5.4 % (ref 0.0–7.0)
HCT: 19.7 % — ABNORMAL LOW (ref 38.4–49.9)
HGB: 6.6 g/dL — CL (ref 13.0–17.1)
MCH: 32.8 pg (ref 27.2–33.4)
MCV: 98 fL (ref 79.3–98.0)
MONO%: 13 % (ref 0.0–14.0)
NEUT%: 48.8 % (ref 39.0–75.0)

## 2012-07-28 LAB — BASIC METABOLIC PANEL (CC13)
BUN: 18.5 mg/dL (ref 7.0–26.0)
Chloride: 105 mEq/L (ref 98–107)
Potassium: 4 mEq/L (ref 3.5–5.1)
Sodium: 142 mEq/L (ref 136–145)

## 2012-07-28 LAB — PREPARE RBC (CROSSMATCH)

## 2012-07-28 NOTE — Telephone Encounter (Signed)
Made patient aware of Hgb of 6.6 and Dr. Truett Perna wants him to receive transfusion tomorrow at 0800. Patient says he can't come due to the weather (snow). His family member does not feel comfortable driving in snow. Made him aware that it would be in his best interest to come in tomorrow, but if not able he needs to rest and not exert himself. If he develops severe shortness of breath or chest pain he needs to call 911 to go to hospital. Reminded him to keep his blood bank bracelet on for transfusion on 07/30/12 at 12 noon.

## 2012-07-30 ENCOUNTER — Ambulatory Visit (HOSPITAL_BASED_OUTPATIENT_CLINIC_OR_DEPARTMENT_OTHER): Payer: Medicare Other

## 2012-07-30 ENCOUNTER — Other Ambulatory Visit: Payer: Self-pay | Admitting: Radiology

## 2012-07-30 VITALS — BP 130/51 | HR 85 | Temp 98.1°F | Resp 18

## 2012-07-30 DIAGNOSIS — D649 Anemia, unspecified: Secondary | ICD-10-CM

## 2012-07-30 MED ORDER — SODIUM CHLORIDE 0.9 % IV SOLN: 250.0000 mL | Freq: Once | INTRAVENOUS | Status: DC

## 2012-07-30 NOTE — Patient Instructions (Signed)
Blood Transfusion Information WHAT IS A BLOOD TRANSFUSION? A transfusion is the replacement of blood or some of its parts. Blood is made up of multiple cells which provide different functions.  Red blood cells carry oxygen and are used for blood loss replacement.  White blood cells fight against infection.  Platelets control bleeding.  Plasma helps clot blood.  Other blood products are available for specialized needs, such as hemophilia or other clotting disorders. BEFORE THE TRANSFUSION  Who gives blood for transfusions?   You may be able to donate blood to be used at a later date on yourself (autologous donation).  Relatives can be asked to donate blood. This is generally not any safer than if you have received blood from a stranger. The same precautions are taken to ensure safety when a relative's blood is donated.  Healthy volunteers who are fully evaluated to make sure their blood is safe. This is blood bank blood. Transfusion therapy is the safest it has ever been in the practice of medicine. Before blood is taken from a donor, a complete history is taken to make sure that person has no history of diseases nor engages in risky social behavior (examples are intravenous drug use or sexual activity with multiple partners). The donor's travel history is screened to minimize risk of transmitting infections, such as malaria. The donated blood is tested for signs of infectious diseases, such as HIV and hepatitis. The blood is then tested to be sure it is compatible with you in order to minimize the chance of a transfusion reaction. If you or a relative donates blood, this is often done in anticipation of surgery and is not appropriate for emergency situations. It takes many days to process the donated blood. RISKS AND COMPLICATIONS Although transfusion therapy is very safe and saves many lives, the main dangers of transfusion include:   Getting an infectious disease.  Developing a  transfusion reaction. This is an allergic reaction to something in the blood you were given. Every precaution is taken to prevent this. The decision to have a blood transfusion has been considered carefully by your caregiver before blood is given. Blood is not given unless the benefits outweigh the risks. AFTER THE TRANSFUSION  Right after receiving a blood transfusion, you will usually feel much better and more energetic. This is especially true if your red blood cells have gotten low (anemic). The transfusion raises the level of the red blood cells which carry oxygen, and this usually causes an energy increase.  The nurse administering the transfusion will monitor you carefully for complications. HOME CARE INSTRUCTIONS  No special instructions are needed after a transfusion. You may find your energy is better. Speak with your caregiver about any limitations on activity for underlying diseases you may have. SEEK MEDICAL CARE IF:   Your condition is not improving after your transfusion.  You develop redness or irritation at the intravenous (IV) site. SEEK IMMEDIATE MEDICAL CARE IF:  Any of the following symptoms occur over the next 12 hours:  Shaking chills.  You have a temperature by mouth above 102 F (38.9 C), not controlled by medicine.  Chest, back, or muscle pain.  People around you feel you are not acting correctly or are confused.  Shortness of breath or difficulty breathing.  Dizziness and fainting.  You get a rash or develop hives.  You have a decrease in urine output.  Your urine turns a dark color or changes to pink, red, or brown. Any of the following   symptoms occur over the next 10 days:  You have a temperature by mouth above 102 F (38.9 C), not controlled by medicine.  Shortness of breath.  Weakness after normal activity.  The white part of the eye turns yellow (jaundice).  You have a decrease in the amount of urine or are urinating less often.  Your  urine turns a dark color or changes to pink, red, or brown. Document Released: 06/14/2000 Document Revised: 09/09/2011 Document Reviewed: 02/01/2008 [employer] Patient Information 2013 [employer].  

## 2012-07-31 ENCOUNTER — Encounter (HOSPITAL_COMMUNITY): Payer: Self-pay

## 2012-07-31 ENCOUNTER — Ambulatory Visit (HOSPITAL_COMMUNITY)
Admission: RE | Admit: 2012-07-31 | Discharge: 2012-07-31 | Disposition: A | Discharge status: Home / Self Care | Payer: Medicare Other | Source: Ambulatory Visit | Attending: Interventional Radiology | Admitting: Interventional Radiology

## 2012-07-31 ENCOUNTER — Other Ambulatory Visit (HOSPITAL_COMMUNITY): Payer: Self-pay | Admitting: Interventional Radiology

## 2012-07-31 DIAGNOSIS — I10 Essential (primary) hypertension: Secondary | ICD-10-CM | POA: Insufficient documentation

## 2012-07-31 DIAGNOSIS — IMO0002 Reserved for concepts with insufficient information to code with codable children: Secondary | ICD-10-CM

## 2012-07-31 DIAGNOSIS — D696 Thrombocytopenia, unspecified: Secondary | ICD-10-CM | POA: Insufficient documentation

## 2012-07-31 DIAGNOSIS — E785 Hyperlipidemia, unspecified: Secondary | ICD-10-CM | POA: Insufficient documentation

## 2012-07-31 DIAGNOSIS — Q068 Other specified congenital malformations of spinal cord: Secondary | ICD-10-CM | POA: Insufficient documentation

## 2012-07-31 DIAGNOSIS — K589 Irritable bowel syndrome without diarrhea: Secondary | ICD-10-CM | POA: Insufficient documentation

## 2012-07-31 DIAGNOSIS — E119 Type 2 diabetes mellitus without complications: Secondary | ICD-10-CM | POA: Insufficient documentation

## 2012-07-31 DIAGNOSIS — Z8673 Personal history of transient ischemic attack (TIA), and cerebral infarction without residual deficits: Secondary | ICD-10-CM | POA: Insufficient documentation

## 2012-07-31 DIAGNOSIS — M8448XA Pathological fracture, other site, initial encounter for fracture: Secondary | ICD-10-CM | POA: Insufficient documentation

## 2012-07-31 DIAGNOSIS — I739 Peripheral vascular disease, unspecified: Secondary | ICD-10-CM | POA: Insufficient documentation

## 2012-07-31 LAB — BASIC METABOLIC PANEL
Calcium: 9 mg/dL (ref 8.4–10.5)
GFR calc Af Amer: 73 mL/min — ABNORMAL LOW (ref >90)
GFR calc non Af Amer: 63 mL/min — ABNORMAL LOW (ref >90)
Glucose, Bld: 99 mg/dL (ref 70–99)
Potassium: 4.2 mEq/L (ref 3.5–5.1)
Sodium: 142 mEq/L (ref 135–145)

## 2012-07-31 LAB — TYPE AND SCREEN
Antibody Screen: NEGATIVE
Unit division: 0

## 2012-07-31 LAB — PROTIME-INR
INR: 1.03 (ref 0.00–1.49)
Prothrombin Time: 13.4 seconds (ref 11.6–15.2)

## 2012-07-31 LAB — CBC
Hemoglobin: 7.6 g/dL — ABNORMAL LOW (ref 13.0–17.0)
Platelets: 132 10*3/uL — ABNORMAL LOW (ref 150–400)
RBC: 2.42 MIL/uL — ABNORMAL LOW (ref 4.22–5.81)
WBC: 4.5 10*3/uL (ref 4.0–10.5)

## 2012-07-31 LAB — GLUCOSE, CAPILLARY: Glucose-Capillary: 84 mg/dL (ref 70–99)

## 2012-07-31 LAB — APTT: aPTT: 24 seconds (ref 24–37)

## 2012-07-31 MED ORDER — FENTANYL CITRATE 0.05 MG/ML IJ SOLN
INTRAMUSCULAR | Status: AC | PRN
  Administered 2012-07-31: 25 ug via INTRAVENOUS

## 2012-07-31 MED ORDER — CEFAZOLIN SODIUM 1-5 GM-% IV SOLN
1.0000 g | Freq: Once | INTRAVENOUS | Status: AC
  Administered 2012-07-31: 1 g via INTRAVENOUS
  Filled 2012-07-31: qty 50

## 2012-07-31 MED ORDER — IOHEXOL 300 MG/ML  SOLN
50.0000 mL | Freq: Once | INTRAMUSCULAR | Status: AC | PRN
  Administered 2012-07-31: 5 mL via INTRAVENOUS

## 2012-07-31 MED ORDER — MIDAZOLAM HCL 2 MG/2ML IJ SOLN
INTRAMUSCULAR | Status: AC | PRN
  Administered 2012-07-31: 1 mg via INTRAVENOUS

## 2012-07-31 MED ORDER — FENTANYL CITRATE 0.05 MG/ML IJ SOLN
INTRAMUSCULAR | Status: AC
  Filled 2012-07-31: qty 4

## 2012-07-31 MED ORDER — SODIUM CHLORIDE 0.9 % IV SOLN
Freq: Once | INTRAVENOUS | Status: AC
  Administered 2012-07-31: 11:00:00 via INTRAVENOUS

## 2012-07-31 MED ORDER — TOBRAMYCIN SULFATE 1.2 G IJ SOLR
INTRAMUSCULAR | Status: AC
  Filled 2012-07-31: qty 1.2

## 2012-07-31 MED ORDER — HYDROMORPHONE HCL PF 1 MG/ML IJ SOLN
INTRAMUSCULAR | Status: DC
Start: 2012-07-31 — End: 2012-07-31
  Filled 2012-07-31: qty 2

## 2012-07-31 MED ORDER — MIDAZOLAM HCL 2 MG/2ML IJ SOLN
INTRAMUSCULAR | Status: AC
  Filled 2012-07-31: qty 4

## 2012-07-31 MED ORDER — SODIUM CHLORIDE 0.9 % IV SOLN
INTRAVENOUS | Status: AC
  Administered 2012-07-31: 13:00:00 via INTRAVENOUS

## 2012-07-31 NOTE — Procedures (Signed)
S/P L5 VP 

## 2012-07-31 NOTE — H&P (Signed)
Aaron Bruce is an 79 y.o. male.   Chief Complaint: low back pain since helping wife up from floor after she fell approx 6-8 weeks ago 05/29/12 MRI reveals L 5 fracture Pain continues; worsened with standing Scheduled for L5 Kyphoplasty Prev L3 and L4 KP 07/31/11  HPI: Anemia- pt had 2 u transfusion yesterday with Dr Truett Perna; HTN; DM; CVA; HLD; PVD; IBS; Myelodysplasia; thrombocytopenia  Past Medical History  Diagnosis Date  . Anemia   . Hypertension   . Diabetes mellitus   . Stroke   . Hyperlipemia   . Peripheral vascular disease   . Arthritis     Osteoarthritis  . Irritable bowel syndrome (IBS)   . Myelodysplasia with 5q deletion   . Thrombocytopenia     stable--due to Revlimid    Past Surgical History  Procedure Date  . Tonsillectomy 1948  . Kidney stone surgery 1950's    No family history on file. Social History:  does not have a smoking history on file. He does not have any smokeless tobacco history on file. His alcohol and drug histories not on file.  Allergies: No Known Allergies   (Not in a hospital admission)  No results found for this or any previous visit (from the past 48 hour(s)). No results found.  Review of Systems  Constitutional: Positive for weight loss. Negative for fever.  HENT: Positive for hearing loss.   Respiratory: Negative for cough.   Cardiovascular: Negative for chest pain.  Gastrointestinal: Negative for nausea and vomiting.  Musculoskeletal: Positive for back pain.  Neurological: Positive for weakness. Negative for headaches.    There were no vitals taken for this visit. Physical Exam  Constitutional: He is oriented to person, place, and time.       Frail   Cardiovascular: Normal rate, regular rhythm and normal heart sounds.   No murmur heard. Respiratory: Effort normal and breath sounds normal. No respiratory distress. He has no wheezes.  GI: Soft. Bowel sounds are normal. There is no tenderness.  Musculoskeletal: Normal  range of motion. He exhibits tenderness.       Low back pain  Neurological: He is alert and oriented to person, place, and time.  Skin: Skin is warm.  Psychiatric: He has a normal mood and affect. His behavior is normal. Judgment and thought content normal.     Assessment/Plan Pt helped wife from floor approx 6 weeks ago New L5 Fx on MRI Pt now scheduled for L5 kyphoplasty  Pt and dtr aware of procedure benefits and risks and agreeable to proceed Consent signed and in chart   Aaron Bruce A 07/31/2012, 10:27 AM

## 2012-08-07 ENCOUNTER — Other Ambulatory Visit (HOSPITAL_COMMUNITY): Payer: Self-pay | Admitting: Interventional Radiology

## 2012-08-07 DIAGNOSIS — IMO0002 Reserved for concepts with insufficient information to code with codable children: Secondary | ICD-10-CM

## 2012-08-12 ENCOUNTER — Ambulatory Visit (HOSPITAL_COMMUNITY): Payer: Medicare Other

## 2012-08-13 ENCOUNTER — Other Ambulatory Visit: Payer: Self-pay | Admitting: *Deleted

## 2012-08-13 MED ORDER — REVLIMID 5 MG PO CAPS: 5.0000 mg | ORAL_CAPSULE | Freq: Every day | ORAL | Status: DC

## 2012-08-13 NOTE — Telephone Encounter (Signed)
THIS REFILL REQUEST FOR REVLIMID WAS GIVEN TO DR.SHERRILL'S NURSE, SUSAN COWARD,RN. 

## 2012-08-14 ENCOUNTER — Ambulatory Visit (HOSPITAL_COMMUNITY): Admission: RE | Admit: 2012-08-14 | Payer: Medicare Other | Source: Ambulatory Visit

## 2012-08-17 ENCOUNTER — Encounter (HOSPITAL_COMMUNITY): Payer: Self-pay | Admitting: Emergency Medicine

## 2012-08-17 ENCOUNTER — Inpatient Hospital Stay (HOSPITAL_COMMUNITY)
Admission: EM | Admit: 2012-08-17 | Discharge: 2012-08-25 | DRG: 811 | Disposition: A | Discharge status: Skilled Nursing Facility | Payer: Medicare Other | Attending: Internal Medicine | Admitting: Internal Medicine

## 2012-08-17 DIAGNOSIS — T50995A Adverse effect of other drugs, medicaments and biological substances, initial encounter: Secondary | ICD-10-CM | POA: Diagnosis present

## 2012-08-17 DIAGNOSIS — D649 Anemia, unspecified: Secondary | ICD-10-CM

## 2012-08-17 DIAGNOSIS — I2489 Other forms of acute ischemic heart disease: Secondary | ICD-10-CM | POA: Diagnosis present

## 2012-08-17 DIAGNOSIS — R0902 Hypoxemia: Secondary | ICD-10-CM | POA: Diagnosis not present

## 2012-08-17 DIAGNOSIS — D630 Anemia in neoplastic disease: Principal | ICD-10-CM | POA: Diagnosis present

## 2012-08-17 DIAGNOSIS — I495 Sick sinus syndrome: Secondary | ICD-10-CM | POA: Diagnosis present

## 2012-08-17 DIAGNOSIS — Z4789 Encounter for other orthopedic aftercare: Secondary | ICD-10-CM

## 2012-08-17 DIAGNOSIS — R0600 Dyspnea, unspecified: Secondary | ICD-10-CM

## 2012-08-17 DIAGNOSIS — I10 Essential (primary) hypertension: Secondary | ICD-10-CM | POA: Diagnosis present

## 2012-08-17 DIAGNOSIS — R410 Disorientation, unspecified: Secondary | ICD-10-CM

## 2012-08-17 DIAGNOSIS — S32059A Unspecified fracture of fifth lumbar vertebra, initial encounter for closed fracture: Secondary | ICD-10-CM

## 2012-08-17 DIAGNOSIS — I5031 Acute diastolic (congestive) heart failure: Secondary | ICD-10-CM | POA: Diagnosis present

## 2012-08-17 DIAGNOSIS — E1169 Type 2 diabetes mellitus with other specified complication: Secondary | ICD-10-CM | POA: Diagnosis present

## 2012-08-17 DIAGNOSIS — E785 Hyperlipidemia, unspecified: Secondary | ICD-10-CM | POA: Diagnosis present

## 2012-08-17 DIAGNOSIS — G9340 Encephalopathy, unspecified: Secondary | ICD-10-CM | POA: Diagnosis present

## 2012-08-17 DIAGNOSIS — E119 Type 2 diabetes mellitus without complications: Secondary | ICD-10-CM | POA: Diagnosis present

## 2012-08-17 DIAGNOSIS — I509 Heart failure, unspecified: Secondary | ICD-10-CM | POA: Diagnosis present

## 2012-08-17 DIAGNOSIS — N390 Urinary tract infection, site not specified: Secondary | ICD-10-CM | POA: Diagnosis present

## 2012-08-17 DIAGNOSIS — D6959 Other secondary thrombocytopenia: Secondary | ICD-10-CM | POA: Diagnosis present

## 2012-08-17 DIAGNOSIS — D696 Thrombocytopenia, unspecified: Secondary | ICD-10-CM | POA: Diagnosis present

## 2012-08-17 DIAGNOSIS — Z8673 Personal history of transient ischemic attack (TIA), and cerebral infarction without residual deficits: Secondary | ICD-10-CM

## 2012-08-17 DIAGNOSIS — D46C Myelodysplastic syndrome with isolated del(5q) chromosomal abnormality: Secondary | ICD-10-CM | POA: Diagnosis present

## 2012-08-17 DIAGNOSIS — Z79899 Other long term (current) drug therapy: Secondary | ICD-10-CM

## 2012-08-17 DIAGNOSIS — I214 Non-ST elevation (NSTEMI) myocardial infarction: Secondary | ICD-10-CM

## 2012-08-17 DIAGNOSIS — S32009A Unspecified fracture of unspecified lumbar vertebra, initial encounter for closed fracture: Secondary | ICD-10-CM

## 2012-08-17 DIAGNOSIS — I739 Peripheral vascular disease, unspecified: Secondary | ICD-10-CM | POA: Diagnosis present

## 2012-08-17 DIAGNOSIS — I248 Other forms of acute ischemic heart disease: Secondary | ICD-10-CM | POA: Diagnosis present

## 2012-08-17 LAB — CBC WITH DIFFERENTIAL/PLATELET
Eosinophils Relative: 8 % — ABNORMAL HIGH (ref 0–5)
MCV: 93.2 fL (ref 78.0–100.0)
Monocytes Relative: 16 % — ABNORMAL HIGH (ref 3–12)
Neutrophils Relative %: 47 % (ref 43–77)
Platelets: 128 10*3/uL — ABNORMAL LOW (ref 150–400)
RBC: 1.62 MIL/uL — ABNORMAL LOW (ref 4.22–5.81)
WBC Morphology: INCREASED
WBC: 4.9 10*3/uL (ref 4.0–10.5)

## 2012-08-17 LAB — URINALYSIS, ROUTINE W REFLEX MICROSCOPIC
Hgb urine dipstick: NEGATIVE
Nitrite: NEGATIVE
Specific Gravity, Urine: 1.015 (ref 1.005–1.030)
Urobilinogen, UA: 1 mg/dL (ref 0.0–1.0)
pH: 6 (ref 5.0–8.0)

## 2012-08-17 LAB — BASIC METABOLIC PANEL
GFR calc Af Amer: 69 mL/min — ABNORMAL LOW (ref >90)
GFR calc non Af Amer: 60 mL/min — ABNORMAL LOW (ref >90)
Potassium: 4.6 mEq/L (ref 3.5–5.1)
Sodium: 142 mEq/L (ref 135–145)

## 2012-08-17 LAB — URINE MICROSCOPIC-ADD ON

## 2012-08-17 LAB — PREPARE RBC (CROSSMATCH)

## 2012-08-17 MED ORDER — VITAMIN B-12 1000 MCG PO TABS
1000.0000 ug | ORAL_TABLET | Freq: Every day | ORAL | Status: DC
  Administered 2012-08-18 – 2012-08-25 (×8): 1000 ug via ORAL
  Filled 2012-08-17 (×8): qty 1

## 2012-08-17 MED ORDER — LENALIDOMIDE 5 MG PO CAPS
5.0000 mg | ORAL_CAPSULE | Freq: Every day | ORAL | Status: DC
  Administered 2012-08-19 – 2012-08-25 (×7): 5 mg via ORAL

## 2012-08-17 MED ORDER — INSULIN ASPART 100 UNIT/ML ~~LOC~~ SOLN
0.0000 [IU] | Freq: Three times a day (TID) | SUBCUTANEOUS | Status: DC
  Administered 2012-08-18 – 2012-08-20 (×3): 1 [IU] via SUBCUTANEOUS
  Administered 2012-08-21 – 2012-08-22 (×3): 2 [IU] via SUBCUTANEOUS
  Administered 2012-08-23: 1 [IU] via SUBCUTANEOUS
  Administered 2012-08-23: 2 [IU] via SUBCUTANEOUS
  Administered 2012-08-24 – 2012-08-25 (×3): 1 [IU] via SUBCUTANEOUS
  Administered 2012-08-25: 3 [IU] via SUBCUTANEOUS

## 2012-08-17 MED ORDER — BISACODYL 5 MG PO TBEC
5.0000 mg | DELAYED_RELEASE_TABLET | Freq: Every day | ORAL | Status: DC
  Administered 2012-08-18 – 2012-08-24 (×8): 5 mg via ORAL
  Filled 2012-08-17 (×8): qty 1

## 2012-08-17 MED ORDER — ONDANSETRON HCL 4 MG/2ML IJ SOLN: 4.0000 mg | Freq: Four times a day (QID) | INTRAMUSCULAR | Status: DC | PRN

## 2012-08-17 MED ORDER — IRBESARTAN 150 MG PO TABS
150.0000 mg | ORAL_TABLET | Freq: Every day | ORAL | Status: DC
  Administered 2012-08-18 – 2012-08-25 (×8): 150 mg via ORAL
  Filled 2012-08-17 (×8): qty 1

## 2012-08-17 MED ORDER — METFORMIN HCL 500 MG PO TABS
1000.0000 mg | ORAL_TABLET | Freq: Every day | ORAL | Status: DC
  Filled 2012-08-17 (×2): qty 2

## 2012-08-17 MED ORDER — CEPHALEXIN 500 MG PO CAPS
500.0000 mg | ORAL_CAPSULE | Freq: Three times a day (TID) | ORAL | Status: DC
  Administered 2012-08-18 – 2012-08-20 (×9): 500 mg via ORAL
  Filled 2012-08-17 (×13): qty 1

## 2012-08-17 MED ORDER — MIRABEGRON ER 50 MG PO TB24
50.0000 mg | ORAL_TABLET | Freq: Every day | ORAL | Status: DC
  Administered 2012-08-18 – 2012-08-25 (×8): 50 mg via ORAL
  Filled 2012-08-17 (×8): qty 1

## 2012-08-17 MED ORDER — AMLODIPINE BESYLATE 10 MG PO TABS
10.0000 mg | ORAL_TABLET | Freq: Every day | ORAL | Status: DC
  Administered 2012-08-18 – 2012-08-20 (×3): 10 mg via ORAL
  Filled 2012-08-17 (×4): qty 1

## 2012-08-17 MED ORDER — TROSPIUM CHLORIDE ER 60 MG PO CP24
60.0000 mg | ORAL_CAPSULE | Freq: Every day | ORAL | Status: DC
  Filled 2012-08-17: qty 1

## 2012-08-17 MED ORDER — SODIUM CHLORIDE 0.9 % IJ SOLN: 3.0000 mL | INTRAMUSCULAR | Status: DC | PRN

## 2012-08-17 MED ORDER — SODIUM CHLORIDE 0.9 % IJ SOLN
3.0000 mL | Freq: Two times a day (BID) | INTRAMUSCULAR | Status: DC
  Administered 2012-08-18 – 2012-08-19 (×3): 3 mL via INTRAVENOUS
  Administered 2012-08-19: 12:00:00 via INTRAVENOUS
  Administered 2012-08-20 – 2012-08-25 (×12): 3 mL via INTRAVENOUS

## 2012-08-17 MED ORDER — INSULIN ASPART 100 UNIT/ML ~~LOC~~ SOLN: 0.0000 [IU] | Freq: Every day | SUBCUTANEOUS | Status: DC

## 2012-08-17 MED ORDER — TROSPIUM CHLORIDE ER 60 MG PO CP24: 60.0000 mg | ORAL_CAPSULE | Freq: Every day | ORAL | Status: DC

## 2012-08-17 MED ORDER — VITAMIN C 500 MG PO TABS
1000.0000 mg | ORAL_TABLET | Freq: Every day | ORAL | Status: DC
  Administered 2012-08-18 – 2012-08-25 (×8): 1000 mg via ORAL
  Filled 2012-08-17 (×8): qty 2

## 2012-08-17 MED ORDER — ONDANSETRON HCL 4 MG PO TABS: 4.0000 mg | ORAL_TABLET | Freq: Four times a day (QID) | ORAL | Status: DC | PRN

## 2012-08-17 MED ORDER — GLYBURIDE 5 MG PO TABS
5.0000 mg | ORAL_TABLET | Freq: Two times a day (BID) | ORAL | Status: DC
  Filled 2012-08-17 (×3): qty 1

## 2012-08-17 MED ORDER — SODIUM CHLORIDE 0.9 % IV SOLN
250.0000 mL | INTRAVENOUS | Status: DC | PRN
  Administered 2012-08-18: 1000 mL via INTRAVENOUS

## 2012-08-17 MED ORDER — HYDROCODONE-ACETAMINOPHEN 5-325 MG PO TABS
1.0000 | ORAL_TABLET | ORAL | Status: DC | PRN
  Administered 2012-08-18 (×2): 1 via ORAL
  Filled 2012-08-17 (×2): qty 1

## 2012-08-17 MED ORDER — CEPHALEXIN 500 MG PO CAPS
500.0000 mg | ORAL_CAPSULE | Freq: Once | ORAL | Status: AC
  Administered 2012-08-17: 500 mg via ORAL
  Filled 2012-08-17: qty 1

## 2012-08-17 NOTE — ED Notes (Signed)
Per EMS: Family reports pt has been more confused than normal and suspects a UTI. Pt also c/o left arm pain.

## 2012-08-17 NOTE — ED Notes (Signed)
Dr Conley Rolls at bedside with patient. Dr. Conley Rolls discussed code status with patient. Pt is full code.

## 2012-08-17 NOTE — ED Notes (Signed)
Bed:WA01<BR> Expected date:<BR> Expected time:<BR> Means of arrival:<BR> Comments:<BR> EMS

## 2012-08-17 NOTE — ED Notes (Signed)
CRITICAL VALUE ALERT Critical value received:  Hemoglobin 5.1 Date of notification:  08/17/12 Time of notification:  1913 Critical value read back: yes Nurse who received alert:  Kai Levins, RN MD notified:  Effie Shy Time of notification: [phone]

## 2012-08-17 NOTE — H&P (Signed)
[facility] History and Physical  Aaron Bruce XBJ:[id number] DOB: Jan 12, 1925    PCP:   Michiel Sites, MD   Chief Complaint: Altered mental status and weakness.  HPI: Aaron Bruce is an 79 y.o. male with hx of MDS with 5q deletion, CVA, DM, HTN, thrombocytopenia due to Revlimid, brought in by family originally because he was becoming more combative.  In the ER, however, he was cooperative and was not confused.  Evaluation in the ER showed Hb of 5.1grams per DL, and a UA with 3-6 wbcs.  His renal fx tests were unremarkable and telemetry 128K.  His BS was 220.  Hospitalist was asked to admit patient for anemia requiring transfusion and possible UTI.  Rewiew of Systems:  Constitutional: Negative for malaise, fever and chills. No significant weight loss or weight gain Eyes: Negative for eye pain, redness and discharge, diplopia, visual changes, or flashes of light. ENMT: Negative for ear pain, hoarseness, nasal congestion, sinus pressure and sore throat. No headaches; tinnitus, drooling, or problem swallowing. Cardiovascular: Negative for chest pain, palpitations, diaphoresis, dyspnea and peripheral edema. ; No orthopnea, PND Respiratory: Negative for cough, hemoptysis, wheezing and stridor. No pleuritic chestpain. Gastrointestinal: Negative for nausea, vomiting, diarrhea, constipation, abdominal pain, melena, blood in stool, hematemesis, jaundice and rectal bleeding.    Genitourinary: Negative for frequency, dysuria, incontinence,flank pain and hematuria; Musculoskeletal: Negative for back pain and neck pain. Negative for swelling and trauma.;  Skin: . Negative for pruritus, rash, abrasions, bruising and skin lesion.; ulcerations Neuro: Negative for headache, lightheadedness and neck stiffness. Negative for weakness, altered level of consciousness , altered mental status, extremity weakness, burning feet, involuntary movement, seizure and syncope.  Psych: negative for  anxiety, depression, insomnia, tearfulness, panic attacks, hallucinations, paranoia, suicidal or homicidal ideation    Past Medical History  Diagnosis Date  . Anemia   . Hypertension   . Diabetes mellitus   . Stroke   . Hyperlipemia   . Peripheral vascular disease   . Arthritis     Osteoarthritis  . Irritable bowel syndrome (IBS)   . Myelodysplasia with 5q deletion   . Thrombocytopenia     stable--due to Revlimid    Past Surgical History  Procedure Laterality Date  . Tonsillectomy  1948  . Kidney stone surgery  1950's    Medications:  HOME MEDS: Prior to Admission medications   Medication Sig Start Date End Date Taking? Authorizing Provider  amLODipine (NORVASC) 10 MG tablet Take 10 mg by mouth daily.    Yes Historical Provider, MD  Ascorbic Acid 1000 MG TBCR Take 1 tablet by mouth daily.    Yes Historical Provider, MD  B Complex Vitamins (VITAMIN B COMPLEX PO) Take 1 tablet by mouth daily.    Yes Historical Provider, MD  bisacodyl (DULCOLAX) 5 MG EC tablet Take 5 mg by mouth at bedtime.   Yes Historical Provider, MD  Calcium Carbonate-Vitamin D (CALCIUM 600 + D PO) Take 1 tablet by mouth daily.    Yes Historical Provider, MD  Chromium 200 MCG CAPS Take 1 capsule by mouth daily.    Yes Historical Provider, MD  Ergocalciferol 2000 UNITS TABS Take 1 tablet by mouth daily.    Yes Historical Provider, MD  Flaxseed, Linseed, (FLAXSEED OIL PO) Take 1 capsule by mouth daily.    Yes Historical Provider, MD  Garlic 1000 MG CAPS Take 1 capsule by mouth daily.     Yes Historical Provider, MD  glyBURIDE (DIABETA) 5 MG tablet Take  5 mg by mouth 2 (two) times daily with a meal.    Yes Historical Provider, MD  HM CINNAMON PO Take 1,000 mg by mouth daily.    Yes Historical Provider, MD  HYDROcodone-acetaminophen (NORCO/VICODIN) 5-325 MG per tablet Take 1 tablet by mouth Every 6 hours as needed. For pain 07/15/12  Yes Historical Provider, MD  Lecithin 1200 MG CAPS Take 1 capsule by mouth  daily.     Yes Historical Provider, MD  Magnesium 250 MG TABS Take 1 tablet by mouth daily.     Yes Historical Provider, MD  metFORMIN (GLUCOPHAGE) 1000 MG tablet Take 1,000 mg by mouth daily.    Yes Historical Provider, MD  Multiple Vitamins-Minerals (CENTRUM SILVER PO) Take 1 tablet by mouth daily.     Yes Historical Provider, MD  MYRBETRIQ 50 MG TB24 Take 50 mg by mouth Daily. 04/24/12  Yes Historical Provider, MD  niacin 500 MG tablet Take 500 mg by mouth daily with breakfast.     Yes Historical Provider, MD  NUTRITIONAL SUPPLEMENTS PO Take by mouth daily. Wheatgrass, Mineral Powder, Prostate Formula, ArthroZyme Joint & Muscle, Ultimate BP, Natto-K, Ultimate Bone Support, Tri-Boron,Sustanol (testosterone supplement), Vitamin/herb for energy    Yes Historical Provider, MD  Omega-3 Fatty Acids (FISH OIL CONCENTRATE) 1000 MG CAPS Take 1 capsule by mouth daily.    Yes Historical Provider, MD  REVLIMID 5 MG capsule Take 1 capsule (5 mg total) by mouth daily. 08/13/12  Yes Ladene Artist, MD  Selenium 200 MCG CAPS Take 1 capsule by mouth daily.     Yes Historical Provider, MD  telmisartan (MICARDIS) 40 MG tablet Take 40 mg by mouth daily.     Yes Historical Provider, MD  Trospium Chloride 60 MG CP24 Take 60 mg by mouth Daily. 03/03/12  Yes Historical Provider, MD  vitamin B-12 (CYANOCOBALAMIN) 1000 MCG tablet Take 1,000 mcg by mouth daily.     Yes Historical Provider, MD     Allergies:  No Known Allergies  Social History:   reports that he does not drink alcohol or use illicit drugs. His tobacco history is not on file.  Family History: No family history on file.   Physical Exam: Filed Vitals:   08/17/12 1701 08/17/12 1705 08/17/12 2156  BP: 115/53  126/53  Pulse: 95  88  Temp: 98.3 F (36.8 C)  98.6 F (37 C)  TempSrc: Oral  Oral  Resp: 24  22  SpO2: 86% 94% 96%   Blood pressure 126/53, pulse 88, temperature 98.6 F (37 C), temperature source Oral, resp. rate 22, SpO2  96.00%.  GEN:  Pleasant  patient lying in the stretcher in no acute distress; cooperative with exam. PSYCH:  alert and oriented x4; does not appear anxious or depressed; affect is appropriate. HEENT: Mucous membranes pink and anicteric; PERRLA; EOM intact; no cervical lymphadenopathy nor thyromegaly or carotid bruit; no JVD; There were no stridor. Neck is very supple. Breasts:: Not examined CHEST WALL: No tenderness CHEST: Normal respiration, clear to auscultation bilaterally.  HEART: Regular rate and rhythm.  There is a 2/6 SEM at the LSB no rub or gallops. BACK: No kyphosis or scoliosis; no CVA tenderness ABDOMEN: soft and non-tender; no masses, no organomegaly, normal abdominal bowel sounds; no pannus; no intertriginous candida. There is no rebound and no distention. Rectal Exam: Not done EXTREMITIES: No bone or joint deformity; age-appropriate arthropathy of the hands and knees; no edema; no ulcerations.  There is no calf tenderness. Genitalia: not examined PULSES:  2+ and symmetric SKIN: Normal hydration no rash or ulceration CNS: Cranial nerves 2-12 grossly intact no focal lateralizing neurologic deficit.  Speech is fluent; uvula elevated with phonation, facial symmetry and tongue midline. DTR are normal bilaterally, cerebella exam is intact, barbinski is negative and strengths are equaled bilaterally.  No sensory loss.   Labs on Admission:  Basic Metabolic Panel:  Recent Labs Lab 08/17/12 1815  NA 142  K 4.6  CL 106  CO2 26  GLUCOSE 220*  BUN 30*  CREATININE 1.09  CALCIUM 8.5   Liver Function Tests: No results found for this basename: AST, ALT, ALKPHOS, BILITOT, PROT, ALBUMIN,  in the last 168 hours No results found for this basename: LIPASE, AMYLASE,  in the last 168 hours No results found for this basename: AMMONIA,  in the last 168 hours CBC:  Recent Labs Lab 08/17/12 1815  WBC 4.9  NEUTROABS 2.3  HGB 5.1*  HCT 15.1*  MCV 93.2  PLT 128*   Cardiac Enzymes: No  results found for this basename: CKTOTAL, CKMB, CKMBINDEX, TROPONINI,  in the last 168 hours  CBG: No results found for this basename: GLUCAP,  in the last 168 hours   Radiological Exams on Admission: No results found.   Assessment/Plan Present on Admission:  . MDS (myelodysplastic syndrome) with 5q deletion . Anemia requiring transfusions . DM2 (diabetes mellitus, type 2) . Urinary tract infection   PLAN:  This patient has known MDS requiring episodic transfusion.  His agitation (AMS) may have been hypoxemia.  He is doing well with normal sensorium and is no longer agitated.  Will admit him for transfusion.  He was written for 2 units of PRBC, will follow another H/H in the am.  He will continue his PO Keflex for possible UTI.  For his DM, I have continued his oral hypoglycemia agents and place him on low dose insulin sliding scale.  I have also continued his home meds.  I discussed his code status and confirmed that he would like to be a full code.  Will admit him to [facility] service.  Thank you for allowing me to partake in the care of your nice patient.    Other plans as per orders.  Code Status: FULL Aaron Lightning, MD. [facility] Pager [phone] 7pm to 7am.  08/17/2012, 10:26 PM
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
## 2012-08-18 ENCOUNTER — Observation Stay (HOSPITAL_COMMUNITY): Payer: Medicare Other

## 2012-08-18 ENCOUNTER — Other Ambulatory Visit: Payer: Self-pay

## 2012-08-18 ENCOUNTER — Encounter (HOSPITAL_COMMUNITY): Payer: Self-pay | Admitting: Internal Medicine

## 2012-08-18 DIAGNOSIS — R0902 Hypoxemia: Secondary | ICD-10-CM

## 2012-08-18 DIAGNOSIS — R0600 Dyspnea, unspecified: Secondary | ICD-10-CM | POA: Diagnosis not present

## 2012-08-18 DIAGNOSIS — R0609 Other forms of dyspnea: Secondary | ICD-10-CM

## 2012-08-18 DIAGNOSIS — R0989 Other specified symptoms and signs involving the circulatory and respiratory systems: Secondary | ICD-10-CM

## 2012-08-18 LAB — BASIC METABOLIC PANEL
CO2: 26 mEq/L (ref 19–32)
Calcium: 8.6 mg/dL (ref 8.4–10.5)
Creatinine, Ser: 1.01 mg/dL (ref 0.50–1.35)
GFR calc non Af Amer: 66 mL/min — ABNORMAL LOW (ref >90)

## 2012-08-18 LAB — CBC
MCH: 31.5 pg (ref 26.0–34.0)
MCHC: 35.1 g/dL (ref 30.0–36.0)
MCV: 89.7 fL (ref 78.0–100.0)
Platelets: 116 10*3/uL — ABNORMAL LOW (ref 150–400)
RDW: 19.3 % — ABNORMAL HIGH (ref 11.5–15.5)

## 2012-08-18 LAB — HEMOGLOBIN A1C
Hgb A1c MFr Bld: 5.9 % — ABNORMAL HIGH (ref <5.7)
Mean Plasma Glucose: 123 mg/dL — ABNORMAL HIGH (ref <117)

## 2012-08-18 LAB — URINE CULTURE: Colony Count: 70000

## 2012-08-18 LAB — GLUCOSE, CAPILLARY: Glucose-Capillary: 124 mg/dL — ABNORMAL HIGH (ref 70–99)

## 2012-08-18 LAB — TROPONIN I
Troponin I: 1.1 ng/mL (ref <0.30)
Troponin I: 1.06 ng/mL (ref <0.30)

## 2012-08-18 LAB — PREPARE RBC (CROSSMATCH)

## 2012-08-18 MED ORDER — ASPIRIN 325 MG PO TABS
325.0000 mg | ORAL_TABLET | Freq: Every day | ORAL | Status: DC
  Administered 2012-08-19 – 2012-08-25 (×7): 325 mg via ORAL
  Filled 2012-08-18 (×8): qty 1

## 2012-08-18 MED ORDER — FUROSEMIDE 10 MG/ML IJ SOLN
40.0000 mg | Freq: Once | INTRAMUSCULAR | Status: AC
  Administered 2012-08-18: 40 mg via INTRAVENOUS
  Filled 2012-08-18: qty 4

## 2012-08-18 MED ORDER — FUROSEMIDE 10 MG/ML IJ SOLN
40.0000 mg | Freq: Once | INTRAMUSCULAR | Status: DC
Start: 2012-08-18 — End: 2012-08-20
  Filled 2012-08-18: qty 4

## 2012-08-18 NOTE — Progress Notes (Signed)
CRITICAL VALUE ALERT  Critical value received:  Troponin 1.10  Date of notification:  08/18/2012   Time of notification:  2:09 PM   Critical value read back:yes  Nurse who received alert:  Armanda Heritage   MD notified (1st page):  Dr Rito Ehrlich  Time of first page:  2:10 PM   MD notified (2nd page):  Time of second page:  Responding MD:  Rito Ehrlich  Time MD responded:  2:22 PM

## 2012-08-18 NOTE — Care Management (Signed)
CARE MANAGEMENT NOTE 08/18/2012  Patient:  MEYER, ARORA   Account Number:  [id number]  Date Initiated:  08/18/2012  Documentation initiated by:  Arvie Bartholomew  Subjective/Objective Assessment:   79 yo male admitted with anemia. PTA pt lived home alone. Spouse currently in rehab.     Action/Plan:   SNF vs Home with [facility] services.   Anticipated DC Date:  08/21/2012   Anticipated DC Plan:  SKILLED NURSING FACILITY  In-house referral  Clinical Social Worker      DC Planning Services  CM consult      Choice offered to / List presented to:  NA           Status of service:  In process, will continue to follow Medicare Important Message given?   (If response is "NO", the following Medicare IM given date fields will be blank) Date Medicare IM given:   Date Additional Medicare IM given:    Discharge Disposition:    Per UR Regulation:  Reviewed for med. necessity/level of care/duration of stay  If discussed at Long Length of Stay Meetings, dates discussed:    Comments:  08/18/12 1138 Jobany Montellano,RN,BSN, [phone] Cm spoke with pt at bedside concerning discharge planning with adult daughter present in room. Pt states currently living alone, spouse is in rehab. Awaiting PT eval. Per pt would like to return home. Currently active with [facility] for Wound Care. Per daughter desires PT & HHA added to [facility] services upon discharge. Pt states will consider discharge to SNF upon PT eval.

## 2012-08-18 NOTE — Progress Notes (Signed)
2145 CBG was 124 and NT accidentally hit reject after obtaining - 124 is correct

## 2012-08-18 NOTE — Progress Notes (Signed)
Patient received from fifth floor via bed. Alert and oriented, 0/10 pain, O2 at 2L via nasal cannula, NSL present right FA, Condom catheter to straight drainage. Telemetry monitor #41 , verified with [id number], receiving signal. Patient given po fluids, explained ordering meals, call bell, and room orientation. Daughter at bedside, agree with previous RN's assessment, will chart new or different findings as needed.

## 2012-08-18 NOTE — Progress Notes (Signed)
Hypoglycemic Event  CBG: 62  Treatment: 15 GM carbohydrate snack  Symptoms: None  Follow-up CBG: Time:0806 CBG Result:71  Possible Reasons for Event: Unknown  Comments/MD notified:    Aaron Bruce  Remember to initiate Hypoglycemia Order Set & complete

## 2012-08-18 NOTE — Progress Notes (Signed)
[facility] PROGRESS NOTE  Aaron Bruce UJW:[id number] DOB: [birth year] DOA: 08/17/2012  PCP: Michiel Sites, MD  Brief HPI: Aaron Bruce is an 79 y.o. male with hx of MDS with 5q deletion, CVA, DM, HTN, thrombocytopenia due to Revlimid, brought in by family originally because he was becoming more combative. In the ER, however, he was cooperative and was not confused. Evaluation in the ER showed Hb of 5.1grams per DL, and a UA with 3-6 wbcs. His renal fx tests were unremarkable and telemetry 128K. His BS was 220. Hospitalist was asked to admit patient for anemia requiring transfusion and possible UTI.   Past medical history:  Past Medical History  Diagnosis Date  . Anemia   . Hypertension   . Diabetes mellitus   . Stroke   . Hyperlipemia   . Peripheral vascular disease   . Arthritis     Osteoarthritis  . Irritable bowel syndrome (IBS)   . Myelodysplasia with 5q deletion   . Thrombocytopenia     stable--due to Revlimid    Consultants: None  Procedures: Blood Transfusion  Antibiotics: Keflex  Subjective: Patient admits to shortness of breath this morning. Denies chest pain. No nausea or vomiting.  Objective: Vital Signs  Filed Vitals:   08/18/12 0430 08/18/12 0448 08/18/12 0530 08/18/12 0635  BP: 123/57 123/54 120/56 129/41  Pulse: 79 76 74 76  Temp: 98.7 F (37.1 C) 98.7 F (37.1 C) 98.5 F (36.9 C) 98.4 F (36.9 C)  TempSrc:  Oral Oral Oral  Resp: 20 18 18 18   Height:      SpO2: 92% 98% 96% 94%    Intake/Output Summary (Last 24 hours) at 08/18/12 1118 Last data filed at 08/18/12 1042  Gross per 24 hour  Intake 1555.5 ml  Output    775 ml  Net  780.5 ml   There were no vitals filed for this visit.  Intake/Output from previous day: 02/17 0701 - 02/18 0700 In: 1189.5 [P.O.:320; I.V.:19.5; Blood:850] Out: 575 [Urine:575]  General appearance: alert, cooperative, appears stated age and no distress Head: Normocephalic, without obvious  abnormality, atraumatic Back: symmetric, no curvature. ROM normal. No CVA tenderness. Resp: Fine crackles bilateral lungs. No wheezing. Cardio: regular rate and rhythm, S1, S2 normal, no murmur, click, rub or gallop GI: soft, non-tender; bowel sounds normal; no masses,  no organomegaly Extremities: edema 1+ Neurologic: Alert. Oriented to person, place and time. No focal deficits.  Lab Results:  Basic Metabolic Panel:  Recent Labs Lab 08/17/12 1815 08/18/12 0820  NA 142 143  K 4.6 4.2  CL 106 106  CO2 26 26  GLUCOSE 220* 107*  BUN 30* 24*  CREATININE 1.09 1.01  CALCIUM 8.5 8.6   CBC:  Recent Labs Lab 08/17/12 1815 08/18/12 0820  WBC 4.9 6.8  NEUTROABS 2.3  --   HGB 5.1* 7.3*  HCT 15.1* 20.8*  MCV 93.2 89.7  PLT 128* 116*   CBG:  Recent Labs Lab 08/18/12 0057 08/18/12 0748 08/18/12 0806  GLUCAP 124* 62* 71     Studies/Results: No results found.  Medications:  Scheduled: . amLODipine  10 mg Oral Daily  . bisacodyl  5 mg Oral QHS  . cephALEXin  500 mg Oral Q8H  . insulin aspart  0-5 Units Subcutaneous QHS  . insulin aspart  0-9 Units Subcutaneous TID WC  . irbesartan  150 mg Oral Daily  . lenalidomide  5 mg Oral Daily  . mirabegron ER  50 mg Oral Daily  .  sodium chloride  3 mL Intravenous Q12H  . Trospium Chloride  60 mg Oral Daily  . vitamin B-12  1,000 mcg Oral Daily  . vitamin C  1,000 mg Oral Daily   Continuous:  UYQ:IHKVQQ chloride, HYDROcodone-acetaminophen, ondansetron (ZOFRAN) IV, ondansetron, sodium chloride  Assessment/Plan:  Principal Problem:   Anemia requiring transfusions Active Problems:   MDS (myelodysplastic syndrome) with 5q deletion   DM2 (diabetes mellitus, type 2)   Urinary tract infection   L5 vertebral fracture   HTN (hypertension), benign   Hyperlipidemia    Dyspnea with hypoxia Could be fluid overload due to transfusion. Last ECHO from 2011 showed normal EF. Will get CXR, EKG and check pro-bnp. Denies chest  pain. Troponin. Has LE edema. Will likely need Lasix.  MDS Chronic anemia. Requires transfusions periodically. Was transfused 2 units. Hgb has responded.  DM2 Hypoglycemic this morning. Holding his medications. Check HBA1c.  Abnormal UA/UTI Await cultures. On Keflex.  L5 Fracture S/P kyphoplasty recently  History of HTN Stable. Monitor BP closely.  Code Status: Full Code DVT Prophylaxis: SCD Family Communication: Daughter at bedside  Disposition Plan: Cannot be discharged today due to hypoxia and dyspnea.    LOS: 1 day   [facility]  [facility] Pager [phone] 08/18/2012, 11:18 AM  If 8PM-8AM, please contact night-coverage at [email or web address], password [facility]

## 2012-08-18 NOTE — Consult Note (Signed)
Admit date: 08/17/2012 Referring Physician  Dr. Rito Ehrlich Primary Physician Michiel Sites, MD Primary Cardiologist  Dr. Mayford Knife  Reason for Consultation  Elevated troponin  HPI: 79 year old male with myelodysplastic syndrome, prior stroke, diabetes, thrombocytopenia, hypertension who was admitted on 08/17/12 with altered mental status, weakness, confusion. Once the emergency department, he was cooperative and not confused. His hemoglobin in the emergency room was 5.1. BNP was checked and was elevated at 6600. A troponin was checked earlier today at 12:50 PM and was 1.1. Creatinine is 1.01. Hemoglobin this morning was 7.3 up from 5.1. Platelet count is 116.  In talking with him, he is not complaining of any shortness of breath, no chest pain, no syncope. I am not quite sure what his baseline mental status is. EKG demonstrates sinus rhythm with PACs, nonspecific ST segment changes. No evidence of ST segment elevation. Once again, he is currently appearing comfortable laying quite flat.  He has had a prior cardiovascular evaluation in 2011 following postoperative bradycardia by Dr. Mayford Knife. Ejection fraction at that time was normal.  He has chronic anemia and requires transfusions periodically. He has a history of recent kyphoplasty.    PMH:   Past Medical History  Diagnosis Date  . Anemia   . Hypertension   . Diabetes mellitus   . Stroke   . Hyperlipemia   . Peripheral vascular disease   . Arthritis     Osteoarthritis  . Irritable bowel syndrome (IBS)   . Myelodysplasia with 5q deletion   . Thrombocytopenia     stable--due to Revlimid    PSH:   Past Surgical History  Procedure Laterality Date  . Tonsillectomy  1948  . Kidney stone surgery  1950's   Allergies:  Review of patient's allergies indicates no known allergies. Prior to Admit Meds:   Prescriptions prior to admission  Medication Sig Dispense Refill  . amLODipine (NORVASC) 10 MG tablet Take 10 mg by mouth daily.        . Ascorbic Acid 1000 MG TBCR Take 1 tablet by mouth daily.       . B Complex Vitamins (VITAMIN B COMPLEX PO) Take 1 tablet by mouth daily.       . bisacodyl (DULCOLAX) 5 MG EC tablet Take 5 mg by mouth at bedtime.      . Calcium Carbonate-Vitamin D (CALCIUM 600 + D PO) Take 1 tablet by mouth daily.       . Chromium 200 MCG CAPS Take 1 capsule by mouth daily.       . Ergocalciferol 2000 UNITS TABS Take 1 tablet by mouth daily.       . Flaxseed, Linseed, (FLAXSEED OIL PO) Take 1 capsule by mouth daily.       . Garlic 1000 MG CAPS Take 1 capsule by mouth daily.        Marland Kitchen glyBURIDE (DIABETA) 5 MG tablet Take 5 mg by mouth 2 (two) times daily with a meal.       . HM CINNAMON PO Take 1,000 mg by mouth daily.       Marland Kitchen HYDROcodone-acetaminophen (NORCO/VICODIN) 5-325 MG per tablet Take 1 tablet by mouth Every 6 hours as needed. For pain      . Lecithin 1200 MG CAPS Take 1 capsule by mouth daily.        . Magnesium 250 MG TABS Take 1 tablet by mouth daily.        . metFORMIN (GLUCOPHAGE) 1000 MG tablet Take 1,000 mg by mouth daily.       Marland Kitchen  Multiple Vitamins-Minerals (CENTRUM SILVER PO) Take 1 tablet by mouth daily.        Marland Kitchen MYRBETRIQ 50 MG TB24 Take 50 mg by mouth Daily.      . niacin 500 MG tablet Take 500 mg by mouth daily with breakfast.        . NUTRITIONAL SUPPLEMENTS PO Take by mouth daily. Wheatgrass, Mineral Powder, Prostate Formula, ArthroZyme Joint & Muscle, Ultimate BP, Natto-K, Ultimate Bone Support, Tri-Boron,Sustanol (testosterone supplement), Vitamin/herb for energy       . Omega-3 Fatty Acids (FISH OIL CONCENTRATE) 1000 MG CAPS Take 1 capsule by mouth daily.       Marland Kitchen REVLIMID 5 MG capsule Take 1 capsule (5 mg total) by mouth daily.  28 capsule  0  . Selenium 200 MCG CAPS Take 1 capsule by mouth daily.        Marland Kitchen telmisartan (MICARDIS) 40 MG tablet Take 40 mg by mouth daily.        . Trospium Chloride 60 MG CP24 Take 60 mg by mouth Daily.      . vitamin B-12 (CYANOCOBALAMIN) 1000 MCG tablet  Take 1,000 mcg by mouth daily.         Fam HX:   No family history on file. noncontributory Social HX:    History   Social History  . Marital Status: Married    Spouse Name: N/A    Number of Children: N/A  . Years of Education: N/A   Occupational History  . Not on file.   Social History Main Topics  . Smoking status: Never Smoker   . Smokeless tobacco: Not on file  . Alcohol Use: No  . Drug Use: No  . Sexually Active: Not on file   Other Topics Concern  . Not on file   Social History Narrative  . No narrative on file     ROS:  All 11 ROS were addressed and are negative except what is stated in the HPI  Physical Exam: Blood pressure 126/63, pulse 60, temperature 98 F (36.7 C), temperature source Oral, resp. rate 20, height 5\' 6"  (1.676 m), SpO2 97.00%.    General: Elderly, laying comfortable flat, very polite, in no acute distress Head: Eyes PERRLA, No xanthomas.   Normal cephalic and atramatic  Lungs:   Clear bilaterally to auscultation and percussion. Normal respiratory effort. No wheezes, no rales. Heart:   HRRR S1 S2 Pulses are 2+ & equal. Soft systolic murmur left upper sternal border            No carotid bruit. No JVD.  No abdominal bruits.  Abdomen: Bowel sounds are positive, abdomen soft and non-tender without masses. No hepatosplenomegaly. Msk:  Back normal. Normal strength and tone for age. Extremities:   No clubbing, cyanosis or edema.  DP +1 Neuro: Alert, non-focal, MAE x 4 GU: Deferred Rectal: Deferred Psych:  Good affect, pleasant    Labs:   Lab Results  Component Value Date   WBC 6.8 08/18/2012   HGB 7.3* 08/18/2012   HCT 20.8* 08/18/2012   MCV 89.7 08/18/2012   PLT 116* 08/18/2012    Recent Labs Lab 08/18/12 0820  NA 143  K 4.2  CL 106  CO2 26  BUN 24*  CREATININE 1.01  CALCIUM 8.6  GLUCOSE 107*   No results found for this basename: PTT   Lab Results  Component Value Date   INR 1.03 07/31/2012   INR 1.03 04/16/2012   INR 1.04  07/31/2011   Lab  Results  Component Value Date   CKTOTAL 35 10/08/2009   CKMB 1.2 10/08/2009   TROPONINI 1.10* 08/18/2012      Radiology:  Dg Chest Port 1 View  08/18/2012  *RADIOLOGY REPORT*  Clinical Data: Shortness of breath.  History of hypertension  PORTABLE CHEST - 1 VIEW  Comparison: None.  Findings: Low lung volumes are identified.  Heart size is upper limits of normal.  Mild aortic ectasia with thoracic aortic calcification is noted.  Low lung volumes are present.  An area of focal density is identified at the left lung base with question small left pleural effusion and septal interstitial lines.  This could represent an area of focal pneumonia however if the patient lays predominately with the left side down this could represent an area of focal alveolar edema.  No stigmata suggestive of interstitial edema is seen elsewhere in the lungs which are relatively clear with exception of some focal volume loss at the right lung base.  Bony structures demonstrate age appropriate osteopenia.  IMPRESSION: Left lower lobe density could represent either pneumonia atelectasis or positional edema.   Original Report Authenticated By: Rhodia Albright, M.D.    Personally viewed.  EKG:  As described above in history of present illness Personally viewed.  ECHO: 2011:  - Left ventricle: The cavity size was normal. Systolic function was normal. The estimated ejection fraction was in the range of 55% to 60%. Possible mild hypokinesis of the distal apical myocardium. - Aortic valve: Trivial regurgitation.   ASSESSMENT/PLAN:   79 year old male with myelodysplastic syndrome, recent severe anemia hemoglobin of 5, with recent altered mental status, confusion, reported dyspnea with elevated troponin.  1. Elevated troponin-likely type II non-ST elevation myocardial infarction as a result of his severe anemia and underlying illness. It is likely that he does have a degree of coronary artery disease given his age,  risk factors and prior PVD diagnosis. His underlying illness however as well as severe anemia and myelodysplastic syndrome does not allow for invasive management, i.e. cardiac catheterization in this situation, nor does allow for use of IV heparin or other antithrombotic or antiplatelet agents (ASA etc..). Even use a beta blocker in this situation may be challenging because of his underlying previous bradycardia. I would be very hesitant to utilize this medication. He is currently on an angiotensin receptor blocker, irbesartan, 150 mg a day. Continue with this. It is also not unreasonable to continue with furosemide 40 mg by mouth daily. He currently appears quite comfortable from a breathing perspective. Try to maintain hemoglobin greater than 7. I will order echocardiogram to evaluate his overall ejection fraction. His severe anemia may have led to high output heart failure as well. If able, consider statin therapy.  We will follow up with echocardiogram. Elevated troponin in this setting portends a worsened prognosis with his overall complex disease processes.  Donato Schultz, MD  08/18/2012  3:00 PM

## 2012-08-18 NOTE — Progress Notes (Signed)
Patient received from third floor via bed.  Alert and oriented, 0/10 pain, O2 at 2L via nasal cannula, NSL present right FA,  Condom catheter to straight drainage.  Telemetry monitor #50 , verified with [id number], receiving signal.  Patient given po fluids, explained ordering meals, call bell, and room orientation.

## 2012-08-18 NOTE — Telephone Encounter (Signed)
RECEIVED A FAX FROM [facility] CONCERNING A CONFIRMATION OF PRESCRIPTION SHIPMENT FOR REVLIMID ON 08/17/12. 

## 2012-08-18 NOTE — Progress Notes (Addendum)
Called for Elevated Troponin.  Patient denies chest pain. Breathing is about the same. Received Lasix a little while ago.  EKG does not show any ST elevation. Non specific changes noted. CXR shows left lower lobe density.  He has elevated BNP. His examination is consistent with fluid overload. This could all be secondary to severe anemia. Patient doesn't have a cardiologist. He was seen by Dr. Armanda Magic in 2011 for post procedure bradycardia. ECHO at that time showed normal EF with mild hypokinesis of distal apical myocardium.  Give Aspirin Will consult cardiology. Called [facility] Hold off on Anti-coagulation till seen by cards Transfer to tele floor. Will transfuse another unit of blood for now. Lasix post transfusion. Consider metoprolol but hear rate noted to be 60.  Await cardiology input.  Discussed with patient's daughter. Will monitor.  Aaron Bruce 2:38 PM

## 2012-08-18 NOTE — Progress Notes (Addendum)
Admitted to room 1311 from ED with Anemia. 2 units of PRBC's ordered. VSS. C/o pain in left arm (where had flu shot few weeks ago) . Oriented to room and unit.

## 2012-08-19 ENCOUNTER — Telehealth: Payer: Self-pay | Admitting: *Deleted

## 2012-08-19 LAB — TROPONIN I: Troponin I: 0.97 ng/mL (ref <0.30)

## 2012-08-19 LAB — CBC
HCT: 21.9 % — ABNORMAL LOW (ref 39.0–52.0)
MCH: 30.7 pg (ref 26.0–34.0)
MCHC: 34.2 g/dL (ref 30.0–36.0)
MCV: 89.8 fL (ref 78.0–100.0)
Platelets: 100 10*3/uL — ABNORMAL LOW (ref 150–400)
RDW: 17.6 % — ABNORMAL HIGH (ref 11.5–15.5)
WBC: 5.1 10*3/uL (ref 4.0–10.5)

## 2012-08-19 LAB — BASIC METABOLIC PANEL
BUN: 29 mg/dL — ABNORMAL HIGH (ref 6–23)
Calcium: 8.2 mg/dL — ABNORMAL LOW (ref 8.4–10.5)
Chloride: 102 mEq/L (ref 96–112)
Creatinine, Ser: 1.11 mg/dL (ref 0.50–1.35)
GFR calc Af Amer: 68 mL/min — ABNORMAL LOW (ref >90)

## 2012-08-19 LAB — GLUCOSE, CAPILLARY
Glucose-Capillary: 124 mg/dL — ABNORMAL HIGH (ref 70–99)
Glucose-Capillary: 71 mg/dL (ref 70–99)
Glucose-Capillary: 115 mg/dL — ABNORMAL HIGH (ref 70–99)

## 2012-08-19 MED ORDER — FUROSEMIDE 10 MG/ML IJ SOLN
40.0000 mg | Freq: Once | INTRAMUSCULAR | Status: AC
  Administered 2012-08-20: 40 mg via INTRAVENOUS
  Filled 2012-08-19: qty 4

## 2012-08-19 MED ORDER — DM-GUAIFENESIN ER 30-600 MG PO TB12
1.0000 | ORAL_TABLET | Freq: Two times a day (BID) | ORAL | Status: DC
  Administered 2012-08-20: 02:00:00 via ORAL
  Administered 2012-08-20 – 2012-08-22 (×5): 1 via ORAL
  Administered 2012-08-22 – 2012-08-23 (×2): via ORAL
  Administered 2012-08-23 – 2012-08-24 (×2): 1 via ORAL
  Administered 2012-08-24 – 2012-08-25 (×2): via ORAL
  Filled 2012-08-19 (×13): qty 1

## 2012-08-19 MED ORDER — LEVALBUTEROL HCL 0.63 MG/3ML IN NEBU
0.6300 mg | INHALATION_SOLUTION | Freq: Three times a day (TID) | RESPIRATORY_TRACT | Status: DC
  Administered 2012-08-20: 0.63 mg via RESPIRATORY_TRACT
  Filled 2012-08-19 (×5): qty 3

## 2012-08-19 NOTE — Progress Notes (Addendum)
[facility] PROGRESS NOTE  Aaron Bruce WJX:[id number] DOB: 1924/10/16 DOA: 08/17/2012  PCP: Michiel Sites, MD  Brief HPI: Aaron Bruce is an 79 y.o. male with hx of MDS with 5q deletion, CVA, DM, HTN, thrombocytopenia due to Revlimid, brought in by family originally because he was becoming more combative. In the ER, however, he was cooperative and was not confused. Evaluation in the ER showed Hb of 5.1grams per DL, and a UA with 3-6 wbcs. His renal fx tests were unremarkable and telemetry 128K. His BS was 220. Hospitalist was asked to admit patient for anemia requiring transfusion and possible UTI.   Assessment/Plan:  Dyspnea with hypoxia Could be fluid overload due to transfusion.  -BNP elelvated, echo today 2/19 with EF 55-60%, No diagnostic wall motion abnl -CXR 2/18 with  positional edema vs PNA -less likely PNA as pt is afebrile wwith no leuckocytosis  -will give another dose of lasix, follow recheck xray in am and further treat accordingly MDS with severe Anemia Chronic anemia. Requires transfusions periodically. Was transfused 2 units on 2/18 and Hgb improved. -hgb7.5 today, follow and recheck in am  NSTEMI with frequent Pauses -Per cards 2/2 demand ischemia due to severe anemia -echo as above with EF 55-60%, No diagnostic wall motion abnl -appreciate cards assistance  DM2 Hypoglycemic this morning. Holding his medications. Check HBA1c.  Abnormal UA/UTI Urine cultures only with multiple bacterial morphotypes. On Keflex- last dose due tomorrow, follow.  L5 Fracture S/P kyphoplasty recently  History of HTN Stable. Monitor BP closely.  Code Status: Full Code DVT Prophylaxis: SCD Family Communication: Daughter at bedside  Disposition Plan: pending PT EVAL   Consultants: cardiology- Dr Eldridge Dace  Procedures: Blood Transfusion  Antibiotics: Keflex  Subjective:  admits to shortness of breath this morning and reports cough. Denies chest pain. Per  nsg having frequent pauses on tele overnight and through this am  Objective: Vital Signs  Filed Vitals:   08/19/12 0025 08/19/12 0546 08/19/12 1034 08/19/12 1400  BP: 130/58 122/60 124/51 135/49  Pulse: 80 72 72 75  Temp: 99.7 F (37.6 C) 98.4 F (36.9 C) 98.7 F (37.1 C) 98.6 F (37 C)  TempSrc: Oral Oral Oral Oral  Resp: 20 19 20 20   Height:      Weight:  75.3 kg (166 lb 0.1 oz)    SpO2:  98% 97% 97%    Intake/Output Summary (Last 24 hours) at 08/19/12 1753 Last data filed at 08/19/12 1649  Gross per 24 hour  Intake   1310 ml  Output   1350 ml  Net    -40 ml   Filed Weights   08/17/12 2255 08/19/12 0546  Weight: 65.318 kg (144 lb) 75.3 kg (166 lb 0.1 oz)    Intake/Output from previous day: 02/18 0701 - 02/19 0700 In: 1556 [P.O.:1200; I.V.:6; Blood:350] Out: 1150 [Urine:1150]  General appearance: alert, cooperative, appears stated age and no distress Head: Normocephalic, without obvious abnormality, atraumatic Back: symmetric, no curvature. ROM normal. No CVA tenderness. Resp: scattered rhonchi bilateral lungs, and basilar crackles. No wheezing. Cardio: regular rate and rhythm, S1, S2 normal, no murmur, click, rub or gallop GI: soft, non-tender; bowel sounds normal; no masses,  no organomegaly Extremities: no edema Neurologic: Alert. Oriented to person, place and time. No focal deficits.  Lab Results:  Basic Metabolic Panel:  Recent Labs Lab 08/17/12 1815 08/18/12 0820 08/19/12 0210  NA 142 143 138  K 4.6 4.2 4.4  CL 106 106 102  CO2  26 26 27   GLUCOSE 220* 107* 84  BUN 30* 24* 29*  CREATININE 1.09 1.01 1.11  CALCIUM 8.5 8.6 8.2*   CBC:  Recent Labs Lab 08/17/12 1815 08/18/12 0820 08/19/12 0210  WBC 4.9 6.8 5.1  NEUTROABS 2.3  --   --   HGB 5.1* 7.3* 7.5*  HCT 15.1* 20.8* 21.9*  MCV 93.2 89.7 89.8  PLT 128* 116* 100*   CBG:  Recent Labs Lab 08/18/12 1829 08/18/12 2157 08/19/12 0755 08/19/12 1121 08/19/12 1641  GLUCAP 131* 124* 71  115* 88     Studies/Results: Dg Chest Port 1 View  08/18/2012  *RADIOLOGY REPORT*  Clinical Data: Shortness of breath.  History of hypertension  PORTABLE CHEST - 1 VIEW  Comparison: None.  Findings: Low lung volumes are identified.  Heart size is upper limits of normal.  Mild aortic ectasia with thoracic aortic calcification is noted.  Low lung volumes are present.  An area of focal density is identified at the left lung base with question small left pleural effusion and septal interstitial lines.  This could represent an area of focal pneumonia however if the patient lays predominately with the left side down this could represent an area of focal alveolar edema.  No stigmata suggestive of interstitial edema is seen elsewhere in the lungs which are relatively clear with exception of some focal volume loss at the right lung base.  Bony structures demonstrate age appropriate osteopenia.  IMPRESSION: Left lower lobe density could represent either pneumonia atelectasis or positional edema.   Original Report Authenticated By: Rhodia Albright, M.D.     Medications:  Scheduled: . amLODipine  10 mg Oral Daily  . aspirin  325 mg Oral Daily  . bisacodyl  5 mg Oral QHS  . cephALEXin  500 mg Oral Q8H  . furosemide  40 mg Intravenous Once  . insulin aspart  0-5 Units Subcutaneous QHS  . insulin aspart  0-9 Units Subcutaneous TID WC  . irbesartan  150 mg Oral Daily  . lenalidomide  5 mg Oral Daily  . mirabegron ER  50 mg Oral Daily  . sodium chloride  3 mL Intravenous Q12H  . vitamin B-12  1,000 mcg Oral Daily  . vitamin C  1,000 mg Oral Daily   Continuous:  YQM:VHQION chloride, HYDROcodone-acetaminophen, ondansetron (ZOFRAN) IV, ondansetron, sodium chloride  Assessment/Plan:  Principal Problem:   Dyspnea Active Problems:   MDS (myelodysplastic syndrome) with 5q deletion   Anemia requiring transfusions   DM2 (diabetes mellitus, type 2)   Urinary tract infection   L5 vertebral fracture   HTN  (hypertension), benign   Hyperlipidemia   Hypoxia     LOS: 2 days   [name]  [facility] Pager [phone]  08/19/2012, 5:53 PM  If 8PM-8AM, please contact night-coverage at [email or web address], password [facility]

## 2012-08-19 NOTE — Progress Notes (Addendum)
Pt has had 10 junctiona beats from 2030 to 2330 usually followed by a pause.  The longest pause so far has been 1.7 seconds.  Then he returns to his normal rate of 50 - 80 bpms.  During the minute he has the pause, of course, his heart rate is lower = usually in the 30's.  It does not sustain.  Pt has been asymptomatic and vs during this shift.  Just finished his 3rd unit of blood at 0030.  His next tropini will be at 0230.  K. Schorr is on call and has been notified of all of this and I am awaiting a return phone call.  Tina CN also notified and has been assessing strips with me as well.  Will continue to monitor.  Strips for this time period printed and placed on chart.

## 2012-08-19 NOTE — Progress Notes (Signed)
Have sent second message to Sckorr - on call- awaiting reply - pt remains asymptomatic - have shown Tina CN all strips and explained situation, acknowledges all information and also does not see a reason to call up the chain of command as central monitoring is implying.  Will give Sckorr a little more time to respond and continue to monitor pt.

## 2012-08-19 NOTE — Progress Notes (Signed)
  Echocardiogram 2D Echocardiogram has been performed.  Cathie Beams 08/19/2012, 10:19 AM

## 2012-08-19 NOTE — Progress Notes (Signed)
Dr Louie Boston calls back after reviewing more of pts information.  And states it is okay to remain on tele floor, notifiy only if pauses are greater than 4.0 seconds, or  becomes asymptomatic.  Place pt on bedrest.  Central monitoring notified

## 2012-08-19 NOTE — Telephone Encounter (Signed)
Called to report patient is inpatient at [facility] [phone] and will not be at appointment tomorrow. Dr. Truett Perna notified. Requested she contact us when he is discharged.

## 2012-08-19 NOTE — Progress Notes (Signed)
SUBJECTIVE:  Reports cough.  OBJECTIVE:   Vitals:   Filed Vitals:   08/18/12 2345 08/19/12 0025 08/19/12 0546 08/19/12 1034  BP: 122/57 130/58 122/60 124/51  Pulse: 72 80 72 72  Temp: 99.7 F (37.6 C) 99.7 F (37.6 C) 98.4 F (36.9 C) 98.7 F (37.1 C)  TempSrc: Oral Oral Oral Oral  Resp: 18 20 19 20   Height:      Weight:   75.3 kg (166 lb 0.1 oz)   SpO2:   98% 97%   I&O's:   Intake/Output Summary (Last 24 hours) at 08/19/12 1325 Last data filed at 08/19/12 1300  Gross per 24 hour  Intake   1310 ml  Output    800 ml  Net    510 ml   TELEMETRY: Reviewed telemetry pt in sinus bradycardia, normal sinus rhythm, sinus pauses with junctional escape.  No pauses greater than 3 seconds:     PHYSICAL EXAM General: Well developed, well nourished, in no acute distress Head:  Normal cephalic and atramatic  Lungs:  Rhonchi bilaterally Heart:  HRRR S1 S2  Abdomen: abdomen soft and non-tender Msk:   Normal strength and tone for age. Extremities:   No  edema.   Neuro: Alert and oriented Psych:  Good affect, responds appropriately   LABS: Basic Metabolic Panel:  Recent Labs  16/10/96 0820 08/19/12 0210  NA 143 138  K 4.2 4.4  CL 106 102  CO2 26 27  GLUCOSE 107* 84  BUN 24* 29*  CREATININE 1.01 1.11  CALCIUM 8.6 8.2*   Liver Function Tests: No results found for this basename: AST, ALT, ALKPHOS, BILITOT, PROT, ALBUMIN,  in the last 72 hours No results found for this basename: LIPASE, AMYLASE,  in the last 72 hours CBC:  Recent Labs  08/17/12 1815 08/18/12 0820 08/19/12 0210  WBC 4.9 6.8 5.1  NEUTROABS 2.3  --   --   HGB 5.1* 7.3* 7.5*  HCT 15.1* 20.8* 21.9*  MCV 93.2 89.7 89.8  PLT 128* 116* 100*   Cardiac Enzymes:  Recent Labs  08/18/12 1749 08/19/12 0210 08/19/12 0815  TROPONINI 1.06* 0.97* 0.96*   BNP: No components found with this basename: POCBNP,  D-Dimer: No results found for this basename: DDIMER,  in the last 72 hours Hemoglobin  A1C:  Recent Labs  08/18/12 0820  HGBA1C 5.9*   Fasting Lipid Panel: No results found for this basename: CHOL, HDL, LDLCALC, TRIG, CHOLHDL, LDLDIRECT,  in the last 72 hours Thyroid Function Tests: No results found for this basename: TSH, T4TOTAL, FREET3, T3FREE, THYROIDAB,  in the last 72 hours Anemia Panel: No results found for this basename: VITAMINB12, FOLATE, FERRITIN, TIBC, IRON, RETICCTPCT,  in the last 72 hours Coag Panel:   Lab Results  Component Value Date   INR 1.03 07/31/2012   INR 1.03 04/16/2012   INR 1.04 07/31/2011    RADIOLOGY: Dg Chest Port 1 View  08/18/2012  *RADIOLOGY REPORT*  Clinical Data: Shortness of breath.  History of hypertension  PORTABLE CHEST - 1 VIEW  Comparison: None.  Findings: Low lung volumes are identified.  Heart size is upper limits of normal.  Mild aortic ectasia with thoracic aortic calcification is noted.  Low lung volumes are present.  An area of focal density is identified at the left lung base with question small left pleural effusion and septal interstitial lines.  This could represent an area of focal pneumonia however if the patient lays predominately with the left side down this  could represent an area of focal alveolar edema.  No stigmata suggestive of interstitial edema is seen elsewhere in the lungs which are relatively clear with exception of some focal volume loss at the right lung base.  Bony structures demonstrate age appropriate osteopenia.  IMPRESSION: Left lower lobe density could represent either pneumonia atelectasis or positional edema.   Original Report Authenticated By: Rhodia Albright, M.D.    Ir Vertebroplasty Or Sacroplasty  08/05/2012  *RADIOLOGY REPORT*  Clinical Data:  Painful compression fracture at L5.  VERTEBROPLASTY AT L5  Comparison: MRI of the lumbosacral spine of 05/29/2012.  Following a full explanation of the procedure along with the potential associated complications, an informed witnessed consent was obtained.  The  patient was placed prone on the fluoroscopic table.  The skin overlying the lumbosacral region was then prepped in the usual sterile fashion.  The skin entry site overlying the right pedicle at L5 was then infiltrated with 0.25% bupivacaine and extended into the right pedicle.  Using biplane intermittent fluoroscopy, a 13-gauge Cook spine needle was then advanced into the junction of the anterior and middle thirds of the L5 vertebral body.  A gentle contrast injection demonstrated a trabecular pattern of contrast enhancement.  Methylmethacrylate mixture was reconstituted with Tobramycin in the Stryker delivery device system.  Using biplane intermittent fluoroscopy via microtubing connected to the [facility] spine needle, methylmethacrylate mixture was injected at L5 with the Stryker delivery device system.  Excellent filling was obtained in the AP and lateral projections.  Crossing of the midline was achieved. There was no extrusion noted into disc spaces or posteriorly into the spinal canal.  No paraspinous venous contamination was seen.  The needle was then removed.  Hemostasis was achieved at the skin entry site.  The patient tolerated the procedure well.  There were no acute complications.  Medication utilized: Versed 1 mg IV.  Fentanyl 25 mcg IV.  IMPRESSION 1.  Status post fluoroscopic-guided vertebral body augmentation for painful compression fracture at L5 using the vertebroplasty technique.   Original Report Authenticated By: Julieanne Cotton, M.D.       ASSESSMENT: Non-STEMI, likely due to demand ischemia from severe anemia.  No chest pain reported.  PLAN:   Feels better after transfusion.  Echocardiogram pending.  Will have to be careful with IV fluids if LVEF is decreased.  Sinus bradycardia noted as well.  Would avoid any rate slowing agents.  No symptoms with the pauses or junctional escape beats.  If he has any lightheadedness or near syncope, would have to consider pacemaker.  This is all likely  due to age-related sick sinus syndrome.  Continue to follow on telemetry.  Corky Crafts., MD  08/19/2012  1:25 PM

## 2012-08-19 NOTE — Progress Notes (Signed)
K. Sckorr responds - states no further orders needed at this time.  Continue to monitor pt.  If pauses get above 3.0 seconds or pt becomes symptomatic notify her.

## 2012-08-19 NOTE — Progress Notes (Signed)
During the last hour pt has had 2 pauses 2.5 seconds in length and the frequency of the pauses less than 2 seconds have increased.  At times the pauses come 3 in a row with a qrs in between. Pt then returns to his hr of 50 -80's and remains asymptomatic.  HGB just returned and is now 7.5 (was 7.2) and last troponi is .97 (was 1.10 and then 1.06).  PT is NOT on a Beta blocker.  I have discussed this in detail with both K. Schorr from [facility] and Dr. Louie Boston in Cardiology.  Neither Md wants new orders.   Both state nothing else to be done at this time.  I have had frequent discussions throughout this shift with Central Monitoring  as they have been very concerned about this and expressing this with frequent messages and calls.   I will continue to monitor pt and notify MD if pt becomes asymptomatic as they have both asked me to do.

## 2012-08-19 NOTE — Progress Notes (Signed)
PT Cancellation Note  Patient Details Name: Aaron Bruce MRN: [id number] DOB: 1925/06/06   Cancelled Treatment:    Reason Eval/Treat Not Completed: Medical issues which prohibited therapy Pt on bedrest due to heart rhythm this morning and RN reports cardiologist to see pt.   Jonique Kulig,KATHrine E 08/19/2012, 1:32 PM Pager: [phone]

## 2012-08-19 NOTE — Progress Notes (Signed)
OT Cancellation Note  Patient Details Name: Ashon Rosenberg MRN: [id number] DOB: 1925/01/20   Cancelled Treatment:    Reason Eval/Treat Not Completed: Patient not medically ready (Pt on bedrest)  Mammie Meras A OTR/L [phone] 08/19/2012, 10:15 AM

## 2012-08-19 NOTE — Progress Notes (Signed)
P had a 2.11 second pause at 0244, a 2.0 second pause at 0250, remains asymptomatic,  From 0309 to  0316 pt had 2 episodes of 3 pauses in a row  all less than 2 seconds separated by one qrs each.  Pt remains asymptomatic.  HGB returns now at 7.5 (was 7.2)  troponi returns at 0.97 (was 1.10 and then 1.06).  Reported all of this to K. [facility] geologist.  I will notifiy cardiology

## 2012-08-19 NOTE — Progress Notes (Signed)
PT. IS HAVING FREQUENT PAUSES, ALL LESS THAN 3.0,  THEN  HR DROPS TO THE 30'S NON-SUSTAIN PT. IS ASYMPTOMATIC, VS ARE STABLE NOTIFIED CARDIOLOGY DR. Eldridge Dace STATED NOT TO CALL UNLESS PT. PAUSE IS 4.0 OR GREATER OR PT. BECOMES SYMPTOMATIC.

## 2012-08-19 NOTE — Progress Notes (Signed)
Central telemetry notifies me that this pt has has 2 pauses over 2.0 seconds.  The first one at 0104 which was 2.49 seconds and 0204 which was 2.5 seconds.  Pt remains asymptomtic and returns to SB - SR - will continue to monitor pt.

## 2012-08-20 ENCOUNTER — Inpatient Hospital Stay (HOSPITAL_COMMUNITY): Payer: Medicare Other

## 2012-08-20 ENCOUNTER — Other Ambulatory Visit: Payer: Medicare Other | Admitting: Lab

## 2012-08-20 ENCOUNTER — Ambulatory Visit: Payer: Medicare Other | Admitting: Nurse Practitioner

## 2012-08-20 DIAGNOSIS — D46C Myelodysplastic syndrome with isolated del(5q) chromosomal abnormality: Secondary | ICD-10-CM

## 2012-08-20 DIAGNOSIS — I495 Sick sinus syndrome: Secondary | ICD-10-CM | POA: Diagnosis present

## 2012-08-20 LAB — BASIC METABOLIC PANEL
CO2: 26 mEq/L (ref 19–32)
Calcium: 8.4 mg/dL (ref 8.4–10.5)
Chloride: 107 mEq/L (ref 96–112)
Glucose, Bld: 100 mg/dL — ABNORMAL HIGH (ref 70–99)
Potassium: 4 mEq/L (ref 3.5–5.1)
Sodium: 141 mEq/L (ref 135–145)

## 2012-08-20 LAB — CBC
Hemoglobin: 7.5 g/dL — ABNORMAL LOW (ref 13.0–17.0)
MCV: 90.5 fL (ref 78.0–100.0)
Platelets: 98 10*3/uL — ABNORMAL LOW (ref 150–400)
RBC: 2.42 MIL/uL — ABNORMAL LOW (ref 4.22–5.81)
WBC: 4.7 10*3/uL (ref 4.0–10.5)

## 2012-08-20 LAB — GLUCOSE, CAPILLARY
Glucose-Capillary: 101 mg/dL — ABNORMAL HIGH (ref 70–99)
Glucose-Capillary: 140 mg/dL — ABNORMAL HIGH (ref 70–99)
Glucose-Capillary: 116 mg/dL — ABNORMAL HIGH (ref 70–99)

## 2012-08-20 MED ORDER — LEVALBUTEROL HCL 0.63 MG/3ML IN NEBU
0.6300 mg | INHALATION_SOLUTION | Freq: Four times a day (QID) | RESPIRATORY_TRACT | Status: DC | PRN
  Filled 2012-08-20: qty 3

## 2012-08-20 MED ORDER — FUROSEMIDE 10 MG/ML IJ SOLN
40.0000 mg | Freq: Three times a day (TID) | INTRAMUSCULAR | Status: AC
  Administered 2012-08-20 – 2012-08-21 (×2): 40 mg via INTRAVENOUS
  Filled 2012-08-20 (×2): qty 4

## 2012-08-20 NOTE — Clinical Documentation Improvement (Signed)
CHANGE MENTAL STATUS DOCUMENTATION CLARIFICATION   THIS DOCUMENT IS NOT A PERMANENT PART OF THE MEDICAL RECORD  TO RESPOND TO THE THIS QUERY, FOLLOW THE INSTRUCTIONS BELOW:  1. If needed, update documentation for the patient's encounter via the notes activity.  2. Access this query again and click edit on the In [employer].  3. After updating, or not, click F2 to complete all highlighted (required) fields concerning your review. Select "additional documentation in the medical record" OR "no additional documentation provided".  4. Click Sign note button.  5. The deficiency will fall out of your In Basket *Please let us know if you are not able to complete this workflow by phone or e-mail (listed below).         08/20/12  Dear Dr. Suanne Marker, Aaron Bruce Aaron Bruce  In an effort to better capture your patient's severity of illness, reflect appropriate length of stay and utilization of resources, a review of the patient medical record has revealed the following indicators.    Based on your clinical judgment, please clarify and document in a progress note and/or discharge summary the clinical condition associated with the following supporting information:  In responding to this query please exercise your independent judgment.  The fact that a query is asked, does not imply that any particular answer is desired or expected.  In this pt admitted with MDS, NSTEMI, a review of the medical record reveals the following:   Pt with AMS per HP  Becoming more combative per HP  Confusion noted per ED note   Clarification Needed   Please clarify the underlying diagnosis for the AMS/confusion and document in pn or d/c summary    Possible Clinical Conditions?  _______Encephalopathy (describe type if known)                       Anoxic                       Septic                       Alcoholic                        Hepatic                       Hypertensive                       Metabolic                  _______ Toxic  _______Acute confusion _______Acute delirium  _______Other Condition__________________ _______Cannot Clinically Determine   Supporting Information:  Risk Factors: MDS, anemia requiring blood transfusions, DM 2, UTI, bradycardia  Signs & Symptoms: Combative,   Diagnostics:   CXR 08/20/12 IMPRESSION: 1.  Overall aeration has worsened in the lungs bilaterally, with the appearance most suggestive of worsening congestive heart failure, as discussed above.  CXR 08/19/12  IMPRESSION: Left lower lobe density could represent either pneumonia atelectasis or positional edema.   Treatment: Keflex   Reviewed: additional documentation in the medical record  Thank You,  Enis Slipper  RN, BSN, MSN/Inf, CCDS Clinical Documentation Specialist [facility] Dept Pager: [phone] / E-mail: [email or web address]  Health Information Management [place]

## 2012-08-20 NOTE — Progress Notes (Signed)
Patient Name: Aaron Bruce Date of Encounter: 08/20/2012    SUBJECTIVE: Asleep and difficult to fully awaken. C/O dyspnea.  TELEMETRY:  NSR, with pauses < 3 seconds. Filed Vitals:   08/19/12 1400 08/19/12 2217 08/20/12 0031 08/20/12 0540  BP: 135/49 131/55  132/55  Pulse: 75 78  79  Temp: 98.6 F (37 C) 98.6 F (37 C)  98.7 F (37.1 C)  TempSrc: Oral Oral  Oral  Resp: 20 18  18   Height:      Weight:    75.4 kg (166 lb 3.6 oz)  SpO2: 97% 98% 97% 98%    Intake/Output Summary (Last 24 hours) at 08/20/12 0833 Last data filed at 08/20/12 0540  Gross per 24 hour  Intake    480 ml  Output   3550 ml  Net  -3070 ml    LABS: Basic Metabolic Panel:  Recent Labs  16/10/96 0210 08/20/12 0433  NA 138 141  K 4.4 4.0  CL 102 107  CO2 27 26  GLUCOSE 84 100*  BUN 29* 27*  CREATININE 1.11 1.04  CALCIUM 8.2* 8.4   CBC:  Recent Labs  08/17/12 1815  08/19/12 0210 08/20/12 0433  WBC 4.9  < > 5.1 4.7  NEUTROABS 2.3  --   --   --   HGB 5.1*  < > 7.5* 7.5*  HCT 15.1*  < > 21.9* 21.9*  MCV 93.2  < > 89.8 90.5  PLT 128*  < > 100* 98*  < > = values in this interval not displayed. Cardiac Enzymes:  Recent Labs  08/18/12 1749 08/19/12 0210 08/19/12 0815  TROPONINI 1.06* 0.97* 0.96*   Hemoglobin A1C:  Recent Labs  08/18/12 0820  HGBA1C 5.9*   Fasting Lipid Panel: No results found for this basename: CHOL, HDL, LDLCALC, TRIG, CHOLHDL, LDLDIRECT,  in the last 72 hours  Radiology/Studies:  No new  ECHO: ------------------------------------------------------------ LV EF: 55% - 60%  ------------------------------------------------------------ Indications: Previous study 10/08/2009. MI - acute 410.91.  ------------------------------------------------------------ History: PMH: Elevated Troponin. Anemia. L5 vertebral fracture. Hypoxia. Dyspnea. Risk factors: Hypertension. Diabetes mellitus.  Dyslipidemia.  ------------------------------------------------------------ Study Conclusions  - Left ventricle: The cavity size was normal. Systolic function was normal. The estimated ejection fraction was in the range of 55% to 60%. Although no diagnostic regional wall motion abnormality was identified, this possibility cannot be completely excluded on the basis of this study. - Mitral valve: Calcified annulus. Mildly thickened leaflets . Mild regurgitation. - Left atrium: The atrium was mildly dilated.   Physical Exam: Blood pressure 132/55, pulse 79, temperature 98.7 F (37.1 C), temperature source Oral, resp. rate 18, height 5\' 6"  (1.676 m), weight 75.4 kg (166 lb 3.6 oz), SpO2 98.00%. Weight change: 0.1 kg (3.5 oz)   No murmur or gallop. Lethargic  ASSESSMENT:  1. NSTEMI, type 2 2. Sinus node dysfunction, asymptomatic 3. Dyspnea   Plan:  1. Check BNP 2. No specific therapy for SSS unless symptos or pauses > 3 sec.  Selinda Eon 08/20/2012, 8:33 AM

## 2012-08-20 NOTE — Progress Notes (Signed)
Clinical Social Work Department BRIEF PSYCHOSOCIAL ASSESSMENT 08/20/2012  Patient:  Aaron Bruce, Aaron Bruce     Account Number:  [id number]     Admit date:  08/17/2012  Clinical Social Worker:  Orpah Greek  Date/Time:  08/20/2012 02:55 PM  Referred by:  Physician  Date Referred:  08/20/2012 Referred for  SNF Placement   Other Referral:   Interview type:  Patient Other interview type:    PSYCHOSOCIAL DATA Living Status:  ALONE Admitted from facility:   Level of care:   Primary support name:  Claudie Fisherman (wife) currently @ [facility] Primary support relationship to patient:  SPOUSE Degree of support available:   fair    CURRENT CONCERNS Current Concerns  Post-Acute Placement   Other Concerns:    SOCIAL WORK ASSESSMENT / PLAN CSW received referral for discharge planning. Patient states that he's been living alone since his wife, Aaron Bruce was sent to [facility] for Rehab a month or so ago.   Assessment/plan status:  Information/Referral to [facility] Other assessment/ plan:   Information/referral to community resources:   CSW completed FL2 in anticipation of possible SNF placement. Awaiting PT evaluation.    PATIENT'S/FAMILY'S RESPONSE TO PLAN OF CARE: Patient states that he is unsure whether he feels he can return home alone at discharge - son lives in [place] & daughter lives in [place]. Wife is in [facility] for short-term rehab. Awaiting PT evaluation for recommendation.        [name], LCSW [facility] Clinical Social Worker cell #: [phone]

## 2012-08-20 NOTE — Progress Notes (Signed)
PT Cancellation Note  Patient Details Name: Bernadette Armijo MRN: [id number] DOB: 23-Jan-1925   Cancelled Treatment:    Reason Eval/Treat Not Completed: Medical issues which prohibited therapy Pt remains on bedrest order.  Will need updated activity level to perform PT evaluation.   Coen Miyasato,KATHrine E 08/20/2012, 12:40 PM Pager: [phone]

## 2012-08-20 NOTE — Progress Notes (Signed)
OT Cancellation Note  Patient Details Name: Aaron Bruce MRN: [id number] DOB: 1925-04-12   Cancelled Treatment:    Reason Eval/Treat Not Completed: Other (comment) (bedrest) Please update activity as appropriate. Thanks,   Alba Cory 08/20/2012, 9:10 AM

## 2012-08-20 NOTE — Progress Notes (Signed)
[facility] PROGRESS NOTE  Aaron Bruce XBJ:[id number] DOB: Oct 19, 1924 DOA: 08/17/2012  PCP: Aaron Sites, MD  Brief HPI: Aaron Bruce is an 79 y.o. male with hx of MDS with 5q deletion, CVA, DM, HTN, thrombocytopenia due to Revlimid, brought in by family originally because he was becoming more combative. In the ER, however, he was cooperative and was not confused. Evaluation in the ER showed Hb of 5.1grams per DL, and a UA with 3-6 wbcs. His renal fx tests were unremarkable and telemetry 128K. His BS was 220. Hospitalist was asked to admit patient for anemia requiring transfusion and possible UTI.   Assessment/Plan:  Dyspnea with hypoxia/Probable acute diastolic CHF Could be fluid overload due to transfusion.  -BNP elelvated, echo today 2/19 with EF 55-60%, No diagnostic wall motion abnl -CXR 2/18 with  positional edema vs PNA -less likely PNA as pt is afebrile wwith no leuckocytosis  -recheck xray this am 2/20 with worsening infiltrates c/w CHF, will futher diurese today and follow- SOB improving clinically with diuresis. MDS with severe Anemia Chronic anemia. Requires transfusions periodically. Was transfused 2 units on 2/18 and Hgb improved. -hgb7.5 stable follow   NSTEMI with frequent Pauses -Per cards 2/2 demand ischemia due to severe anemia -echo as above with EF 55-60%, No diagnostic wall motion abnl -appreciate cards assistance -per cards no specific therapy/pacer to be considered only if >3sec pauses DM2 Hypoglycemia is resolved. Holding his medications. HBA1c is 5.9.  Abnormal UA/UTI Urine cultures only with multiple bacterial morphotypes. On Keflex- d/c after last dose today  L5 Fracture S/P kyphoplasty recently  History of HTN Stable. Monitor BP closely. Acute Encephalopathy - likely to hypoxia, NSTEMI - Clinically improved with treatment as above  Code Status: Full Code DVT Prophylaxis: SCD Family Communication: Daughter at bedside   Disposition Plan: pending PT EVAL   Consultants: cardiology- Dr Eldridge Dace  Procedures: Blood Transfusion  Antibiotics: Keflex>>2/20  Subjective:  states breathing better this afternoon and that the breathing treatments help   Objective: Vital Signs  Filed Vitals:   08/19/12 2217 08/20/12 0031 08/20/12 0540 08/20/12 1409  BP: 131/55  132/55 129/78  Pulse: 78  79 80  Temp: 98.6 F (37 C)  98.7 F (37.1 C) 99.9 F (37.7 C)  TempSrc: Oral  Oral Oral  Resp: 18  18 20   Height:      Weight:   75.4 kg (166 lb 3.6 oz)   SpO2: 98% 97% 98% 93%    Intake/Output Summary (Last 24 hours) at 08/20/12 1617 Last data filed at 08/20/12 1411  Gross per 24 hour  Intake    240 ml  Output   5150 ml  Net  -4910 ml   Filed Weights   08/17/12 2255 08/19/12 0546 08/20/12 0540  Weight: 65.318 kg (144 lb) 75.3 kg (166 lb 0.1 oz) 75.4 kg (166 lb 3.6 oz)    Intake/Output from previous day: 02/19 0701 - 02/20 0700 In: 480 [P.O.:480] Out: 3550 [Urine:3550]  General appearance: alert, cooperative, appears stated age and no distress Head: Normocephalic, without obvious abnormality, atraumatic Back: symmetric, no curvature. ROM normal. No CVA tenderness. Resp: decreased BS at bases, No wheezing. Cardio: regular rate and rhythm, S1, S2 normal, no murmur, click, rub or gallop GI: soft, non-tender; bowel sounds normal; no masses,  no organomegaly Extremities: no edema Neurologic: Alert. Oriented to person, place and time. No focal deficits.  Lab Results:  Basic Metabolic Panel:  Recent Labs Lab 08/17/12 1815 08/18/12 0820 08/19/12  0210 08/20/12 0433  NA 142 143 138 141  K 4.6 4.2 4.4 4.0  CL 106 106 102 107  CO2 26 26 27 26   GLUCOSE 220* 107* 84 100*  BUN 30* 24* 29* 27*  CREATININE 1.09 1.01 1.11 1.04  CALCIUM 8.5 8.6 8.2* 8.4   CBC:  Recent Labs Lab 08/17/12 1815 08/18/12 0820 08/19/12 0210 08/20/12 0433  WBC 4.9 6.8 5.1 4.7  NEUTROABS 2.3  --   --   --   HGB 5.1*  7.3* 7.5* 7.5*  HCT 15.1* 20.8* 21.9* 21.9*  MCV 93.2 89.7 89.8 90.5  PLT 128* 116* 100* 98*   CBG:  Recent Labs Lab 08/19/12 1121 08/19/12 1641 08/19/12 2215 08/20/12 0741 08/20/12 1206  GLUCAP 115* 88 118* 86 101*     Studies/Results: Dg Chest Port 1 View  08/20/2012  *RADIOLOGY REPORT*  Clinical Data: Follow-up evaluation of lung infiltrates.  PORTABLE CHEST - 1 VIEW  Comparison: Chest x-ray 08/18/2012.  Findings: Extensive bibasilar opacities may reflect areas of atelectasis and/or consolidation, with superimposed small to moderate bilateral pleural effusions. There is cephalization of the pulmonary vasculature, indistinctness of the interstitial markings, and patchy airspace disease throughout the lungs bilaterally suggestive of moderate pulmonary edema.  Mild cardiomegaly.  The patient is rotated to the right on today's exam, resulting in distortion of the mediastinal contours and reduced diagnostic sensitivity and specificity for mediastinal pathology. Atherosclerosis in the thoracic aorta.  IMPRESSION: 1.  Overall aeration has worsened in the lungs bilaterally, with the appearance most suggestive of worsening congestive heart failure, as discussed above.   Original Report Authenticated By: Trudie Reed, M.D.     Medications:  Scheduled: . amLODipine  10 mg Oral Daily  . aspirin  325 mg Oral Daily  . bisacodyl  5 mg Oral QHS  . cephALEXin  500 mg Oral Q8H  . dextromethorphan-guaiFENesin  1 tablet Oral BID  . furosemide  40 mg Intravenous Once  . insulin aspart  0-9 Units Subcutaneous TID WC  . irbesartan  150 mg Oral Daily  . lenalidomide  5 mg Oral Daily  . mirabegron ER  50 mg Oral Daily  . sodium chloride  3 mL Intravenous Q12H  . vitamin B-12  1,000 mcg Oral Daily  . vitamin C  1,000 mg Oral Daily   Continuous:  ZOX:WRUEAV chloride, HYDROcodone-acetaminophen, levalbuterol, ondansetron (ZOFRAN) IV, ondansetron, sodium chloride  Assessment/Plan:  Principal  Problem:   Dyspnea Active Problems:   MDS (myelodysplastic syndrome) with 5q deletion   Anemia requiring transfusions   DM2 (diabetes mellitus, type 2)   Urinary tract infection   L5 vertebral fracture   HTN (hypertension), benign   Hyperlipidemia   Hypoxia   Sinus node dysfunction     LOS: 3 days   [name]  [facility] Pager [phone]  08/20/2012, 4:17 PM  If 8PM-8AM, please contact night-coverage at [email or web address], password [facility]

## 2012-08-21 DIAGNOSIS — I5031 Acute diastolic (congestive) heart failure: Secondary | ICD-10-CM

## 2012-08-21 DIAGNOSIS — I509 Heart failure, unspecified: Secondary | ICD-10-CM

## 2012-08-21 LAB — TYPE AND SCREEN
Unit division: 0
Unit division: 0

## 2012-08-21 LAB — BASIC METABOLIC PANEL
CO2: 28 mEq/L (ref 19–32)
Chloride: 100 mEq/L (ref 96–112)
Creatinine, Ser: 1.18 mg/dL (ref 0.50–1.35)
GFR calc Af Amer: 63 mL/min — ABNORMAL LOW (ref >90)
Potassium: 3.7 mEq/L (ref 3.5–5.1)
Sodium: 139 mEq/L (ref 135–145)

## 2012-08-21 LAB — GLUCOSE, CAPILLARY
Glucose-Capillary: 104 mg/dL — ABNORMAL HIGH (ref 70–99)
Glucose-Capillary: 167 mg/dL — ABNORMAL HIGH (ref 70–99)
Glucose-Capillary: 107 mg/dL — ABNORMAL HIGH (ref 70–99)
Glucose-Capillary: 131 mg/dL — ABNORMAL HIGH (ref 70–99)

## 2012-08-21 NOTE — Progress Notes (Signed)
OT/PT  Cancellation Note  Patient Details Name: Trampas Stettner MRN: [id number] DOB: Jul 18, 1924   Cancelled Treatment:    Reason Eval/Treat Not Completed: Patient not medically ready. Noted pt still on bedrest. OT/PT to sign off. Please re-order when pt is medically appropriate.  Bill Mcvey A OTR/L [phone] 08/21/2012, 8:07 AM

## 2012-08-21 NOTE — Progress Notes (Signed)
[facility] PROGRESS NOTE  Aaron Bruce WUJ:[id number] DOB: 12-11-1924 DOA: 08/17/2012  PCP: Michiel Sites, MD  Brief HPI: Aaron Bruce is an 79 y.o. male with hx of MDS with 5q deletion, CVA, DM, HTN, thrombocytopenia due to Revlimid, brought in by family originally because he was becoming more combative. In the ER, however, he was cooperative and was not confused. Evaluation in the ER showed Hb of 5.1grams per DL, and a UA with 3-6 wbcs. His renal fx tests were unremarkable and telemetry 128K. His BS was 220. Hospitalist was asked to admit patient for anemia requiring transfusion and possible UTI.   Assessment/Plan:  Dyspnea with hypoxia/Acute CHF, high output -2/2 to severe anemia on admission.  -BNP elelvated, echo today 2/19 with EF 55-60%, No diagnostic wall motion abnl -CXR 2/18 with  positional edema vs PNA -less likely PNA as pt is afebrile wwith no leuckocytosis  -recheck xray on 2/20 with worsening infiltrates c/w CHF, will futher diurese today and follow-  -pt clinically much improved today with duresis, continue prn lasix MDS with severe Anemia Chronic anemia. Requires transfusions periodically. Was transfused 2 units on 2/18 and Hgb improved. -hgb7.5 stable follow   NSTEMI and SSS -Per cards 2/2 demand ischemia due to severe anemia -echo as above with EF 55-60%, No diagnostic wall motion abnl -appreciate cards assistance -per cards no specific therapy/pacer to be considered only if >3sec pauses DM2 Hypoglycemia is resolved. Holding his medications. HBA1c is 5.9.  Abnormal UA/UTI Urine cultures only with multiple bacterial morphotypes. On Keflex- d/c after last dose today  L5 Fracture S/P kyphoplasty recently  History of HTN Stable. Monitor BP closely. Acute Encephalopathy - likely to hypoxia, NSTEMI - Clinically improved with treatment as above  Code Status: Full Code DVT Prophylaxis: SCD Family Communication: Daughter at bedside   Disposition Plan: pending PT EVAL   Consultants: cardiology- Dr Eldridge Dace  Procedures: Blood Transfusion  Antibiotics: Keflex>>2/20  Subjective: Pt states he feels better today, states breathing - denies CP. Alert and conversant. Per nsg HR down to 20s today- pt was asleep at the time. Daughter at bedside  Objective: Vital Signs  Filed Vitals:   08/20/12 0540 08/20/12 1409 08/20/12 2232 08/21/12 0519  BP: 132/55 129/78 128/62 131/52  Pulse: 79 80 77 80  Temp: 98.7 F (37.1 C) 99.9 F (37.7 C) 98.3 F (36.8 C) 98.1 F (36.7 C)  TempSrc: Oral Oral Oral Oral  Resp: 18 20 20    Height:      Weight: 75.4 kg (166 lb 3.6 oz)   68.5 kg (151 lb 0.2 oz)  SpO2: 98% 93% 95% 92%    Intake/Output Summary (Last 24 hours) at 08/21/12 1109 Last data filed at 08/21/12 0525  Gross per 24 hour  Intake    483 ml  Output   4625 ml  Net  -4142 ml   Filed Weights   08/19/12 0546 08/20/12 0540 08/21/12 0519  Weight: 75.3 kg (166 lb 0.1 oz) 75.4 kg (166 lb 3.6 oz) 68.5 kg (151 lb 0.2 oz)    Intake/Output from previous day: 02/20 0701 - 02/21 0700 In: 483 [P.O.:480; I.V.:3] Out: 4625 [Urine:4625]  General appearance: alert, cooperative, appears stated age and no distress Head: Normocephalic, without obvious abnormality, atraumatic Back: symmetric, no curvature. ROM normal. No CVA tenderness. Resp: clear, No wheezing. Cardio: regular rate and rhythm, S1, S2 normal, no murmur, click, rub or gallop GI: soft, non-tender; bowel sounds normal; no masses,  no organomegaly Extremities: no  edema Neurologic: Alert. Oriented to person, place and time. No focal deficits.  Lab Results:  Basic Metabolic Panel:  Recent Labs Lab 08/17/12 1815 08/18/12 0820 08/19/12 0210 08/20/12 0433 08/21/12 0432  NA 142 143 138 141 139  K 4.6 4.2 4.4 4.0 3.7  CL 106 106 102 107 100  CO2 26 26 27 26 28   GLUCOSE 220* 107* 84 100* 104*  BUN 30* 24* 29* 27* 31*  CREATININE 1.09 1.01 1.11 1.04 1.18   CALCIUM 8.5 8.6 8.2* 8.4 8.4   CBC:  Recent Labs Lab 08/17/12 1815 08/18/12 0820 08/19/12 0210 08/20/12 0433  WBC 4.9 6.8 5.1 4.7  NEUTROABS 2.3  --   --   --   HGB 5.1* 7.3* 7.5* 7.5*  HCT 15.1* 20.8* 21.9* 21.9*  MCV 93.2 89.7 89.8 90.5  PLT 128* 116* 100* 98*   CBG:  Recent Labs Lab 08/20/12 0741 08/20/12 1206 08/20/12 1640 08/20/12 2226 08/21/12 0755  GLUCAP 86 101* 140* 116* 104*     Studies/Results: Dg Chest Port 1 View  08/20/2012  *RADIOLOGY REPORT*  Clinical Data: Follow-up evaluation of lung infiltrates.  PORTABLE CHEST - 1 VIEW  Comparison: Chest x-ray 08/18/2012.  Findings: Extensive bibasilar opacities may reflect areas of atelectasis and/or consolidation, with superimposed small to moderate bilateral pleural effusions. There is cephalization of the pulmonary vasculature, indistinctness of the interstitial markings, and patchy airspace disease throughout the lungs bilaterally suggestive of moderate pulmonary edema.  Mild cardiomegaly.  The patient is rotated to the right on today's exam, resulting in distortion of the mediastinal contours and reduced diagnostic sensitivity and specificity for mediastinal pathology. Atherosclerosis in the thoracic aorta.  IMPRESSION: 1.  Overall aeration has worsened in the lungs bilaterally, with the appearance most suggestive of worsening congestive heart failure, as discussed above.   Original Report Authenticated By: Trudie Reed, M.D.     Medications:  Scheduled: . amLODipine  10 mg Oral Daily  . aspirin  325 mg Oral Daily  . bisacodyl  5 mg Oral QHS  . dextromethorphan-guaiFENesin  1 tablet Oral BID  . insulin aspart  0-9 Units Subcutaneous TID WC  . irbesartan  150 mg Oral Daily  . lenalidomide  5 mg Oral Daily  . mirabegron ER  50 mg Oral Daily  . sodium chloride  3 mL Intravenous Q12H  . vitamin B-12  1,000 mcg Oral Daily  . vitamin C  1,000 mg Oral Daily   Continuous:  WUJ:WJXBJY chloride,  HYDROcodone-acetaminophen, levalbuterol, ondansetron (ZOFRAN) IV, ondansetron, sodium chloride  Assessment/Plan:  Principal Problem:   Dyspnea Active Problems:   MDS (myelodysplastic syndrome) with 5q deletion   Anemia requiring transfusions   DM2 (diabetes mellitus, type 2)   Urinary tract infection   L5 vertebral fracture   HTN (hypertension), benign   Hyperlipidemia   Hypoxia   Sinus node dysfunction   Acute diastolic CHF (congestive heart failure)     LOS: 4 days   Rima Blizzard C  [facility] Pager [phone]  08/21/2012, 11:09 AM  If 8PM-8AM, please contact night-coverage at [email or web address], password [facility]

## 2012-08-21 NOTE — Progress Notes (Signed)
SUBJECTIVE:  Sleeping and very difficult to arouse  OBJECTIVE:   Vitals:   Filed Vitals:   08/20/12 0540 08/20/12 1409 08/20/12 2232 08/21/12 0519  BP: 132/55 129/78 128/62 131/52  Pulse: 79 80 77 80  Temp: 98.7 F (37.1 C) 99.9 F (37.7 C) 98.3 F (36.8 C) 98.1 F (36.7 C)  TempSrc: Oral Oral Oral Oral  Resp: 18 20 20    Height:      Weight: 75.4 kg (166 lb 3.6 oz)   68.5 kg (151 lb 0.2 oz)  SpO2: 98% 93% 95% 92%   I&O's:   Intake/Output Summary (Last 24 hours) at 08/21/12 1610 Last data filed at 08/21/12 9604  Gross per 24 hour  Intake    483 ml  Output   4625 ml  Net  -4142 ml   TELEMETRY: Reviewed telemetry pt in NSR:     PHYSICAL EXAM General: Well developed, well nourished, in no acute distress Head: Eyes PERRLA, No xanthomas.   Normal cephalic and atramatic  Lungs:   Clear bilaterally to auscultation and percussion. Heart:   HRRR S1 S2 Pulses are 2+ & equal. Abdomen: Bowel sounds are positive, abdomen soft and non-tender without masses  Extremities:   No clubbing, cyanosis or edema.  DP +1 Neuro: Alert and oriented X 3. Psych:  Good affect, responds appropriately   LABS: Basic Metabolic Panel:  Recent Labs  54/09/81 0433 08/21/12 0432  NA 141 139  K 4.0 3.7  CL 107 100  CO2 26 28  GLUCOSE 100* 104*  BUN 27* 31*  CREATININE 1.04 1.18  CALCIUM 8.4 8.4   Liver Function Tests: No results found for this basename: AST, ALT, ALKPHOS, BILITOT, PROT, ALBUMIN,  in the last 72 hours No results found for this basename: LIPASE, AMYLASE,  in the last 72 hours CBC:  Recent Labs  08/19/12 0210 08/20/12 0433  WBC 5.1 4.7  HGB 7.5* 7.5*  HCT 21.9* 21.9*  MCV 89.8 90.5  PLT 100* 98*   Cardiac Enzymes:  Recent Labs  08/18/12 1749 08/19/12 0210 08/19/12 0815  TROPONINI 1.06* 0.97* 0.96*   Coag Panel:   Lab Results  Component Value Date   INR 1.03 07/31/2012   INR 1.03 04/16/2012   INR 1.04 07/31/2011    RADIOLOGY: Dg Chest Port 1  View  08/20/2012  *RADIOLOGY REPORT*  Clinical Data: Follow-up evaluation of lung infiltrates.  PORTABLE CHEST - 1 VIEW  Comparison: Chest x-ray 08/18/2012.  Findings: Extensive bibasilar opacities may reflect areas of atelectasis and/or consolidation, with superimposed small to moderate bilateral pleural effusions. There is cephalization of the pulmonary vasculature, indistinctness of the interstitial markings, and patchy airspace disease throughout the lungs bilaterally suggestive of moderate pulmonary edema.  Mild cardiomegaly.  The patient is rotated to the right on today's exam, resulting in distortion of the mediastinal contours and reduced diagnostic sensitivity and specificity for mediastinal pathology. Atherosclerosis in the thoracic aorta.  IMPRESSION: 1.  Overall aeration has worsened in the lungs bilaterally, with the appearance most suggestive of worsening congestive heart failure, as discussed above.   Original Report Authenticated By: Trudie Reed, M.D.    Dg Chest Port 1 View  08/18/2012  *RADIOLOGY REPORT*  Clinical Data: Shortness of breath.  History of hypertension  PORTABLE CHEST - 1 VIEW  Comparison: None.  Findings: Low lung volumes are identified.  Heart size is upper limits of normal.  Mild aortic ectasia with thoracic aortic calcification is noted.  Low lung volumes are present.  An  area of focal density is identified at the left lung base with question small left pleural effusion and septal interstitial lines.  This could represent an area of focal pneumonia however if the patient lays predominately with the left side down this could represent an area of focal alveolar edema.  No stigmata suggestive of interstitial edema is seen elsewhere in the lungs which are relatively clear with exception of some focal volume loss at the right lung base.  Bony structures demonstrate age appropriate osteopenia.  IMPRESSION: Left lower lobe density could represent either pneumonia atelectasis or  positional edema.   Original Report Authenticated By: Rhodia Albright, M.D.    Ir Vertebroplasty Or Sacroplasty  08/05/2012  *RADIOLOGY REPORT*  Clinical Data:  Painful compression fracture at L5.  VERTEBROPLASTY AT L5  Comparison: MRI of the lumbosacral spine of 05/29/2012.  Following a full explanation of the procedure along with the potential associated complications, an informed witnessed consent was obtained.  The patient was placed prone on the fluoroscopic table.  The skin overlying the lumbosacral region was then prepped in the usual sterile fashion.  The skin entry site overlying the right pedicle at L5 was then infiltrated with 0.25% bupivacaine and extended into the right pedicle.  Using biplane intermittent fluoroscopy, a 13-gauge Cook spine needle was then advanced into the junction of the anterior and middle thirds of the L5 vertebral body.  A gentle contrast injection demonstrated a trabecular pattern of contrast enhancement.  Methylmethacrylate mixture was reconstituted with Tobramycin in the Stryker delivery device system.  Using biplane intermittent fluoroscopy via microtubing connected to the [facility] spine needle, methylmethacrylate mixture was injected at L5 with the Stryker delivery device system.  Excellent filling was obtained in the AP and lateral projections.  Crossing of the midline was achieved. There was no extrusion noted into disc spaces or posteriorly into the spinal canal.  No paraspinous venous contamination was seen.  The needle was then removed.  Hemostasis was achieved at the skin entry site.  The patient tolerated the procedure well.  There were no acute complications.  Medication utilized: Versed 1 mg IV.  Fentanyl 25 mcg IV.  IMPRESSION 1.  Status post fluoroscopic-guided vertebral body augmentation for painful compression fracture at L5 using the vertebroplasty technique.   Original Report Authenticated By: Julieanne Cotton, M.D.     ASSESSMENT:  1. NSTEMI, type 2  2.  Sinus node dysfunction, asymptomatic  3. Dyspnea secondary to acute diastolic CHF from high output failure.  EF normal on echo- he has net negative 6L although it appears that weights are erratic (initial weight 68kg then increased to 75kg and now back to 68kg)  Plan:  1. Continue diuretics as needed. 2. No specific therapy for SSS unless symptos or pauses > 3 sec.   Quintella Reichert, MD  08/21/2012  8:33 AM

## 2012-08-21 NOTE — Progress Notes (Signed)
Pt HR continues to drop into 20's Armanda Magic PA notified nd strips faxed to office, notified that norvasc 1000 dose was held. Instructed to just watch pt.

## 2012-08-21 NOTE — Progress Notes (Signed)
See OT note  Thanks,  Harriet Butte, PT [phone]

## 2012-08-22 DIAGNOSIS — I495 Sick sinus syndrome: Secondary | ICD-10-CM

## 2012-08-22 LAB — BASIC METABOLIC PANEL
BUN: 43 mg/dL — ABNORMAL HIGH (ref 6–23)
Calcium: 8.7 mg/dL (ref 8.4–10.5)
Chloride: 98 mEq/L (ref 96–112)
Creatinine, Ser: 1.34 mg/dL (ref 0.50–1.35)
GFR calc Af Amer: 54 mL/min — ABNORMAL LOW (ref >90)
GFR calc non Af Amer: 47 mL/min — ABNORMAL LOW (ref >90)

## 2012-08-22 LAB — CBC
HCT: 24.7 % — ABNORMAL LOW (ref 39.0–52.0)
MCHC: 34 g/dL (ref 30.0–36.0)
Platelets: DECREASED 10*3/uL (ref 150–400)
RDW: 16.4 % — ABNORMAL HIGH (ref 11.5–15.5)
WBC: 3.6 10*3/uL — ABNORMAL LOW (ref 4.0–10.5)

## 2012-08-22 LAB — GLUCOSE, CAPILLARY
Glucose-Capillary: 108 mg/dL — ABNORMAL HIGH (ref 70–99)
Glucose-Capillary: 156 mg/dL — ABNORMAL HIGH (ref 70–99)
Glucose-Capillary: 174 mg/dL — ABNORMAL HIGH (ref 70–99)
Glucose-Capillary: 136 mg/dL — ABNORMAL HIGH (ref 70–99)

## 2012-08-22 NOTE — Progress Notes (Signed)
[facility] PROGRESS NOTE  Aaron Bruce ZOX:[id number] DOB: 1924-10-31 DOA: 08/17/2012  PCP: Michiel Sites, MD  Brief HPI: Aaron Bruce is an 79 y.o. male with hx of MDS with 5q deletion, CVA, DM, HTN, thrombocytopenia due to Revlimid, brought in by family originally because he was becoming more combative. In the ER, however, he was cooperative and was not confused. Evaluation in the ER showed Hb of 5.1grams per DL, and a UA with 3-6 wbcs. His renal fx tests were unremarkable and telemetry 128K. His BS was 220. Hospitalist was asked to admit patient for anemia requiring transfusion and possible UTI.   Assessment/Plan:  Dyspnea with hypoxia/Acute CHF, high output -2/2 to severe anemia on admission.  -BNP elelvated, echo today 2/19 with EF 55-60%, No diagnostic wall motion abnl -CXR 2/18 with  positional edema vs PNA -less likely PNA as pt is afebrile wwith no leuckocytosis  -recheck xray on 2/20 with worsening infiltrates c/w CHF, will futher diurese today and follow-  -pt continues to improve clinically, no further lasix today>>continue to follow and diurese as needed MDS with severe Anemia Chronic anemia. Requires transfusions periodically. Was transfused 2 units on 2/18 and Hgb improved. -hgb 8.4 today, stable follow    NSTEMI and SSS -Per cards 2/2 demand ischemia due to severe anemia -echo as above with EF 55-60%, No diagnostic wall motion abnl -appreciate cards assistance-signed off today 2/22 -per cards no specific therapy/pacer to be considered only if >3sec pauses DM2 Hypoglycemia is resolved. Holding his medications. HBA1c is 5.9.  Abnormal UA/UTI Urine cultures only with multiple bacterial morphotypes. On Keflex- d/c after last dose today  L5 Fracture S/P kyphoplasty recently  History of HTN Stable. Monitor BP closely. Acute Encephalopathy - likely to hypoxia, NSTEMI - Clinically improved with treatment as above  Code Status: Full Code DVT  Prophylaxis: SCD Family Communication: Daughter at bedside  Disposition Plan: to SNF   Consultants: cardiology- Dr Eldridge Dace  Procedures: Blood Transfusion  Antibiotics: Keflex>>2/20  Subjective: Denies SOB, denies CP. Alert and orientedx3. Voices that he will like to go to the same rehab that his wife is in.   Objective: Vital Signs  Filed Vitals:   08/21/12 2119 08/22/12 0545 08/22/12 0552 08/22/12 1300  BP:  112/47 115/48 106/49  Pulse: 70 71  63  Temp: 98.3 F (36.8 C) 98.2 F (36.8 C)  98.4 F (36.9 C)  TempSrc: Oral Oral  Oral  Resp: 19 18  18   Height:      Weight:      SpO2: 99% 99%  96%    Intake/Output Summary (Last 24 hours) at 08/22/12 1527 Last data filed at 08/22/12 1515  Gross per 24 hour  Intake    840 ml  Output   1250 ml  Net   -410 ml   Filed Weights   08/19/12 0546 08/20/12 0540 08/21/12 0519  Weight: 75.3 kg (166 lb 0.1 oz) 75.4 kg (166 lb 3.6 oz) 68.5 kg (151 lb 0.2 oz)    Intake/Output from previous day: 02/21 0701 - 02/22 0700 In: 840 [P.O.:840] Out: 1200 [Urine:1200]  General appearance: alert, cooperative, appears stated age and no distress Head: Normocephalic, without obvious abnormality, atraumatic Back: symmetric, no curvature. ROM normal. No CVA tenderness. Resp: clear, No wheezes. Cardio: regular rate and rhythm, S1, S2 normal, no murmur, click, rub or gallop GI: soft, non-tender; bowel sounds normal; no masses,  no organomegaly Extremities: no edema Neurologic: Alert. Oriented to person, place and time.  No focal deficits.  Lab Results:  Basic Metabolic Panel:  Recent Labs Lab 08/18/12 0820 08/19/12 0210 08/20/12 0433 08/21/12 0432 08/22/12 0523  NA 143 138 141 139 135  K 4.2 4.4 4.0 3.7 4.0  CL 106 102 107 100 98  CO2 26 27 26 28 27   GLUCOSE 107* 84 100* 104* 113*  BUN 24* 29* 27* 31* 43*  CREATININE 1.01 1.11 1.04 1.18 1.34  CALCIUM 8.6 8.2* 8.4 8.4 8.7   CBC:  Recent Labs Lab 08/17/12 1815  08/18/12 0820 08/19/12 0210 08/20/12 0433 08/22/12 0523  WBC 4.9 6.8 5.1 4.7 3.6*  NEUTROABS 2.3  --   --   --   --   HGB 5.1* 7.3* 7.5* 7.5* 8.4*  HCT 15.1* 20.8* 21.9* 21.9* 24.7*  MCV 93.2 89.7 89.8 90.5 89.5  PLT 128* 116* 100* 98* PLATELET CLUMPS NOTED ON SMEAR, COUNT APPEARS DECREASED   CBG:  Recent Labs Lab 08/21/12 1119 08/21/12 1659 08/21/12 2140 08/22/12 0730 08/22/12 1137  GLUCAP 167* 107* 131* 108* 156*     Studies/Results: No results found.  Medications:  Scheduled: . aspirin  325 mg Oral Daily  . bisacodyl  5 mg Oral QHS  . dextromethorphan-guaiFENesin  1 tablet Oral BID  . insulin aspart  0-9 Units Subcutaneous TID WC  . irbesartan  150 mg Oral Daily  . lenalidomide  5 mg Oral Daily  . mirabegron ER  50 mg Oral Daily  . sodium chloride  3 mL Intravenous Q12H  . vitamin B-12  1,000 mcg Oral Daily  . vitamin C  1,000 mg Oral Daily   Continuous:  ZOX:WRUEAV chloride, HYDROcodone-acetaminophen, levalbuterol, ondansetron (ZOFRAN) IV, ondansetron, sodium chloride  Assessment/Plan:  Principal Problem:   Dyspnea Active Problems:   MDS (myelodysplastic syndrome) with 5q deletion   Anemia requiring transfusions   DM2 (diabetes mellitus, type 2)   Urinary tract infection   L5 vertebral fracture   HTN (hypertension), benign   Hyperlipidemia   Hypoxia   Sinus node dysfunction   Acute diastolic CHF (congestive heart failure)     LOS: 5 days   [name]  [facility] Pager [phone]  08/22/2012, 3:27 PM  If 8PM-8AM, please contact night-coverage at [email or web address], password [facility]

## 2012-08-22 NOTE — Progress Notes (Signed)
Patient is having frequent pauses, all <3.0 seconds, then heart rate will drop to mid 20's- low 30's, non-sustained,  VSS. Cardiology aware - per note by Dr. Eldridge Dace, no therapy indicated unless patient has pauses that are >3.0 seconds or becomes symptomatic. Will continue to monitor.

## 2012-08-22 NOTE — Progress Notes (Signed)
Patient ID: Aaron Bruce, male   DOB: 1925/04/08, 79 y.o.   MRN: [id number] SUBJECTIVE:  Lethargic no complaints  OBJECTIVE:   Vitals:   Filed Vitals:   08/21/12 1454 08/21/12 2119 08/22/12 0545 08/22/12 0552  BP: 104/35 113/43 112/47 115/48  Pulse: 71 70 71   Temp: 98 F (36.7 C) 98.3 F (36.8 C) 98.2 F (36.8 C)   TempSrc: Oral Oral Oral   Resp: 18 19 18    Height:      Weight:      SpO2: 94% 99% 99%    I&O's:    Intake/Output Summary (Last 24 hours) at 08/22/12 4098 Last data filed at 08/22/12 0325  Gross per 24 hour  Intake    840 ml  Output   1200 ml  Net   -360 ml   TELEMETRY: Reviewed telemetry pt in NSR:     PHYSICAL EXAM General: Well developed, well nourished, in no acute distress Head: Eyes PERRLA, No xanthomas.   Normal cephalic and atramatic  Lungs:   Clear bilaterally to auscultation and percussion. Heart:   HRRR S1 S2 Pulses are 2+ & equal. Abdomen: Bowel sounds are positive, abdomen soft and non-tender without masses  Extremities:   No clubbing, cyanosis or edema.  DP +1 Neuro: Alert and oriented X 3. Psych:  Good affect, responds appropriately   LABS: Basic Metabolic Panel:  Recent Labs  11/91/47 0432 08/22/12 0523  NA 139 135  K 3.7 4.0  CL 100 98  CO2 28 27  GLUCOSE 104* 113*  BUN 31* 43*  CREATININE 1.18 1.34  CALCIUM 8.4 8.7   Liver Function Tests: No results found for this basename: AST, ALT, ALKPHOS, BILITOT, PROT, ALBUMIN,  in the last 72 hours No results found for this basename: LIPASE, AMYLASE,  in the last 72 hours CBC:  Recent Labs  08/20/12 0433 08/22/12 0523  WBC 4.7 3.6*  HGB 7.5* 8.4*  HCT 21.9* 24.7*  MCV 90.5 89.5  PLT 98* PLATELET CLUMPS NOTED ON SMEAR, COUNT APPEARS DECREASED   Cardiac Enzymes:  Recent Labs  08/19/12 0815  TROPONINI 0.96*   Coag Panel:   Lab Results  Component Value Date   INR 1.03 07/31/2012   INR 1.03 04/16/2012   INR 1.04 07/31/2011    RADIOLOGY: Dg Chest Port 1  View  08/20/2012  *RADIOLOGY REPORT*  Clinical Data: Follow-up evaluation of lung infiltrates.  PORTABLE CHEST - 1 VIEW  Comparison: Chest x-ray 08/18/2012.  Findings: Extensive bibasilar opacities may reflect areas of atelectasis and/or consolidation, with superimposed small to moderate bilateral pleural effusions. There is cephalization of the pulmonary vasculature, indistinctness of the interstitial markings, and patchy airspace disease throughout the lungs bilaterally suggestive of moderate pulmonary edema.  Mild cardiomegaly.  The patient is rotated to the right on today's exam, resulting in distortion of the mediastinal contours and reduced diagnostic sensitivity and specificity for mediastinal pathology. Atherosclerosis in the thoracic aorta.  IMPRESSION: 1.  Overall aeration has worsened in the lungs bilaterally, with the appearance most suggestive of worsening congestive heart failure, as discussed above.   Original Report Authenticated By: Trudie Reed, M.D.    Dg Chest Port 1 View  08/18/2012  *RADIOLOGY REPORT*  Clinical Data: Shortness of breath.  History of hypertension  PORTABLE CHEST - 1 VIEW  Comparison: None.  Findings: Low lung volumes are identified.  Heart size is upper limits of normal.  Mild aortic ectasia with thoracic aortic calcification is noted.  Low lung volumes  are present.  An area of focal density is identified at the left lung base with question small left pleural effusion and septal interstitial lines.  This could represent an area of focal pneumonia however if the patient lays predominately with the left side down this could represent an area of focal alveolar edema.  No stigmata suggestive of interstitial edema is seen elsewhere in the lungs which are relatively clear with exception of some focal volume loss at the right lung base.  Bony structures demonstrate age appropriate osteopenia.  IMPRESSION: Left lower lobe density could represent either pneumonia atelectasis or  positional edema.   Original Report Authenticated By: Rhodia Albright, M.D.    Ir Vertebroplasty Or Sacroplasty  08/05/2012  *RADIOLOGY REPORT*  Clinical Data:  Painful compression fracture at L5.  VERTEBROPLASTY AT L5  Comparison: MRI of the lumbosacral spine of 05/29/2012.  Following a full explanation of the procedure along with the potential associated complications, an informed witnessed consent was obtained.  The patient was placed prone on the fluoroscopic table.  The skin overlying the lumbosacral region was then prepped in the usual sterile fashion.  The skin entry site overlying the right pedicle at L5 was then infiltrated with 0.25% bupivacaine and extended into the right pedicle.  Using biplane intermittent fluoroscopy, a 13-gauge Cook spine needle was then advanced into the junction of the anterior and middle thirds of the L5 vertebral body.  A gentle contrast injection demonstrated a trabecular pattern of contrast enhancement.  Methylmethacrylate mixture was reconstituted with Tobramycin in the Stryker delivery device system.  Using biplane intermittent fluoroscopy via microtubing connected to the [facility] spine needle, methylmethacrylate mixture was injected at L5 with the Stryker delivery device system.  Excellent filling was obtained in the AP and lateral projections.  Crossing of the midline was achieved. There was no extrusion noted into disc spaces or posteriorly into the spinal canal.  No paraspinous venous contamination was seen.  The needle was then removed.  Hemostasis was achieved at the skin entry site.  The patient tolerated the procedure well.  There were no acute complications.  Medication utilized: Versed 1 mg IV.  Fentanyl 25 mcg IV.  IMPRESSION 1.  Status post fluoroscopic-guided vertebral body augmentation for painful compression fracture at L5 using the vertebroplasty technique.   Original Report Authenticated By: Julieanne Cotton, M.D.     ASSESSMENT:  1. NSTEMI, type 2  2.  Sinus node dysfunction, asymptomatic  3. Dyspnea secondary to acute diastolic CHF from high output failure.  EF normal on echo- he has net negative 6L although it appears that weights are erratic (initial weight 68kg then increased to 75kg and now back to 68kg)  Plan:  1. Continue diuretics as needed. 2. No specific therapy for SSS unless symptos or pauses > 3 sec.  Will sign off    Charlton Haws, MD  08/22/2012  7:52 AM

## 2012-08-23 LAB — GLUCOSE, CAPILLARY
Glucose-Capillary: 110 mg/dL — ABNORMAL HIGH (ref 70–99)
Glucose-Capillary: 171 mg/dL — ABNORMAL HIGH (ref 70–99)
Glucose-Capillary: 127 mg/dL — ABNORMAL HIGH (ref 70–99)

## 2012-08-23 LAB — PREPARE RBC (CROSSMATCH)

## 2012-08-23 LAB — CBC
MCH: 21.6 pg — ABNORMAL LOW (ref 26.0–34.0)
MCHC: 24.4 g/dL — ABNORMAL LOW (ref 30.0–36.0)
Platelets: DECREASED 10*3/uL (ref 150–400)
RBC: 3.05 MIL/uL — ABNORMAL LOW (ref 4.22–5.81)

## 2012-08-23 MED ORDER — FUROSEMIDE 10 MG/ML IJ SOLN
40.0000 mg | Freq: Once | INTRAMUSCULAR | Status: AC
  Administered 2012-08-24: 40 mg via INTRAVENOUS
  Filled 2012-08-23: qty 4

## 2012-08-23 MED ORDER — FUROSEMIDE 10 MG/ML IJ SOLN
20.0000 mg | Freq: Once | INTRAMUSCULAR | Status: AC
  Administered 2012-08-23: 20 mg via INTRAVENOUS
  Filled 2012-08-23 (×2): qty 2

## 2012-08-23 NOTE — Progress Notes (Signed)
Chart review.  Noted that PT/OT signed off on Pt on 08/21/12 due to Pt being on bed rest.  Weekday CSW to f/u and assist with d/c planning.  CSW to continue to follow.  Providence Crosby, LCSWA Clinical Social Work [phone]

## 2012-08-23 NOTE — Progress Notes (Signed)
CRITICAL VALUE ALERT  Critical value received:  Hgb 6.6  Date of notification:  08/23/12   Time of notification:  0625  Critical value read back:yes  Nurse who received alert:  Joaquin Music, RN  MD notified (1st page):  Elray Mcgregor  Time of first page:  0626  MD notified (2nd page):  Time of second page:  Responding MD:  Elray Mcgregor  Time MD responded:  [phone]

## 2012-08-23 NOTE — Progress Notes (Signed)
[facility] PROGRESS NOTE  Travarus Bruce QIO:[id number] DOB: 1925-02-09 DOA: 08/17/2012  PCP: Michiel Sites, MD  Brief HPI: Aaron Bruce is an 79 y.o. male with hx of MDS with 5q deletion, CVA, DM, HTN, thrombocytopenia due to Revlimid, brought in by family originally because he was becoming more combative. In the ER, however, he was cooperative and was not confused. Evaluation in the ER showed Hb of 5.1grams per DL, and a UA with 3-6 wbcs. His renal fx tests were unremarkable and telemetry 128K. His BS was 220. Hospitalist was asked to admit patient for anemia requiring transfusion and possible UTI.   Assessment/Plan:  Dyspnea with hypoxia/Acute CHF, high output -2/2 to severe anemia on admission.  -BNP elelvated, echo today 2/19 with EF 55-60%, No diagnostic wall motion abnl -CXR 2/18 with  positional edema vs PNA -less likely PNA as pt is afebrile wwith no leuckocytosis  -recheck xray on 2/20 with worsening infiltrates c/w CHF, will futher diurese today and follow-  -pt continues to improve clinically, will give lasix after each unit of blood as he needs to be transfused -see #2 -continue to follow and diurese as needed MDS with severe Anemia Chronic anemia. Requires transfusions periodically. Was transfused 2 units on 2/18 and Hgb improved. -hgb down to 6.6 this am, will transfuse units of blood - lasix after each unit   NSTEMI and SSS -Per cards 2/2 demand ischemia due to severe anemia -echo as above with EF 55-60%, No diagnostic wall motion abnl -appreciate cards assistance-signed off  2/22 -per cards no specific therapy/pacer to be considered only if >3sec pauses DM2 Hypoglycemia is resolved. Holding his medications. HBA1c is 5.9.  Abnormal UA/UTI Urine cultures only with multiple bacterial morphotypes. On Keflex- d/c after last dose today  L5 Fracture S/P kyphoplasty recently  History of HTN Stable. Monitor BP closely. Acute Encephalopathy -  likely to hypoxia, NSTEMI - Clinically improved with treatment as above  Code Status: Full Code DVT Prophylaxis: SCD Family Communication: directly with pt at bedside Disposition Plan: to SNF when bed available   Consultants: cardiology- Dr Eldridge Dace  Procedures: Blood Transfusion  Antibiotics: Keflex>>2/20  Subjective: Denies any c/o, no gross bleeding.  Objective: Vital Signs  Filed Vitals:   08/22/12 1300 08/22/12 2151 08/23/12 0427 08/23/12 0503  BP: 106/49 118/41  119/45  Pulse: 63 65  68  Temp: 98.4 F (36.9 C) 98.6 F (37 C)  98.6 F (37 C)  TempSrc: Oral Oral  Oral  Resp: 18 18  18   Height:      Weight:   68.8 kg (151 lb 10.8 oz)   SpO2: 96% 99%  100%    Intake/Output Summary (Last 24 hours) at 08/23/12 1151 Last data filed at 08/23/12 0600  Gross per 24 hour  Intake 1674.17 ml  Output   1475 ml  Net 199.17 ml   Filed Weights   08/20/12 0540 08/21/12 0519 08/23/12 0427  Weight: 75.4 kg (166 lb 3.6 oz) 68.5 kg (151 lb 0.2 oz) 68.8 kg (151 lb 10.8 oz)    Intake/Output from previous day: 02/22 0701 - 02/23 0700 In: 1914.2 [P.O.:720; I.V.:1194.2] Out: 2000 [Urine:2000]  General appearance: alert, cooperative, appears stated age and no distress Head: Normocephalic, without obvious abnormality, atraumatic Back: symmetric, no curvature. ROM normal. No CVA tenderness. Resp: clear, No wheezes. Cardio: regular rate and rhythm, S1, S2 normal, no murmur, click, rub or gallop GI: soft, non-tender; bowel sounds normal; no masses,  no organomegaly  Extremities: no edema Neurologic: Alert. Oriented to person, place and time. No focal deficits.  Lab Results:  Basic Metabolic Panel:  Recent Labs Lab 08/18/12 0820 08/19/12 0210 08/20/12 0433 08/21/12 0432 08/22/12 0523  NA 143 138 141 139 135  K 4.2 4.4 4.0 3.7 4.0  CL 106 102 107 100 98  CO2 26 27 26 28 27   GLUCOSE 107* 84 100* 104* 113*  BUN 24* 29* 27* 31* 43*  CREATININE 1.01 1.11 1.04 1.18 1.34   CALCIUM 8.6 8.2* 8.4 8.4 8.7   CBC:  Recent Labs Lab 08/17/12 1815 08/18/12 0820 08/19/12 0210 08/20/12 0433 08/22/12 0523 08/23/12 0505  WBC 4.9 6.8 5.1 4.7 3.6* 3.2*  NEUTROABS 2.3  --   --   --   --   --   HGB 5.1* 7.3* 7.5* 7.5* 8.4* 6.6*  HCT 15.1* 20.8* 21.9* 21.9* 24.7* 27.1*  MCV 93.2 89.7 89.8 90.5 89.5 88.9  PLT 128* 116* 100* 98* PLATELET CLUMPS NOTED ON SMEAR, COUNT APPEARS DECREASED PLATELET CLUMPS NOTED ON SMEAR, COUNT APPEARS DECREASED   CBG:  Recent Labs Lab 08/22/12 0730 08/22/12 1137 08/22/12 1645 08/22/12 2149 08/23/12 0745  GLUCAP 108* 156* 174* 136* 135*     Studies/Results: No results found.  Medications:  Scheduled: . aspirin  325 mg Oral Daily  . bisacodyl  5 mg Oral QHS  . dextromethorphan-guaiFENesin  1 tablet Oral BID  . furosemide  20 mg Intravenous Once  . insulin aspart  0-9 Units Subcutaneous TID WC  . irbesartan  150 mg Oral Daily  . lenalidomide  5 mg Oral Daily  . mirabegron ER  50 mg Oral Daily  . sodium chloride  3 mL Intravenous Q12H  . vitamin B-12  1,000 mcg Oral Daily  . vitamin C  1,000 mg Oral Daily   Continuous:  ZOX:WRUEAV chloride, HYDROcodone-acetaminophen, levalbuterol, ondansetron (ZOFRAN) IV, ondansetron, sodium chloride  Assessment/Plan:  Principal Problem:   Dyspnea Active Problems:   MDS (myelodysplastic syndrome) with 5q deletion   Anemia requiring transfusions   DM2 (diabetes mellitus, type 2)   Urinary tract infection   L5 vertebral fracture   HTN (hypertension), benign   Hyperlipidemia   Hypoxia   Sinus node dysfunction   Acute diastolic CHF (congestive heart failure)     LOS: 6 days   [name] C  [facility] Pager [phone]  08/23/2012, 11:51 AM  If 8PM-8AM, please contact night-coverage at [email or web address], password [facility]

## 2012-08-24 ENCOUNTER — Telehealth: Payer: Self-pay | Admitting: *Deleted

## 2012-08-24 DIAGNOSIS — I214 Non-ST elevation (NSTEMI) myocardial infarction: Secondary | ICD-10-CM

## 2012-08-24 LAB — GLUCOSE, CAPILLARY
Glucose-Capillary: 106 mg/dL — ABNORMAL HIGH (ref 70–99)
Glucose-Capillary: 165 mg/dL — ABNORMAL HIGH (ref 70–99)

## 2012-08-24 LAB — TYPE AND SCREEN

## 2012-08-24 LAB — BASIC METABOLIC PANEL
Calcium: 9 mg/dL (ref 8.4–10.5)
GFR calc non Af Amer: 48 mL/min — ABNORMAL LOW (ref >90)
Sodium: 134 mEq/L — ABNORMAL LOW (ref 135–145)

## 2012-08-24 LAB — CBC
MCH: 30.2 pg (ref 26.0–34.0)
Platelets: 99 10*3/uL — ABNORMAL LOW (ref 150–400)
RBC: 3.25 MIL/uL — ABNORMAL LOW (ref 4.22–5.81)
WBC: 4.3 10*3/uL (ref 4.0–10.5)

## 2012-08-24 MED ORDER — INSULIN ASPART 100 UNIT/ML ~~LOC~~ SOLN: 0.0000 [IU] | Freq: Three times a day (TID) | SUBCUTANEOUS | Status: DC

## 2012-08-24 MED ORDER — ASPIRIN 325 MG PO TABS: 325.0000 mg | ORAL_TABLET | Freq: Every day | ORAL | Status: AC

## 2012-08-24 MED ORDER — EZETIMIBE-SIMVASTATIN 10-40 MG PO TABS
1.0000 | ORAL_TABLET | Freq: Every day | ORAL | Status: DC
  Administered 2012-08-24: 1 via ORAL
  Filled 2012-08-24 (×2): qty 1

## 2012-08-24 MED ORDER — FLUOXETINE HCL 10 MG PO CAPS
10.0000 mg | ORAL_CAPSULE | Freq: Every day | ORAL | Status: DC
  Administered 2012-08-25: 10 mg via ORAL
  Filled 2012-08-24 (×2): qty 1

## 2012-08-24 MED ORDER — HYDROCODONE-ACETAMINOPHEN 5-325 MG PO TABS: 1.0000 | ORAL_TABLET | ORAL | Status: AC | PRN

## 2012-08-24 MED ORDER — CYANOCOBALAMIN 1000 MCG PO TABS
1000.0000 ug | ORAL_TABLET | Freq: Every day | ORAL | Status: AC
Start: 2012-08-24 — End: ?

## 2012-08-24 MED ORDER — TERIPARATIDE (RECOMBINANT) 600 MCG/2.4ML ~~LOC~~ SOLN: 20.0000 ug | Freq: Every morning | SUBCUTANEOUS | Status: DC

## 2012-08-24 NOTE — Discharge Summary (Signed)
Physician Discharge Summary  Aaron Bruce NFA:[id number] DOB: October 19, 1924 DOA: 08/17/2012  PCP: Michiel Sites, MD  Admit date: 08/17/2012 Discharge date: 08/24/2012  Time spent: >67minutes  Recommendations for Outpatient Follow-up:      Follow-up Information   Follow up with Quintella Reichert, MD. (in 1-2weeks, call for appt upon discharge)    Contact information:   [address] [employer] [address] [place] [address] [phone]       Please follow up. (SNF MD in 1--2days)       Follow up with Thornton Papas, MD. (in 1-2weeks, call for appt upon discharge)    Contact information:   [address] [place] [address] [phone]       Discharge Diagnoses:  Principal Problem:   Dyspnea Active Problems:   MDS (myelodysplastic syndrome) with 5q deletion   Anemia requiring transfusions   DM2 (diabetes mellitus, type 2)   Urinary tract infection   L5 vertebral fracture   HTN (hypertension), benign   Hyperlipidemia   Hypoxia   Sinus node dysfunction   Acute diastolic CHF (congestive heart failure)   Discharge Condition: Improved/stable  Diet recommendation: Modified carbohydrate  Filed Weights   08/21/12 0519 08/23/12 0427 08/24/12 0641  Weight: 68.5 kg (151 lb 0.2 oz) 68.8 kg (151 lb 10.8 oz) 67.8 kg (149 lb 7.6 oz)    History of present illness:  Aaron Bruce is an 79 y.o. male with hx of MDS with 5q deletion, CVA, DM, HTN, thrombocytopenia due to Revlimid, brought in by family originally because he was becoming more combative. In the ER, however, he was cooperative and was not confused. Evaluation in the ER showed Hb of 5.1grams per DL, and a UA with 3-6 wbcs. His renal fx tests were unremarkable and telemetry 128K. His BS was 220. Hospitalist was asked to admit patient for anemia requiring transfusion and possible UTI.       Hospital Course:  Dyspnea with hypoxia/Acute CHF, high output  - On admission the patient had a chest x-ray done which  showed left lower lobe density pneumonia/atelectasis versus positional edema. BMP was done and was elevated at 6600. The patient was treated with IV Lasix as needed . cardiac enzymes were done and his troponins were elevated . cardiology was consulted and the impression was that patient had had NSTEMI secondary to demand ischemia due severe anemia on admission. With diuresis he improved clinically and his shortness of breath resolved. He had an echocardiogram done and it showed and EF of 55-60% with no diagnostic wall motion abnormalities. Cardiology followed and the impression was that the acute CHF was secondary to high output heart failure due to the CV anemia (please see #2 below for treatment of the anemia), and agreed with diuresis on as needed basis only. Nursing M.D. to continue to monitor patient's fluid status and diuresis and when necessary as clinically appropriate -MDS with severe Anemia  Chronic anemia. Requires transfusions periodically. Was transfused 2 units on 2/18 and Hgb improved.  -hgb down to 6.6 on 2/23 he was transfused another 2 units of packed red blood cells and his hemoglobin improved to 9.8 today. -Discussed patient with Dr. Myrle Sheng today prior to discharge and he states since patient is still getting severely anemic on the Revlimid-it would appear that it is not working and he recommends to discontinue it and have patient followup at the office in one to 2 weeks. NSTEMI and sick sinus syndrome -Per cards 2/2 demand ischemia due  to severe anemia  -echo as above with EF 55-60%, No diagnostic wall motion abnl  -appreciate cards assistance, follow patient and signed off 2/22  -he Was managed medically. -Patient had bradycardia reported to be down to the 20s while asleep and cardiology followed and recommended no specific therapy/pacer to be considered unless >3sec pauses. He is to followup with Dr. Mayford Knife outpatient. DM2  A she had hypoglycemia while in the hospital, his oral  medications were discontinued. A HBA1c was checked and came back at 5.9. Was placed on sliding scale insulin, recommend continued monitoring of his Accu-Cheks at nursing facility with sliding scale coverage, and follow and consider resuming oral hypoglycemics when clinically appropriate. Abnormal UA/UTI  On admission urinalysis is consistent with a UTI and he was empirically started on Keflex .Urine cultures only with multiple bacterial morphotypes. He was treated with 3 days of the Keflex and was discontinued. L5 Fracture  S/P kyphoplasty recently  History of HTN Stable. Monitor BP closely.  Acute Encephalopathy  - likely to hypoxia, NSTEMI  - Clinically improved with treatment as above  Consultants: cardiology- Dr Eldridge Dace  Procedures:  -Blood Transfusion  -ECHO Study Conclusions  - Left ventricle: The cavity size was normal. Systolic function was normal. The estimated ejection fraction was in the range of 55% to 60%. Although no diagnostic regional wall motion abnormality was identified, this possibility cannot be completely excluded on the basis of this study. - Mitral valve: Calcified annulus. Mildly thickened leaflets . Mild regurgitation. - Left atrium: The atrium was mildly dilated. Transthoracic echocardiography   Discharge Exam: Filed Vitals:   08/23/12 2325 08/24/12 0641 08/24/12 1023 08/24/12 1325  BP: 122/44 123/51 121/45 113/47  Pulse: 70 71 66 63  Temp: 98.5 F (36.9 C) 98 F (36.7 C)  97.9 F (36.6 C)  TempSrc: Oral Oral  Oral  Resp: 18 18 18 16   Height:      Weight:  67.8 kg (149 lb 7.6 oz)    SpO2:  100% 98% 99%   General appearance: alert, cooperative, appears stated age and no distress  Head: Normocephalic, without obvious abnormality, atraumatic  Resp: clear, No wheezes.  Cardio: regular rate and rhythm, S1, S2 normal, no murmur, click, rub or gallop  GI: soft, non-tender; bowel sounds normal; no masses, no organomegaly  Extremities: no edema   Neurologic: Alert. Oriented to person, place and time. No focal deficits.     Discharge Instructions  Discharge Orders   Future Orders Complete By Expires     Diet Carb Modified  As directed     Increase activity slowly  As directed         Medication List    STOP taking these medications       metFORMIN 1000 MG tablet  Commonly known as:  GLUCOPHAGE     REVLIMID 5 MG capsule  Generic drug:  lenalidomide      TAKE these medications       amLODipine 10 MG tablet  Commonly known as:  NORVASC  Take 10 mg by mouth daily.     aspirin 325 MG tablet  Take 1 tablet (325 mg total) by mouth daily.     beta carotene w/minerals tablet  Take 1 tablet by mouth daily.     cyanocobalamin 1000 MCG tablet  Take 1 tablet (1,000 mcg total) by mouth daily.     doxycycline 100 MG tablet  Commonly known as:  VIBRA-TABS  Take 100 mg by mouth daily. He is to  take for 30 days for his eyes. His pharmacy filled this for him on 08/05/12.     ezetimibe-simvastatin 10-40 MG per tablet  Commonly known as:  VYTORIN  Take 1 tablet by mouth at bedtime.     FLUoxetine 10 MG capsule  Commonly known as:  PROZAC  Take 10 mg by mouth daily.     FORTEO Hooper  Inject 1 each into the skin daily.     HYDROcodone-acetaminophen 5-325 MG per tablet  Commonly known as:  NORCO/VICODIN  Take 1 tablet by mouth every 4 (four) hours as needed.     insulin aspart 100 UNIT/ML injection  Commonly known as:  novoLOG  Inject 0-9 Units into the skin 3 (three) times daily with meals.     MYRBETRIQ 50 MG Tb24  Generic drug:  mirabegron ER  Take 50 mg by mouth Daily.     telmisartan 40 MG tablet  Commonly known as:  MICARDIS  Take 40 mg by mouth daily.           Follow-up Information   Follow up with Quintella Reichert, MD. (in 1-2weeks, call for appt upon discharge)    Contact information:   [address] [place] [address] [phone]       Please follow up. (SNF MD in 1--2days)        Follow up with Thornton Papas, MD. (in 1-2weeks, call for appt upon discharge)    Contact information:   [address] [place] [address] [phone]        The results of significant diagnostics from this hospitalization (including imaging, microbiology, ancillary and laboratory) are listed below for reference.    Significant Diagnostic Studies: Dg Chest Port 1 View  08/20/2012  *RADIOLOGY REPORT*  Clinical Data: Follow-up evaluation of lung infiltrates.  PORTABLE CHEST - 1 VIEW  Comparison: Chest x-ray 08/18/2012.  Findings: Extensive bibasilar opacities may reflect areas of atelectasis and/or consolidation, with superimposed small to moderate bilateral pleural effusions. There is cephalization of the pulmonary vasculature, indistinctness of the interstitial markings, and patchy airspace disease throughout the lungs bilaterally suggestive of moderate pulmonary edema.  Mild cardiomegaly.  The patient is rotated to the right on today's exam, resulting in distortion of the mediastinal contours and reduced diagnostic sensitivity and specificity for mediastinal pathology. Atherosclerosis in the thoracic aorta.  IMPRESSION: 1.  Overall aeration has worsened in the lungs bilaterally, with the appearance most suggestive of worsening congestive heart failure, as discussed above.   Original Report Authenticated By: Trudie Reed, M.D.    Dg Chest Port 1 View  08/18/2012  *RADIOLOGY REPORT*  Clinical Data: Shortness of breath.  History of hypertension  PORTABLE CHEST - 1 VIEW  Comparison: None.  Findings: Low lung volumes are identified.  Heart size is upper limits of normal.  Mild aortic ectasia with thoracic aortic calcification is noted.  Low lung volumes are present.  An area of focal density is identified at the left lung base with question small left pleural effusion and septal interstitial lines.  This could represent an area of focal pneumonia however if the patient lays predominately  with the left side down this could represent an area of focal alveolar edema.  No stigmata suggestive of interstitial edema is seen elsewhere in the lungs which are relatively clear with exception of some focal volume loss at the right lung base.  Bony structures demonstrate age appropriate osteopenia.  IMPRESSION: Left lower lobe density could represent either pneumonia atelectasis  or positional edema.   Original Report Authenticated By: Rhodia Albright, M.D.    Ir Vertebroplasty Or Sacroplasty  08/05/2012  *RADIOLOGY REPORT*  Clinical Data:  Painful compression fracture at L5.  VERTEBROPLASTY AT L5  Comparison: MRI of the lumbosacral spine of 05/29/2012.  Following a full explanation of the procedure along with the potential associated complications, an informed witnessed consent was obtained.  The patient was placed prone on the fluoroscopic table.  The skin overlying the lumbosacral region was then prepped in the usual sterile fashion.  The skin entry site overlying the right pedicle at L5 was then infiltrated with 0.25% bupivacaine and extended into the right pedicle.  Using biplane intermittent fluoroscopy, a 13-gauge Cook spine needle was then advanced into the junction of the anterior and middle thirds of the L5 vertebral body.  A gentle contrast injection demonstrated a trabecular pattern of contrast enhancement.  Methylmethacrylate mixture was reconstituted with Tobramycin in the Stryker delivery device system.  Using biplane intermittent fluoroscopy via microtubing connected to the [facility] spine needle, methylmethacrylate mixture was injected at L5 with the Stryker delivery device system.  Excellent filling was obtained in the AP and lateral projections.  Crossing of the midline was achieved. There was no extrusion noted into disc spaces or posteriorly into the spinal canal.  No paraspinous venous contamination was seen.  The needle was then removed.  Hemostasis was achieved at the skin entry site.  The  patient tolerated the procedure well.  There were no acute complications.  Medication utilized: Versed 1 mg IV.  Fentanyl 25 mcg IV.  IMPRESSION 1.  Status post fluoroscopic-guided vertebral body augmentation for painful compression fracture at L5 using the vertebroplasty technique.   Original Report Authenticated By: Julieanne Cotton, M.D.     Microbiology: Recent Results (from the past 240 hour(s))  URINE CULTURE     Status: None   Collection Time    08/17/12  6:06 PM      Result Value Range Status   Specimen Description URINE, CLEAN CATCH   Final   Special Requests NONE   Final   Culture  Setup Time 08/18/2012 01:46   Final   Colony Count 70,000 COLONIES/ML   Final   Culture     Final   Value: Multiple bacterial morphotypes present, none predominant. Suggest appropriate recollection if clinically indicated.   Report Status 08/18/2012 FINAL   Final     Labs: Basic Metabolic Panel:  Recent Labs Lab 08/19/12 0210 08/20/12 0433 08/21/12 0432 08/22/12 0523 08/24/12 0144  NA 138 141 139 135 134*  K 4.4 4.0 3.7 4.0 4.2  CL 102 107 100 98 97  CO2 27 26 28 27 26   GLUCOSE 84 100* 104* 113* 146*  BUN 29* 27* 31* 43* 56*  CREATININE 1.11 1.04 1.18 1.34 1.31  CALCIUM 8.2* 8.4 8.4 8.7 9.0   Liver Function Tests: No results found for this basename: AST, ALT, ALKPHOS, BILITOT, PROT, ALBUMIN,  in the last 168 hours No results found for this basename: LIPASE, AMYLASE,  in the last 168 hours No results found for this basename: AMMONIA,  in the last 168 hours CBC:  Recent Labs Lab 08/17/12 1815  08/19/12 0210 08/20/12 0433 08/22/12 0523 08/23/12 0505 08/24/12 0144  WBC 4.9  < > 5.1 4.7 3.6* 3.2* 4.3  NEUTROABS 2.3  --   --   --   --   --   --   HGB 5.1*  < > 7.5* 7.5* 8.4* 6.6* 9.8*  HCT 15.1*  < > 21.9* 21.9* 24.7* 27.1* 28.7*  MCV 93.2  < > 89.8 90.5 89.5 88.9 88.3  PLT 128*  < > 100* 98* PLATELET CLUMPS NOTED ON SMEAR, COUNT APPEARS DECREASED PLATELET CLUMPS NOTED ON SMEAR,  COUNT APPEARS DECREASED 99*  < > = values in this interval not displayed. Cardiac Enzymes:  Recent Labs Lab 08/18/12 1250 08/18/12 1749 08/19/12 0210 08/19/12 0815  TROPONINI 1.10* 1.06* 0.97* 0.96*   BNP: BNP (last 3 results)  Recent Labs  08/18/12 0820 08/20/12 0433  PROBNP 6636.0* 6364.0*   CBG:  Recent Labs Lab 08/23/12 1213 08/23/12 1636 08/23/12 2223 08/24/12 0738 08/24/12 1220  GLUCAP 110* 171* 127* 106* 141*       Signed:  Shanik Brookshire C  [facility] 08/24/2012, 2:28 PM

## 2012-08-24 NOTE — Progress Notes (Signed)
CSW spoke with patient's daughter, Steward Drone (cell#:  [phone] Home#: [phone]) re: SNF bed offers. Patient's daughter was hopeful that patient would be able to go to [facility] where his wife is for rehab. After going back & forth with the facility, they told daughter that they were not able to meet his needs. CSW provided daughter with other bed offers - daughter is going to check out [place] and [address] Will follow-up in the morning re: final SNF decision. CSW sent page to Dr. Suanne Marker to make her aware.   [name], LCSW [facility] Clinical Social Worker cell #: [phone]

## 2012-08-24 NOTE — Telephone Encounter (Signed)
Call from pt's daughter reporting pt is being discharged from [facility] to rehab at [facility]. Rehab facility will not admit him while taking chemo pills. Steward Drone is asking if this is critical that he remain on Revlimid? Per Dr. Truett Perna, pt needs Revlimid. OK to discontinue if needed. Will need to know how long pt needs to be off Revlimid. Returned call to pt's daughter, it is unsure how long pt will need to be in rehab. Administrator at [facility] told her they can not administer outside medication and pt can not self administer his Revlimid. Her mother is also at [facility] recovering from hip surgery so she would like to have him there. She will discuss further with social workers.

## 2012-08-24 NOTE — Evaluation (Signed)
Physical Therapy Evaluation Patient Details Name: Aaron Bruce MRN: [id number] DOB: 19-Jul-1924 Today's Date: 08/24/2012 Time: 4098-1191 PT Time Calculation (min): 20 min  PT Assessment / Plan / Recommendation Clinical Impression   Hospitalist was asked to admit patient for anemia requiring transfusion and possible UTI.  Pt will benefit form PT  due global weakness having been on bedrest for 1 wk now ( since hospital adm). Will need continued PT as well as post acute rehab      PT Assessment  Patient needs continued PT services    Follow Up Recommendations  SNF    Does the patient have the potential to tolerate intense rehabilitation      Barriers to Discharge        Equipment Recommendations  None recommended by PT    Recommendations for Other Services     Frequency Min 3X/week    Precautions / Restrictions Precautions Precautions: Fall   Pertinent Vitals/Pain       Mobility  Bed Mobility Bed Mobility: Supine to Sit Supine to Sit: 1: +2 Total assist Supine to Sit: Patient Percentage: 50% Details for Bed Mobility Assistance: cues for technique, self assist  Transfers Transfers: Sit to Stand;Stand to Sit Sit to Stand: 3: Mod assist;From bed;With upper extremity assist Stand to Sit: 3: Mod assist;To chair/3-in-1;Without upper extremity assist Details for Transfer Assistance: cues for hand placement and safety awareness Ambulation/Gait Ambulation/Gait Assistance: 3: Mod assist Ambulation Distance (Feet): 30 Feet Assistive device: Rolling walker Ambulation/Gait Assistance Details: cues for RW distance from  self, upward gaze Gait Pattern: Step-to pattern;Trunk flexed;Decreased stride length    Exercises     PT Diagnosis: Difficulty walking;Generalized weakness  PT Problem List: Decreased activity tolerance;Decreased balance;Decreased mobility;Decreased safety awareness PT Treatment Interventions: DME instruction;Gait training;Stair training;Functional mobility  training;Therapeutic activities;Therapeutic exercise;Patient/family education;Balance training   PT Goals Acute Rehab PT Goals PT Goal Formulation: With patient Time For Goal Achievement: 08/31/12 Potential to Achieve Goals: Good Pt will go Supine/Side to Sit: with supervision PT Goal: Supine/Side to Sit - Progress: Goal set today Pt will go Sit to Supine/Side: with supervision PT Goal: Sit to Supine/Side - Progress: Goal set today Pt will go Sit to Stand: with supervision PT Goal: Sit to Stand - Progress: Goal set today Pt will Ambulate: 51 - 150 feet;with supervision;with rolling walker PT Goal: Ambulate - Progress: Goal set today Pt will Perform Home Exercise Program: with supervision, verbal cues required/provided PT Goal: Perform Home Exercise Program - Progress: Goal set today  Visit Information  Last PT Received On: 08/24/12 Assistance Needed: +2    Subjective Data  Subjective: they have been telling me not to get OOB Patient Stated Goal: home after rehab   Prior Functioning  Home Living Lives With: Spouse Available Help at Discharge: Skilled Nursing Facility Additional Comments: pt planning to go to SNF at [facility], his wife his in rehab Prior Function Level of Independence: Independent Communication Communication: No difficulties    Cognition  Cognition Overall Cognitive Status: Appears within functional limits for tasks assessed/performed Arousal/Alertness: Awake/alert Orientation Level: Appears intact for tasks assessed Behavior During Session: [facility] for tasks performed    Extremity/Trunk Assessment Right Lower Extremity Assessment RLE ROM/Strength/Tone: Deficits RLE ROM/Strength/Tone Deficits: 3+/5 Left Lower Extremity Assessment LLE ROM/Strength/Tone: Deficits LLE ROM/Strength/Tone Deficits: 3+/5   Balance    End of Session PT - End of Session Equipment Utilized During Treatment: Gait belt Activity Tolerance: Patient tolerated treatment well  GP      [facility] 08/24/2012,  2:50 PM

## 2012-08-25 ENCOUNTER — Telehealth: Payer: Self-pay | Admitting: *Deleted

## 2012-08-25 DIAGNOSIS — D46C Myelodysplastic syndrome with isolated del(5q) chromosomal abnormality: Secondary | ICD-10-CM

## 2012-08-25 LAB — GLUCOSE, CAPILLARY: Glucose-Capillary: 140 mg/dL — ABNORMAL HIGH (ref 70–99)

## 2012-08-25 NOTE — Progress Notes (Signed)
Physical Therapy Treatment Patient Details Name: Aaron Bruce MRN: [id number] DOB: December 26, 1924 Today's Date: 08/25/2012 Time: 0910-0930 PT Time Calculation (min): 20 min  PT Assessment / Plan / Recommendation Comments on Treatment Session  Pt progressing with mobility, however still with min/mod assist for gait and pt demos limited endurance.      Follow Up Recommendations  SNF     Does the patient have the potential to tolerate intense rehabilitation     Barriers to Discharge        Equipment Recommendations  None recommended by PT    Recommendations for Other Services    Frequency Min 3X/week   Plan Discharge plan remains appropriate    Precautions / Restrictions Precautions Precautions: Fall Restrictions Weight Bearing Restrictions: No   Pertinent Vitals/Pain Some pain at site of catheter.     Mobility  Bed Mobility Bed Mobility: Supine to Sit Supine to Sit: 3: Mod assist Details for Bed Mobility Assistance: Assist for trunk with cues for hand placement, technique and safety.  Transfers Transfers: Sit to Stand;Stand to Sit Sit to Stand: 4: Min assist;From bed;From chair/3-in-1;With upper extremity assist Stand to Sit: 4: Min assist;With upper extremity assist;With armrests;To chair/3-in-1 Details for Transfer Assistance: Assist to rise, steady and ensure controlled descent with cues for hand placement and safety.  Ambulation/Gait Ambulation/Gait Assistance: 4: Min assist;3: Mod assist Ambulation Distance (Feet): 38 Feet (x2) Assistive device: Rolling walker Ambulation/Gait Assistance Details: Cues and assist for negotiating and maintaining position inside of RW and also cues for upright posture.  Gait Pattern: Step-to pattern;Trunk flexed;Decreased stride length Gait velocity: decreased    Exercises     PT Diagnosis:    PT Problem List:   PT Treatment Interventions:     PT Goals Acute Rehab PT Goals PT Goal Formulation: With patient Time For Goal  Achievement: 08/31/12 Potential to Achieve Goals: Good Pt will go Supine/Side to Sit: with supervision PT Goal: Supine/Side to Sit - Progress: Progressing toward goal Pt will go Sit to Stand: with supervision PT Goal: Sit to Stand - Progress: Progressing toward goal Pt will Ambulate: 51 - 150 feet;with supervision;with rolling walker PT Goal: Ambulate - Progress: Progressing toward goal  Visit Information  Last PT Received On: 08/25/12 Assistance Needed: +2 (+2 helpful for chair follow)    Subjective Data  Subjective: I'm just tired.  Patient Stated Goal: home after rehab   Cognition  Cognition Overall Cognitive Status: Appears within functional limits for tasks assessed/performed Arousal/Alertness: Awake/alert Orientation Level: Appears intact for tasks assessed Behavior During Session: [facility] for tasks performed    Balance     End of Session PT - End of Session Equipment Utilized During Treatment: Gait belt Activity Tolerance: Patient limited by fatigue Patient left: in chair;with call bell/phone within reach Nurse Communication: Mobility status   GP     Vista Deck 08/25/2012, 11:17 AM

## 2012-08-25 NOTE — Progress Notes (Signed)
Report called to [facility] to annie.

## 2012-08-25 NOTE — Evaluation (Signed)
Occupational Therapy Evaluation Patient Details Name: Aaron Bruce MRN: [id number] DOB: [birth year] Today's Date: 08/25/2012 Time: 4098-1191 OT Time Calculation (min): 16 min  OT Assessment / Plan / Recommendation Clinical Impression  Tis 79 year old man was admitted with dyspnea, anemia and possible uti.  He has decreased activity tolerance and will benefit from skilled OT.  Goals in acute are supervision to min guard A    OT Assessment  Patient needs continued OT Services    Follow Up Recommendations  SNF    Barriers to Discharge      Equipment Recommendations  3 in 1 bedside comode    Recommendations for Other Services    Frequency  Min 2X/week    Precautions / Restrictions Precautions Precautions: Fall   Pertinent Vitals/Pain No c/o pain.  Sats 93% RA   ADL  Grooming: Performed;Brushing hair Where Assessed - Grooming: Supported sitting Upper Body Bathing: Simulated;Set up Where Assessed - Upper Body Bathing: Supported sitting Lower Body Bathing: Simulated;Minimal assistance Where Assessed - Lower Body Bathing: Supported sitting Upper Body Dressing: Simulated;Minimal assistance (lines) Where Assessed - Upper Body Dressing: Supported sitting Lower Body Dressing: Simulated;Minimal assistance Where Assessed - Lower Body Dressing: Supported sit to Pharmacist, hospital: Mining engineer Method: Sit to stand (bed to chair) Toileting - Clothing Manipulation and Hygiene: Simulated;Min guard Where Assessed - Toileting Clothing Manipulation and Hygiene: Sit to stand from 3-in-1 or toilet Transfers/Ambulation Related to ADLs:cotx and  ambulated with PT: +2 for chair/safety as pt fatiques easily ADL Comments: Pt fatiques easily:  encouraged to take rest break and continue activity to build tolerance    OT Diagnosis: Generalized weakness  OT Problem List: Decreased strength;Decreased activity tolerance;Impaired balance (sitting and/or  standing) OT Treatment Interventions: Self-care/ADL training;DME and/or AE instruction;Patient/family education;Balance training;Therapeutic activities;Energy conservation   OT Goals Acute Rehab OT Goals OT Goal Formulation: With patient Time For Goal Achievement: 09/01/12 Potential to Achieve Goals: Good ADL Goals Pt Will Transfer to Toilet: with min assist;Ambulation;3-in-1 (min guard and complete all aspects of toileting with supervision) ADL Goal: Toilet Transfer - Progress: Goal set today Miscellaneous OT Goals Miscellaneous OT Goal #1: Pt will perform bathing and dressing with supervision sit to stand, initiating rest breaks as needed without cues OT Goal: Miscellaneous Goal #1 - Progress: Goal set today Miscellaneous OT Goal #2: pt will verbalize 3 energy conservation techniques OT Goal: Miscellaneous Goal #2 - Progress: Goal set today  Visit Information  Last OT Received On: 08/25/12 Assistance Needed: +2 (chair to follow)    Subjective Data  Subjective: I  could get my socks on Patient Stated Goal: Oceanographer for rehab   Prior Functioning     Home Living Lives With: Spouse Available Help at Discharge: Skilled Nursing Facility Prior Function Level of Independence: Independent Communication Communication: No difficulties         Vision/Perception     Cognition  Cognition Overall Cognitive Status: Appears within functional limits for tasks assessed/performed Arousal/Alertness: Awake/alert Orientation Level: Appears intact for tasks assessed Behavior During Session: [facility] for tasks performed    Extremity/Trunk Assessment Right Upper Extremity Assessment RUE ROM/Strength/Tone: [facility] for tasks assessed (strength grossly 3 to 3+ encouraged AROM for strengthening) Left Upper Extremity Assessment LUE ROM/Strength/Tone: WFL for tasks assessed Trunk Assessment Trunk Assessment: Kyphotic     Mobility Bed Mobility Supine to Sit: 3: Mod assist Transfers Sit to  Stand: 4: Min assist;From bed;From chair/3-in-1;With upper extremity assist Details for Transfer Assistance: cues for hand placement  Exercise     Balance     End of Session OT - End of Session Activity Tolerance: Patient tolerated treatment well Patient left: in chair;with call bell/phone within reach  GO     [facility] 08/25/2012, 10:37 AM Marica Otter, OTR/L [phone] 08/25/2012

## 2012-08-25 NOTE — Progress Notes (Signed)
Patient is set to discharge to [facility] today. Patient & daughter, Steward Drone at bedside aware. [facility] called for 1:00 pickup. Discharge packet in [facility] with AVS, FL2 & chart copy.   Clinical Social Work Department CLINICAL SOCIAL WORK PLACEMENT NOTE 08/25/2012  Patient:  Aaron Bruce, Aaron Bruce  Account Number:  [id number] Admit date:  08/17/2012  Clinical Social Worker:  Orpah Greek  Date/time:  08/25/2012 12:03 PM  Clinical Social Work is seeking post-discharge placement for this patient at the following level of care:   SKILLED NURSING   (*CSW will update this form in Epic as items are completed)   08/22/2012  Patient/family provided with [facility] Department of Clinical Social Work's list of facilities offering this level of care within the geographic area requested by the patient (or if unable, by the patient's family).  08/22/2012  Patient/family informed of their freedom to choose among providers that offer the needed level of care, that participate in Medicare, Medicaid or managed care program needed by the patient, have an available bed and are willing to accept the patient.  08/22/2012  Patient/family informed of [facility]' ownership interest in [facility], as well as of the fact that they are under no obligation to receive care at this facility.  PASARR submitted to EDS on 08/22/2012 PASARR number received from EDS on 08/22/2012  FL2 transmitted to all facilities in geographic area requested by pt/family on  08/22/2012 FL2 transmitted to all facilities within larger geographic area on   Patient informed that his/her managed care company has contracts with or will negotiate with  certain facilities, including the following:     Patient/family informed of bed offers received:  08/24/2012 Patient chooses bed at [facility] Physician recommends and patient chooses bed at    Patient to be transferred to [facility] on  08/25/2012 Patient to be transferred to facility by [facility]  The following physician request were entered in Epic:   Additional Comments:  [name], LCSW [facility] Clinical Social Worker cell #: [phone]

## 2012-08-25 NOTE — Progress Notes (Signed)
Patient discharged to nursing facility on 2/24 but [facility] nursing facility declined patient and so he had to stay overnight for placement at a different facility today her social worker. Today he denies any complaints, he remains afebrile and vital signs stable. On exam is alert and oriented x3 his lungs are clear to auscultation bilaterally cardiovascular regular rate and rhythm. She remains medically stable for discharge to nursing facility today. Please see the full discharge summary of 2/24 for details of this hospital stay. Donnalee Curry [facility] [phone]

## 2012-08-25 NOTE — Telephone Encounter (Signed)
Message from [place] at [place] requesting 2 week appt for pt to be seen in office. POF to schedulers.

## 2012-08-26 ENCOUNTER — Encounter: Payer: Self-pay | Admitting: Oncology

## 2012-08-26 ENCOUNTER — Telehealth: Payer: Self-pay | Admitting: Oncology

## 2012-08-26 NOTE — Telephone Encounter (Signed)
lvm for RN supervisor @ [phone] advised her the msg will also need to be givin to transportation ...regarding to 3.11.14 appt

## 2012-08-26 NOTE — Progress Notes (Signed)
Called [facility] to verify drug approved 07/20/12-07/19/13. I spoke with Milicent and she said Revlimid. I will send to Medical records for filing.

## 2012-09-08 ENCOUNTER — Ambulatory Visit (HOSPITAL_BASED_OUTPATIENT_CLINIC_OR_DEPARTMENT_OTHER): Payer: Medicare Other

## 2012-09-08 ENCOUNTER — Ambulatory Visit (HOSPITAL_BASED_OUTPATIENT_CLINIC_OR_DEPARTMENT_OTHER): Payer: Medicare Other | Admitting: Nurse Practitioner

## 2012-09-08 ENCOUNTER — Telehealth: Payer: Self-pay | Admitting: Oncology

## 2012-09-08 ENCOUNTER — Other Ambulatory Visit: Payer: Self-pay | Admitting: Nurse Practitioner

## 2012-09-08 ENCOUNTER — Encounter (HOSPITAL_COMMUNITY)
Admission: RE | Admit: 2012-09-08 | Discharge: 2012-09-08 | Disposition: A | Discharge status: Home / Self Care | Payer: Medicare Other | Source: Ambulatory Visit | Attending: Oncology | Admitting: Oncology

## 2012-09-08 ENCOUNTER — Other Ambulatory Visit (HOSPITAL_BASED_OUTPATIENT_CLINIC_OR_DEPARTMENT_OTHER): Payer: Medicare Other | Admitting: Lab

## 2012-09-08 ENCOUNTER — Encounter (HOSPITAL_COMMUNITY)
Admission: RE | Admit: 2012-09-08 | Discharge: 2012-09-08 | Disposition: A | Discharge status: Home / Self Care | Payer: Medicare Other | Source: Ambulatory Visit | Attending: Endocrinology | Admitting: Endocrinology

## 2012-09-08 VITALS — BP 147/40 | HR 56 | Temp 98.6°F | Resp 18 | Wt 174.4 lb

## 2012-09-08 VITALS — BP 174/59 | HR 58 | Temp 98.2°F | Resp 16

## 2012-09-08 DIAGNOSIS — D46C Myelodysplastic syndrome with isolated del(5q) chromosomal abnormality: Secondary | ICD-10-CM

## 2012-09-08 DIAGNOSIS — D696 Thrombocytopenia, unspecified: Secondary | ICD-10-CM

## 2012-09-08 DIAGNOSIS — D649 Anemia, unspecified: Secondary | ICD-10-CM

## 2012-09-08 DIAGNOSIS — D709 Neutropenia, unspecified: Secondary | ICD-10-CM

## 2012-09-08 LAB — CBC WITH DIFFERENTIAL/PLATELET
Basophils Absolute: 0.3 10*3/uL — ABNORMAL HIGH (ref 0.0–0.1)
EOS%: 4.9 % (ref 0.0–7.0)
Eosinophils Absolute: 0.3 10*3/uL (ref 0.0–0.5)
HGB: 6.7 g/dL — CL (ref 13.0–17.1)
LYMPH%: 28.3 % (ref 14.0–49.0)
MCH: 29.4 pg (ref 27.2–33.4)
MCV: 90.8 fL (ref 79.3–98.0)
MONO%: 8.7 % (ref 0.0–14.0)
NEUT#: 2.8 10*3/uL (ref 1.5–6.5)
Platelets: 149 10*3/uL (ref 140–400)
RBC: 2.28 10*6/uL — ABNORMAL LOW (ref 4.20–5.82)
RDW: 15.9 % — ABNORMAL HIGH (ref 11.0–14.6)

## 2012-09-08 LAB — HOLD TUBE, BLOOD BANK

## 2012-09-08 MED ORDER — SODIUM CHLORIDE 0.9 % IV SOLN
250.0000 mL | Freq: Once | INTRAVENOUS | Status: AC
  Administered 2012-09-08: 250 mL via INTRAVENOUS

## 2012-09-08 MED ORDER — FUROSEMIDE 10 MG/ML IJ SOLN
40.0000 mg | Freq: Once | INTRAMUSCULAR | Status: AC
  Administered 2012-09-08: 40 mg via INTRAVENOUS

## 2012-09-08 MED ORDER — EPOETIN ALFA 20000 UNIT/ML IJ SOLN
60000.0000 [IU] | Freq: Once | INTRAMUSCULAR | Status: AC
  Administered 2012-09-08: 60000 [IU] via SUBCUTANEOUS
  Filled 2012-09-08: qty 3

## 2012-09-08 NOTE — Progress Notes (Signed)
OFFICE PROGRESS NOTE  Interval history:  [name] was hospitalized 08/17/2012 through 08/24/2012 presenting with dyspnea. He was found to have acute CHF and was diuresed. He was severely anemic and required transfusion support. Revlimid was discontinued during hospitalization.  He is residing at a nursing facility. He is fatigued. He has dyspnea on exertion. He denies bleeding. He has gained a significant amount of weight over the past several weeks.   Objective: Blood pressure 147/40, pulse 56, temperature 98.6 F (37 C), temperature source Oral, resp. rate 18, weight 174 lb 6.4 oz (79.107 kg).  Oropharynx is without thrush or ulceration. Lungs are clear. Regular cardiac rhythm. Abdomen soft and nontender. Pitting edema below the knees bilaterally.  Lab Results: Lab Results  Component Value Date   WBC 5.3 09/08/2012   HGB 6.7* 09/08/2012   HCT 20.7* 09/08/2012   MCV 90.8 09/08/2012   PLT 149 09/08/2012    Chemistry:    Chemistry      Component Value Date/Time   NA 134* 08/24/2012 0144   NA 142 07/28/2012 1507   K 4.2 08/24/2012 0144   K 4.0 07/28/2012 1507   CL 97 08/24/2012 0144   CL 105 07/28/2012 1507   CO2 26 08/24/2012 0144   CO2 27 07/28/2012 1507   BUN 56* 08/24/2012 0144   BUN 18.5 07/28/2012 1507   CREATININE 1.31 08/24/2012 0144   CREATININE 1.1 07/28/2012 1507      Component Value Date/Time   CALCIUM 9.0 08/24/2012 0144   CALCIUM 8.7 07/28/2012 1507   ALKPHOS 85 06/08/2012 1340   ALKPHOS 86 05/02/2011 1519   AST 13 06/08/2012 1340   AST 17 05/02/2011 1519   ALT 17 06/08/2012 1340   ALT 17 05/02/2011 1519   BILITOT 0.62 06/08/2012 1340   BILITOT 0.3 05/02/2011 1519       Studies/Results: Dg Chest Port 1 View  08/20/2012  *RADIOLOGY REPORT*  Clinical Data: Follow-up evaluation of lung infiltrates.  PORTABLE CHEST - 1 VIEW  Comparison: Chest x-ray 08/18/2012.  Findings: Extensive bibasilar opacities may reflect areas of atelectasis and/or consolidation, with superimposed  small to moderate bilateral pleural effusions. There is cephalization of the pulmonary vasculature, indistinctness of the interstitial markings, and patchy airspace disease throughout the lungs bilaterally suggestive of moderate pulmonary edema.  Mild cardiomegaly.  The patient is rotated to the right on today's exam, resulting in distortion of the mediastinal contours and reduced diagnostic sensitivity and specificity for mediastinal pathology. Atherosclerosis in the thoracic aorta.  IMPRESSION: 1.  Overall aeration has worsened in the lungs bilaterally, with the appearance most suggestive of worsening congestive heart failure, as discussed above.   Original Report Authenticated By: Trudie Reed, M.D.    Dg Chest Port 1 View  08/18/2012  *RADIOLOGY REPORT*  Clinical Data: Shortness of breath.  History of hypertension  PORTABLE CHEST - 1 VIEW  Comparison: None.  Findings: Low lung volumes are identified.  Heart size is upper limits of normal.  Mild aortic ectasia with thoracic aortic calcification is noted.  Low lung volumes are present.  An area of focal density is identified at the left lung base with question small left pleural effusion and septal interstitial lines.  This could represent an area of focal pneumonia however if the patient lays predominately with the left side down this could represent an area of focal alveolar edema.  No stigmata suggestive of interstitial edema is seen elsewhere in the lungs which are relatively clear with exception of some focal volume  loss at the right lung base.  Bony structures demonstrate age appropriate osteopenia.  IMPRESSION: Left lower lobe density could represent either pneumonia atelectasis or positional edema.   Original Report Authenticated By: Rhodia Albright, M.D.     Medications: I have reviewed the patient's current medications.  Assessment/Plan:  1. 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 He began Revlimid 11/30/2009.  Revlimid was placed on hold 12/13/2009 when the absolute neutrophil count returned at 0.5. The Revlimid was resumed at a dose of 10 mg every other day on 12/21/2009. The anemia improved. Revlimid was decreased to 5 mg every other day 01/2010 due to neutropenia. Revlimid was discontinued following an office visit 02/28/2012 due to progressive anemia and leukopenia. Bone marrow biopsy on 04/16/2012 confirmed changes of myelodysplasia with no increase in blasts (5q-and 9:11 translocation). Revlimid resumed on 05/27/2012. Increased to 5 mg daily on 06/23/2012. Revlimid was discontinued during a hospitalization February 2014 due to continued severe anemia. 2. Probable cellulitis of the left lower posterior leg 12/13/2009, resolved following treatment with ciprofloxacin.  3. Left lower extremity edema and pain 12/13/2009 with a negative venous Doppler. 4. History of hypertension. 5. Diabetes. 6. History of a cerebrovascular accident. 7. Hyperlipidemia. 8. Peripheral vascular disease. 9. History of neutropenia secondary to Revlimid. 10. Mild thrombocytopenia. Potentially related to Revlimid. Platelet count in normal range today. 11. Status post kyphoplasty L3 and L4 on 07/31/2011 for compression fractures. 12. Neutropenia on labs 05/27/2012. Question secondary to MDS. 13. Mildly elevated creatinine 06/08/2012. 14. Hospitalization 06/16/2013 through 08/24/2012 with dyspnea found to be in acute congestive heart failure related to severe anemia.  Disposition-Aaron Bruce has persistent severe anemia requiring transfusion support despite Revlimid. The Revlimid was recently discontinued when he was hospitalized with acute CHF felt to be secondary to the anemia.  He has progressive anemia on labs today. He will receive 2 units of packed red cells. Dr. Truett Perna also discussed a trial of Procrit 60,000 units weekly. Potential toxicities were reviewed. He will receive the first injection today.  He has had  significant weight gain over the past several weeks. He will receive 40 mg of Lasix IV between units of blood today. His family will contact Dr. Mayford Knife, cardiology, regarding the weight gain/need for diuretic therapy on a chronic basis.  We will see [name] in followup in 4 weeks. He will contact the office in the interim with any problems.  Patient seen with Dr. Truett Perna.    Lonna Cobb ANP/GNP-BC

## 2012-09-08 NOTE — Patient Instructions (Addendum)
Blood Transfusion  A blood transfusion replaces your blood or some of its parts. Blood is replaced when you have lost blood because of surgery, an accident, or for severe blood conditions like anemia. You can donate blood to be used on yourself if you have a planned surgery. If you lose blood during that surgery, your own blood can be given back to you. Any blood given to you is checked to make sure it matches your blood type. Your temperature, blood pressure, and heart rate (vital signs) will be checked often.  GET HELP RIGHT AWAY IF:   You feel sick to your stomach (nauseous) or throw up (vomit).  You have watery poop (diarrhea).  You have shortness of breath or trouble breathing.  You have blood in your pee (urine) or have dark colored pee.  You have chest pain or tightness.  Your eyes or skin turn yellow (jaundice).  You have a temperature by mouth above 102 F (38.9 C), not controlled by medicine.  You start to shake and have chills.  You develop a a red rash (hives) or feel itchy.  You develop lightheadedness or feel confused.  You develop back, joint, or muscle pain.  You do not feel hungry (lost appetite).  You feel tired, restless, or nervous.  You develop belly (abdominal) cramps. Document Released: 09/13/2008 Document Revised: 09/09/2011 Document Reviewed: 09/13/2008 [facility] Patient Information 2013 [place], [place].  Epoetin Alfa injection What is this medicine? EPOETIN ALFA (e POE e tin AL fa) helps your body make more red blood cells. This medicine is used to treat anemia caused by chronic kidney failure, cancer chemotherapy, or HIV-therapy. It may also be used before surgery if you have anemia. This medicine may be used for other purposes; ask your health care Brettney Ficken or pharmacist if you have questions. What should I tell my health care Carmelina Balducci before I take this medicine? They need to know if you have any of these conditions: -blood clotting  disorders -cancer patient not on chemotherapy -cystic fibrosis -heart disease, such as angina or heart failure -hemoglobin level of 12 g/dL or greater -high blood pressure -low levels of folate, iron, or vitamin B12 -seizures -an unusual or allergic reaction to erythropoietin, albumin, benzyl alcohol, hamster proteins, other medicines, foods, dyes, or preservatives -pregnant or trying to get pregnant -breast-feeding How should I use this medicine? This medicine is for injection into a vein or under the skin. It is usually given by a health care professional in a hospital or clinic setting. If you get this medicine at home, you will be taught how to prepare and give this medicine. Use exactly as directed. Take your medicine at regular intervals. Do not take your medicine more often than directed. It is important that you put your used needles and syringes in a special sharps container. Do not put them in a trash can. If you do not have a sharps container, call your pharmacist or healthcare Loral Campi to get one. Talk to your pediatrician regarding the use of this medicine in children. While this drug may be prescribed for selected conditions, precautions do apply. Overdosage: If you think you have taken too much of this medicine contact a poison control center or emergency room at once. NOTE: This medicine is only for you. Do not share this medicine with others. What if I miss a dose? If you miss a dose, take it as soon as you can. If it is almost time for your next dose, take only that dose.  Do not take double or extra doses. What may interact with this medicine? Do not take this medicine with any of the following medications: -darbepoetin alfa This list may not describe all possible interactions. Give your health care Burlon Centrella a list of all the medicines, herbs, non-prescription drugs, or dietary supplements you use. Also tell them if you smoke, drink alcohol, or use illegal drugs. Some items  may interact with your medicine. What should I watch for while using this medicine? Visit your prescriber or health care professional for regular checks on your progress and for the needed blood tests and blood pressure measurements. It is especially important for the doctor to make sure your hemoglobin level is in the desired range, to limit the risk of potential side effects and to give you the best benefit. Keep all appointments for any recommended tests. Check your blood pressure as directed. Ask your doctor what your blood pressure should be and when you should contact him or her. As your body makes more red blood cells, you may need to take iron, folic acid, or vitamin B supplements. Ask your doctor or health care Airiel Oblinger which products are right for you. If you have kidney disease continue dietary restrictions, even though this medication can make you feel better. Talk with your doctor or health care professional about the foods you eat and the vitamins that you take. What side effects may I notice from receiving this medicine? Side effects that you should report to your doctor or health care professional as soon as possible: -allergic reactions like skin rash, itching or hives, swelling of the face, lips, or tongue -breathing problems -changes in vision -chest pain -confusion, trouble speaking or understanding -feeling faint or lightheaded, falls -high blood pressure -muscle aches or pains -pain, swelling, warmth in the leg -rapid weight gain -severe headaches -sudden numbness or weakness of the face, arm or leg -trouble walking, dizziness, loss of balance or coordination -seizures (convulsions) -swelling of the ankles, feet, hands -unusually weak or tired Side effects that usually do not require medical attention (report to your doctor or health care professional if they continue or are bothersome): -diarrhea -fever, chills (flu-like symptoms) -headaches -nausea,  vomiting -redness, stinging, or swelling at site where injected This list may not describe all possible side effects. Call your doctor for medical advice about side effects. You may report side effects to FDA at 1-800-FDA-1088. Where should I keep my medicine? Keep out of the reach of children. Store in a refrigerator between 2 and 8 degrees C (36 and 46 degrees F). Do not freeze or shake. Throw away any unused portion if using a single-dose vial. Multi-dose vials can be kept in the refrigerator for up to 21 days after the initial dose. Throw away unused medicine. NOTE: This sheet is a summary. It may not cover all possible information. If you have questions about this medicine, talk to your doctor, pharmacist, or health care Sun Wilensky.  2013, [employer]. (05/31/2008 10:25:44 AM)

## 2012-09-09 LAB — TYPE AND SCREEN: Unit division: 0

## 2012-09-14 ENCOUNTER — Other Ambulatory Visit: Payer: Self-pay | Admitting: *Deleted

## 2012-09-14 DIAGNOSIS — D46C Myelodysplastic syndrome with isolated del(5q) chromosomal abnormality: Secondary | ICD-10-CM

## 2012-09-15 ENCOUNTER — Other Ambulatory Visit (HOSPITAL_BASED_OUTPATIENT_CLINIC_OR_DEPARTMENT_OTHER): Payer: Medicare Other | Admitting: Lab

## 2012-09-15 ENCOUNTER — Ambulatory Visit (HOSPITAL_BASED_OUTPATIENT_CLINIC_OR_DEPARTMENT_OTHER): Payer: Medicare Other

## 2012-09-15 VITALS — BP 137/52 | HR 75 | Temp 98.6°F

## 2012-09-15 DIAGNOSIS — D46C Myelodysplastic syndrome with isolated del(5q) chromosomal abnormality: Secondary | ICD-10-CM

## 2012-09-15 DIAGNOSIS — D649 Anemia, unspecified: Secondary | ICD-10-CM

## 2012-09-15 LAB — CBC WITH DIFFERENTIAL/PLATELET
Eosinophils Absolute: 0.4 10*3/uL (ref 0.0–0.5)
MONO#: 0.7 10*3/uL (ref 0.1–0.9)
NEUT#: 2.8 10*3/uL (ref 1.5–6.5)
Platelets: 104 10*3/uL — ABNORMAL LOW (ref 140–400)
RBC: 2.71 10*6/uL — ABNORMAL LOW (ref 4.20–5.82)
RDW: 15.1 % — ABNORMAL HIGH (ref 11.0–14.6)
WBC: 5.3 10*3/uL (ref 4.0–10.3)
lymph#: 1.3 10*3/uL (ref 0.9–3.3)

## 2012-09-15 LAB — HOLD TUBE, BLOOD BANK

## 2012-09-15 MED ORDER — EPOETIN ALFA 20000 UNIT/ML IJ SOLN
60000.0000 [IU] | Freq: Once | INTRAMUSCULAR | Status: AC
  Administered 2012-09-15: 60000 [IU] via SUBCUTANEOUS
  Filled 2012-09-15: qty 3

## 2012-09-15 NOTE — Patient Instructions (Signed)
Epoetin Alfa injection What is this medicine? EPOETIN ALFA (e POE e tin AL fa) helps your body make more red blood cells. This medicine is used to treat anemia caused by chronic kidney failure, cancer chemotherapy, or HIV-therapy. It may also be used before surgery if you have anemia. This medicine may be used for other purposes; ask your health care provider or pharmacist if you have questions. What should I tell my health care provider before I take this medicine? They need to know if you have any of these conditions: -blood clotting disorders -cancer patient not on chemotherapy -cystic fibrosis -heart disease, such as angina or heart failure -hemoglobin level of 12 g/dL or greater -high blood pressure -low levels of folate, iron, or vitamin B12 -seizures -an unusual or allergic reaction to erythropoietin, albumin, benzyl alcohol, hamster proteins, other medicines, foods, dyes, or preservatives -pregnant or trying to get pregnant -breast-feeding How should I use this medicine? This medicine is for injection into a vein or under the skin. It is usually given by a health care professional in a hospital or clinic setting. If you get this medicine at home, you will be taught how to prepare and give this medicine. Use exactly as directed. Take your medicine at regular intervals. Do not take your medicine more often than directed. It is important that you put your used needles and syringes in a special sharps container. Do not put them in a trash can. If you do not have a sharps container, call your pharmacist or healthcare provider to get one. Talk to your pediatrician regarding the use of this medicine in children. While this drug may be prescribed for selected conditions, precautions do apply. Overdosage: If you think you have taken too much of this medicine contact a poison control center or emergency room at once. NOTE: This medicine is only for you. Do not share this medicine with  others. What if I miss a dose? If you miss a dose, take it as soon as you can. If it is almost time for your next dose, take only that dose. Do not take double or extra doses. What may interact with this medicine? Do not take this medicine with any of the following medications: -darbepoetin alfa This list may not describe all possible interactions. Give your health care provider a list of all the medicines, herbs, non-prescription drugs, or dietary supplements you use. Also tell them if you smoke, drink alcohol, or use illegal drugs. Some items may interact with your medicine. What should I watch for while using this medicine? Visit your prescriber or health care professional for regular checks on your progress and for the needed blood tests and blood pressure measurements. It is especially important for the doctor to make sure your hemoglobin level is in the desired range, to limit the risk of potential side effects and to give you the best benefit. Keep all appointments for any recommended tests. Check your blood pressure as directed. Ask your doctor what your blood pressure should be and when you should contact him or her. As your body makes more red blood cells, you may need to take iron, folic acid, or vitamin B supplements. Ask your doctor or health care provider which products are right for you. If you have kidney disease continue dietary restrictions, even though this medication can make you feel better. Talk with your doctor or health care professional about the foods you eat and the vitamins that you take. What side effects may I notice   from receiving this medicine? Side effects that you should report to your doctor or health care professional as soon as possible: -allergic reactions like skin rash, itching or hives, swelling of the face, lips, or tongue -breathing problems -changes in vision -chest pain -confusion, trouble speaking or understanding -feeling faint or lightheaded,  falls -high blood pressure -muscle aches or pains -pain, swelling, warmth in the leg -rapid weight gain -severe headaches -sudden numbness or weakness of the face, arm or leg -trouble walking, dizziness, loss of balance or coordination -seizures (convulsions) -swelling of the ankles, feet, hands -unusually weak or tired Side effects that usually do not require medical attention (report to your doctor or health care professional if they continue or are bothersome): -diarrhea -fever, chills (flu-like symptoms) -headaches -nausea, vomiting -redness, stinging, or swelling at site where injected This list may not describe all possible side effects. Call your doctor for medical advice about side effects. You may report side effects to FDA at 1-800-FDA-1088. Where should I keep my medicine? Keep out of the reach of children. Store in a refrigerator between 2 and 8 degrees C (36 and 46 degrees F). Do not freeze or shake. Throw away any unused portion if using a single-dose vial. Multi-dose vials can be kept in the refrigerator for up to 21 days after the initial dose. Throw away unused medicine. NOTE: This sheet is a summary. It may not cover all possible information. If you have questions about this medicine, talk to your doctor, pharmacist, or health care provider.  2013, [employer]vier/[employer] Standard. (05/31/2008 10:25:44 AM)  

## 2012-09-22 ENCOUNTER — Other Ambulatory Visit (HOSPITAL_BASED_OUTPATIENT_CLINIC_OR_DEPARTMENT_OTHER): Payer: Medicare Other

## 2012-09-22 ENCOUNTER — Ambulatory Visit (HOSPITAL_BASED_OUTPATIENT_CLINIC_OR_DEPARTMENT_OTHER): Payer: Medicare Other

## 2012-09-22 ENCOUNTER — Ambulatory Visit: Payer: Medicare Other

## 2012-09-22 ENCOUNTER — Other Ambulatory Visit (HOSPITAL_COMMUNITY): Payer: Self-pay | Admitting: Geriatric Medicine

## 2012-09-22 VITALS — BP 148/41 | HR 60 | Temp 98.4°F

## 2012-09-22 VITALS — BP 137/48 | HR 60 | Temp 98.1°F | Resp 18

## 2012-09-22 DIAGNOSIS — D46C Myelodysplastic syndrome with isolated del(5q) chromosomal abnormality: Secondary | ICD-10-CM

## 2012-09-22 DIAGNOSIS — D649 Anemia, unspecified: Secondary | ICD-10-CM

## 2012-09-22 DIAGNOSIS — R1310 Dysphagia, unspecified: Secondary | ICD-10-CM

## 2012-09-22 LAB — CBC WITH DIFFERENTIAL/PLATELET
BASO%: 2 % (ref 0.0–2.0)
Basophils Absolute: 0.1 10*3/uL (ref 0.0–0.1)
HCT: 21.2 % — ABNORMAL LOW (ref 38.4–49.9)
HGB: 7.1 g/dL — ABNORMAL LOW (ref 13.0–17.1)
MONO#: 0.4 10*3/uL (ref 0.1–0.9)
NEUT#: 2.1 10*3/uL (ref 1.5–6.5)
NEUT%: 51.3 % (ref 39.0–75.0)
WBC: 4.1 10*3/uL (ref 4.0–10.3)
lymph#: 1.2 10*3/uL (ref 0.9–3.3)

## 2012-09-22 MED ORDER — SODIUM CHLORIDE 0.9 % IV SOLN
250.0000 mL | Freq: Once | INTRAVENOUS | Status: AC
  Administered 2012-09-22: 250 mL via INTRAVENOUS

## 2012-09-22 MED ORDER — EPOETIN ALFA 20000 UNIT/ML IJ SOLN
60000.0000 [IU] | Freq: Once | INTRAMUSCULAR | Status: AC
  Administered 2012-09-22: 60000 [IU] via SUBCUTANEOUS
  Filled 2012-09-22: qty 3

## 2012-09-22 NOTE — Patient Instructions (Addendum)
Blood Transfusion Information WHAT IS A BLOOD TRANSFUSION? A transfusion is the replacement of blood or some of its parts. Blood is made up of multiple cells which provide different functions.  Red blood cells carry oxygen and are used for blood loss replacement.  White blood cells fight against infection.  Platelets control bleeding.  Plasma helps clot blood.  Other blood products are available for specialized needs, such as hemophilia or other clotting disorders. BEFORE THE TRANSFUSION  Who gives blood for transfusions?   You may be able to donate blood to be used at a later date on yourself (autologous donation).  Relatives can be asked to donate blood. This is generally not any safer than if you have received blood from a stranger. The same precautions are taken to ensure safety when a relative's blood is donated.  Healthy volunteers who are fully evaluated to make sure their blood is safe. This is blood bank blood. Transfusion therapy is the safest it has ever been in the practice of medicine. Before blood is taken from a donor, a complete history is taken to make sure that person has no history of diseases nor engages in risky social behavior (examples are intravenous drug use or sexual activity with multiple partners). The donor's travel history is screened to minimize risk of transmitting infections, such as malaria. The donated blood is tested for signs of infectious diseases, such as HIV and hepatitis. The blood is then tested to be sure it is compatible with you in order to minimize the chance of a transfusion reaction. If you or a relative donates blood, this is often done in anticipation of surgery and is not appropriate for emergency situations. It takes many days to process the donated blood. RISKS AND COMPLICATIONS Although transfusion therapy is very safe and saves many lives, the main dangers of transfusion include:   Getting an infectious disease.  Developing a  transfusion reaction. This is an allergic reaction to something in the blood you were given. Every precaution is taken to prevent this. The decision to have a blood transfusion has been considered carefully by your caregiver before blood is given. Blood is not given unless the benefits outweigh the risks. AFTER THE TRANSFUSION  Right after receiving a blood transfusion, you will usually feel much better and more energetic. This is especially true if your red blood cells have gotten low (anemic). The transfusion raises the level of the red blood cells which carry oxygen, and this usually causes an energy increase.  The nurse administering the transfusion will monitor you carefully for complications. HOME CARE INSTRUCTIONS  No special instructions are needed after a transfusion. You may find your energy is better. Speak with your caregiver about any limitations on activity for underlying diseases you may have. SEEK MEDICAL CARE IF:   Your condition is not improving after your transfusion.  You develop redness or irritation at the intravenous (IV) site. SEEK IMMEDIATE MEDICAL CARE IF:  Any of the following symptoms occur over the next 12 hours:  Shaking chills.  You have a temperature by mouth above 102 F (38.9 C), not controlled by medicine.  Chest, back, or muscle pain.  People around you feel you are not acting correctly or are confused.  Shortness of breath or difficulty breathing.  Dizziness and fainting.  You get a rash or develop hives.  You have a decrease in urine output.  Your urine turns a dark color or changes to pink, red, or brown. Any of the following   symptoms occur over the next 10 days:  You have a temperature by mouth above 102 F (38.9 C), not controlled by medicine.  Shortness of breath.  Weakness after normal activity.  The white part of the eye turns yellow (jaundice).  You have a decrease in the amount of urine or are urinating less often.  Your  urine turns a dark color or changes to pink, red, or brown. Document Released: 06/14/2000 Document Revised: 09/09/2011 Document Reviewed: 02/01/2008 [employer] Patient Information 2013 [employer].  

## 2012-09-23 LAB — TYPE AND SCREEN
ABO/RH(D): O POS
Antibody Screen: NEGATIVE
Unit division: 0
Unit division: 0

## 2012-09-25 ENCOUNTER — Ambulatory Visit (HOSPITAL_COMMUNITY)
Admission: RE | Admit: 2012-09-25 | Discharge: 2012-09-25 | Disposition: A | Discharge status: Home / Self Care | Payer: Medicare Other | Source: Ambulatory Visit | Attending: Geriatric Medicine | Admitting: Geriatric Medicine

## 2012-09-25 DIAGNOSIS — R1310 Dysphagia, unspecified: Secondary | ICD-10-CM

## 2012-09-25 DIAGNOSIS — R1313 Dysphagia, pharyngeal phase: Secondary | ICD-10-CM | POA: Insufficient documentation

## 2012-09-25 NOTE — Procedures (Signed)
Objective Swallowing Evaluation: Modified Barium Swallowing Study  Patient Details  Name: Aaron Bruce MRN: [id number] Date of Birth: [birth year]  Today's Date: 09/25/2012 Time: 1308-6578 SLP Time Calculation (min): 30 min  Past Medical History:  Past Medical History  Diagnosis Date  . Anemia   . Hypertension   . Diabetes mellitus   . Stroke   . Hyperlipemia   . Peripheral vascular disease   . Arthritis     Osteoarthritis  . Irritable bowel syndrome (IBS)   . Myelodysplasia with 5q deletion   . Thrombocytopenia     stable--due to Revlimid   Past Surgical History:  Past Surgical History  Procedure Laterality Date  . Tonsillectomy  1948  . Kidney stone surgery  1950's   HPI:  79 yr old seen for outpatient MBS accompanied by his sister-in-law.  Documentation from SLP at SNF reports coughing with thin liquids and liquids were downgraded to nectar thick.  PMH includes myelodysplastic syndrome, anemia, UTI, DM 2, HTN, hypoxemia, fasciolopsiasis.     Assessment / Plan / Recommendation Clinical Impression  Dysphagia Diagnosis: Mild pharyngeal phase dysphagia;Moderate pharyngeal phase dysphagia Clinical impression: Overall, pt. exhibited a mild-moderate pharyngeal dysphagia primarily due to decreased pharyngeal sensation.  Swallow initiation was consistently delayed to the level of the valleculae with liquid consistencies.  Teaspoon of thin barium was grossly aspirated during the swallow due to decreased initiation of swallow and delayed epiglottic deflection.  Reflextive response to aspirated barium was a minimal throat clear.  Pt. was cued for chin tuck posture which was not effective in preventing aspiration (continued to aspirate).  Vallecular residue was intermittent and trace only.  Prior to consumption of po's pt.'s vocal qualtiy was noted to be wet indicative of likely penetration/aspiration of secretions. Brief scan of esophagus did not reveal abnormalities (no radiologist  present to confirm).  Barium pill administered in applesauce transited to posterior oral cavity, pharynx and esophagus without difficulty.  Prior to consumption of po's pt.'s vocal qualtiy was noted to be wet indicative of likely penetration/aspiration of secretions.  No difficulty with oral prep, mastication or transit of solid texture.  Recommend a regular diet texture (may benefit from chopped meats) and continue nectar thick liquids, pills whole in applesauce/pudding.           Treatment Recommendation  Defer treatment plan to SLP at (Comment)    Diet Recommendation Regular;Thin liquid (chopped meats)   Liquid Administration via: No straw;Cup Medication Administration: Crushed with puree Supervision: Patient able to self feed;Full supervision/cueing for compensatory strategies Compensations: Slow rate;Small sips/bites Postural Changes and/or Swallow Maneuvers: Seated upright 90 degrees    Other  Recommendations Oral Care Recommendations: Oral care BID   Follow Up Recommendations  Skilled Nursing facility    Frequency and Duration        Pertinent Vitals/Pain none    SLP Swallow Goals     General HPI: 79 yr old seen for outpatient MBS accompanied by his sister-in-law.  Documentation from SLP at SNF reports coughing with thin liquids and liquids were downgraded to nectar thick.  PMH includes myelodysplastic syndrome, anemia, UTI, DM 2, HTN, hypoxemia, fasciolopsiasis. Type of Study: Modified Barium Swallowing Study Reason for Referral: Objectively evaluate swallowing function Previous Swallow Assessment: none Diet Prior to this Study: Nectar-thick liquids (uncertain of current texture) Respiratory Status: Room air History of Recent Intubation: No Behavior/Cognition: Alert;Cooperative;Requires cueing Oral Cavity - Dentition: Adequate natural dentition Oral Motor / Sensory Function: Within functional limits Self-Feeding Abilities: Able to feed  self Patient Positioning: Upright  in chair Baseline Vocal Quality: Wet;Low vocal intensity Anatomy: Within functional limits Pharyngeal Secretions: Not observed secondary MBS    Reason for Referral Objectively evaluate swallowing function   Oral Phase Oral Preparation/Oral Phase Oral Phase: WFL   Pharyngeal Phase Pharyngeal Phase Pharyngeal Phase: Impaired Pharyngeal - Nectar Pharyngeal - Nectar Teaspoon: Delayed swallow initiation;Premature spillage to valleculae;Pharyngeal residue - valleculae;Pharyngeal residue - pyriform sinuses (trace residue) Penetration/Aspiration details (nectar teaspoon): Material enters airway, remains ABOVE vocal cords then ejected out (flash penetrated) Pharyngeal - Nectar Cup: Delayed swallow initiation;Premature spillage to valleculae;Pharyngeal residue - valleculae;Pharyngeal residue - pyriform sinuses;Penetration/Aspiration during swallow;Reduced laryngeal elevation Penetration/Aspiration details (nectar cup): Material enters airway, remains ABOVE vocal cords then ejected out (flash penetration x 1) Pharyngeal - Thin Pharyngeal - Thin Teaspoon: Reduced airway/laryngeal closure;Reduced laryngeal elevation;Reduced anterior laryngeal mobility;Penetration/Aspiration during swallow;Delayed swallow initiation;Premature spillage to valleculae Penetration/Aspiration details (thin teaspoon): Material enters airway, passes BELOW cords without attempt by patient to eject out (silent aspiration) (produced a slight throat clear) Pharyngeal - Thin Cup: Not tested Pharyngeal - Solids Pharyngeal - Regular: Within functional limits Pharyngeal - Pill: Within functional limits  Cervical Esophageal Phase        Cervical Esophageal Phase Cervical Esophageal Phase: [facility]         Darrow Bussing.Ed [employer] [phone]  09/25/2012

## 2012-09-28 ENCOUNTER — Other Ambulatory Visit: Payer: Self-pay | Admitting: *Deleted

## 2012-09-28 DIAGNOSIS — D46C Myelodysplastic syndrome with isolated del(5q) chromosomal abnormality: Secondary | ICD-10-CM

## 2012-09-28 DIAGNOSIS — D649 Anemia, unspecified: Secondary | ICD-10-CM

## 2012-09-29 ENCOUNTER — Ambulatory Visit (HOSPITAL_BASED_OUTPATIENT_CLINIC_OR_DEPARTMENT_OTHER): Payer: Medicare Other

## 2012-09-29 ENCOUNTER — Other Ambulatory Visit (HOSPITAL_BASED_OUTPATIENT_CLINIC_OR_DEPARTMENT_OTHER): Payer: Medicare Other | Admitting: Lab

## 2012-09-29 VITALS — BP 147/42 | HR 60 | Temp 98.3°F

## 2012-09-29 DIAGNOSIS — D649 Anemia, unspecified: Secondary | ICD-10-CM

## 2012-09-29 DIAGNOSIS — D46C Myelodysplastic syndrome with isolated del(5q) chromosomal abnormality: Secondary | ICD-10-CM

## 2012-09-29 LAB — CBC WITH DIFFERENTIAL/PLATELET
BASO%: 1.3 % (ref 0.0–2.0)
EOS%: 2.7 % (ref 0.0–7.0)
LYMPH%: 27.9 % (ref 14.0–49.0)
MCH: 29.8 pg (ref 27.2–33.4)
MCHC: 32.5 g/dL (ref 32.0–36.0)
MONO#: 0.6 10*3/uL (ref 0.1–0.9)
MONO%: 13.2 % (ref 0.0–14.0)
NEUT%: 54.9 % (ref 39.0–75.0)
Platelets: 135 10*3/uL — ABNORMAL LOW (ref 140–400)
RBC: 2.89 10*6/uL — ABNORMAL LOW (ref 4.20–5.82)
WBC: 4.4 10*3/uL (ref 4.0–10.3)

## 2012-09-29 LAB — HOLD TUBE, BLOOD BANK

## 2012-09-29 MED ORDER — EPOETIN ALFA 20000 UNIT/ML IJ SOLN
60000.0000 [IU] | Freq: Once | INTRAMUSCULAR | Status: AC
  Administered 2012-09-29: 60000 [IU] via SUBCUTANEOUS
  Filled 2012-09-29: qty 3

## 2012-10-01 ENCOUNTER — Encounter: Payer: Self-pay | Admitting: Oncology

## 2012-10-05 ENCOUNTER — Other Ambulatory Visit: Payer: Self-pay | Admitting: *Deleted

## 2012-10-05 DIAGNOSIS — D46C Myelodysplastic syndrome with isolated del(5q) chromosomal abnormality: Secondary | ICD-10-CM

## 2012-10-06 ENCOUNTER — Telehealth: Payer: Self-pay | Admitting: Oncology

## 2012-10-06 ENCOUNTER — Ambulatory Visit (HOSPITAL_BASED_OUTPATIENT_CLINIC_OR_DEPARTMENT_OTHER): Payer: Medicare Other

## 2012-10-06 ENCOUNTER — Other Ambulatory Visit (HOSPITAL_BASED_OUTPATIENT_CLINIC_OR_DEPARTMENT_OTHER): Payer: Medicare Other | Admitting: Lab

## 2012-10-06 ENCOUNTER — Ambulatory Visit (HOSPITAL_BASED_OUTPATIENT_CLINIC_OR_DEPARTMENT_OTHER): Payer: Medicare Other | Admitting: Oncology

## 2012-10-06 ENCOUNTER — Other Ambulatory Visit: Payer: Self-pay | Admitting: *Deleted

## 2012-10-06 VITALS — BP 158/51 | HR 61 | Temp 97.9°F | Resp 20 | Ht 66.0 in | Wt 163.9 lb

## 2012-10-06 DIAGNOSIS — D46C Myelodysplastic syndrome with isolated del(5q) chromosomal abnormality: Secondary | ICD-10-CM

## 2012-10-06 DIAGNOSIS — D539 Nutritional anemia, unspecified: Secondary | ICD-10-CM

## 2012-10-06 DIAGNOSIS — D649 Anemia, unspecified: Secondary | ICD-10-CM

## 2012-10-06 DIAGNOSIS — D4620 Refractory anemia with excess of blasts, unspecified: Secondary | ICD-10-CM

## 2012-10-06 LAB — CBC WITH DIFFERENTIAL/PLATELET
BASO%: 1.4 % (ref 0.0–2.0)
EOS%: 2.8 % (ref 0.0–7.0)
HGB: 7.9 g/dL — ABNORMAL LOW (ref 13.0–17.1)
MCH: 30.5 pg (ref 27.2–33.4)
MCHC: 32 g/dL (ref 32.0–36.0)
MCV: 95.4 fL (ref 79.3–98.0)
MONO%: 10.5 % (ref 0.0–14.0)
RBC: 2.59 10*6/uL — ABNORMAL LOW (ref 4.20–5.82)
RDW: 20 % — ABNORMAL HIGH (ref 11.0–14.6)
lymph#: 1.1 10*3/uL (ref 0.9–3.3)

## 2012-10-06 MED ORDER — EPOETIN ALFA 20000 UNIT/ML IJ SOLN
60000.0000 [IU] | Freq: Once | INTRAMUSCULAR | Status: AC
  Administered 2012-10-06: 60000 [IU] via SUBCUTANEOUS
  Filled 2012-10-06: qty 3

## 2012-10-06 NOTE — Telephone Encounter (Signed)
gv adn printed appt schedule for April and May

## 2012-10-06 NOTE — Progress Notes (Signed)
   [facility]    OFFICE PROGRESS NOTE   INTERVAL HISTORY:   He returns as scheduled. He has been maintained on weekly Procrit for the past month. He was last transfused on 09/22/2012 hemoglobin returned at 7.1. No new complaint.  Objective:  Vital signs in last 24 hours:  Blood pressure 158/51, pulse 61, temperature 97.9 F (36.6 C), temperature source Oral, resp. rate 20, height 5\' 6"  (1.676 m), weight 163 lb 14.4 oz (74.345 kg).   Resp: Inspiratory rhonchi at the left base, no respiratory distress Cardio: Regular rate and rhythm GI: No hepatomegaly Vascular: Trace pitting edema at the left greater than right lower leg   Lab Results:  Lab Results  Component Value Date   WBC 4.3 10/06/2012   HGB 7.9* 10/06/2012   HCT 24.7* 10/06/2012   MCV 95.4 10/06/2012   PLT 128* 10/06/2012      Medications: I have reviewed the patient's current medications.  Assessment/Plan: 1. 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 He began Revlimid 11/30/2009. Revlimid was placed on hold 12/13/2009 when the absolute neutrophil count returned at 0.5. The Revlimid was resumed at a dose of 10 mg every other day on 12/21/2009. The anemia improved. Revlimid was decreased to 5 mg every other day 01/2010 due to neutropenia. Revlimid was discontinued following an office visit 02/28/2012 due to progressive anemia and leukopenia. Bone marrow biopsy on 04/16/2012 confirmed changes of myelodysplasia with no increase in blasts (5q-and 9:11 translocation). Revlimid resumed on 05/27/2012. Increased to 5 mg daily on 06/23/2012. Revlimid was discontinued during a hospitalization February 2014 due to continued severe anemia. He began a trial of weekly Procrit on 09/08/2012 2. Probable cellulitis of the left lower posterior leg 12/13/2009, resolved following treatment with ciprofloxacin.  3. Left lower extremity edema and pain 12/13/2009 with a negative venous Doppler. 4. History of  hypertension. 5. Diabetes. 6. History of a cerebrovascular accident. 7. Hyperlipidemia. 8. Peripheral vascular disease. 9. History of neutropenia secondary to Revlimid. 10. Mild thrombocytopenia. Likely secondary to myelodysplasia 11. Status post kyphoplasty L3 and L4 on 07/31/2011 for compression fractures. 12. Neutropenia on labs 05/27/2012. Question secondary to MDS. 13. Mildly elevated creatinine 06/08/2012. 14. Hospitalization 06/16/2013 through 08/24/2012 with dyspnea found to be in acute congestive heart failure related to severe anemia.  Disposition:  He appears stable. He does not appear symptomatic from the anemia today. The plan is to continue Procrit for one more month to see if the hemoglobin will stabilize. We will check a hemoglobin and provide transfusion support as needed when he returns next week.  Ms. Samuella Cota will return for an office visit in one month. We will consider a trial of 5 azacytidine if he continues to require frequent transfusions despite Procrit. He will begin ferrous sulfate.   Thornton Papas, MD  10/06/2012  11:32 AM

## 2012-10-13 ENCOUNTER — Other Ambulatory Visit: Payer: Self-pay | Admitting: *Deleted

## 2012-10-13 ENCOUNTER — Ambulatory Visit (HOSPITAL_BASED_OUTPATIENT_CLINIC_OR_DEPARTMENT_OTHER): Payer: Medicare Other

## 2012-10-13 ENCOUNTER — Encounter (HOSPITAL_COMMUNITY)
Admission: RE | Admit: 2012-10-13 | Discharge: 2012-10-13 | Disposition: A | Discharge status: Home / Self Care | Payer: Medicare Other | Source: Ambulatory Visit | Attending: Oncology | Admitting: Oncology

## 2012-10-13 ENCOUNTER — Other Ambulatory Visit (HOSPITAL_BASED_OUTPATIENT_CLINIC_OR_DEPARTMENT_OTHER): Payer: Medicare Other | Admitting: Lab

## 2012-10-13 VITALS — BP 147/62 | HR 78 | Temp 98.7°F | Resp 18

## 2012-10-13 VITALS — BP 123/37 | HR 64 | Temp 98.5°F

## 2012-10-13 DIAGNOSIS — D46C Myelodysplastic syndrome with isolated del(5q) chromosomal abnormality: Secondary | ICD-10-CM

## 2012-10-13 DIAGNOSIS — D649 Anemia, unspecified: Secondary | ICD-10-CM | POA: Insufficient documentation

## 2012-10-13 LAB — CBC WITH DIFFERENTIAL/PLATELET
Basophils Absolute: 0.1 10*3/uL (ref 0.0–0.1)
Eosinophils Absolute: 0.2 10*3/uL (ref 0.0–0.5)
HGB: 7.2 g/dL — ABNORMAL LOW (ref 13.0–17.1)
NEUT#: 2.3 10*3/uL (ref 1.5–6.5)
RDW: 25.9 % — ABNORMAL HIGH (ref 11.0–14.6)
lymph#: 1.4 10*3/uL (ref 0.9–3.3)

## 2012-10-13 LAB — IRON AND TIBC
%SAT: 90 % — ABNORMAL HIGH (ref 20–55)
Iron: 179 ug/dL — ABNORMAL HIGH (ref 42–165)
TIBC: 198 ug/dL — ABNORMAL LOW (ref 215–435)
UIBC: 19 ug/dL — ABNORMAL LOW (ref 125–400)

## 2012-10-13 LAB — FERRITIN: Ferritin: 810 ng/mL — ABNORMAL HIGH (ref 22–322)

## 2012-10-13 MED ORDER — EPOETIN ALFA 20000 UNIT/ML IJ SOLN
60000.0000 [IU] | Freq: Once | INTRAMUSCULAR | Status: AC
  Administered 2012-10-13: 60000 [IU] via SUBCUTANEOUS
  Filled 2012-10-13: qty 3

## 2012-10-13 MED ORDER — SODIUM CHLORIDE 0.9 % IV SOLN
250.0000 mL | Freq: Once | INTRAVENOUS | Status: AC
  Administered 2012-10-13: 250 mL via INTRAVENOUS

## 2012-10-13 NOTE — Progress Notes (Signed)
Received labs drawn today at [facility] with Hgb 6.7. Forwarded to Dr. Truett Perna.

## 2012-10-13 NOTE — Patient Instructions (Addendum)
Blood Transfusion Information WHAT IS A BLOOD TRANSFUSION? A transfusion is the replacement of blood or some of its parts. Blood is made up of multiple cells which provide different functions.  Red blood cells carry oxygen and are used for blood loss replacement.  White blood cells fight against infection.  Platelets control bleeding.  Plasma helps clot blood.  Other blood products are available for specialized needs, such as hemophilia or other clotting disorders. BEFORE THE TRANSFUSION  Who gives blood for transfusions?   You may be able to donate blood to be used at a later date on yourself (autologous donation).  Relatives can be asked to donate blood. This is generally not any safer than if you have received blood from a stranger. The same precautions are taken to ensure safety when a relative's blood is donated.  Healthy volunteers who are fully evaluated to make sure their blood is safe. This is blood bank blood. Transfusion therapy is the safest it has ever been in the practice of medicine. Before blood is taken from a donor, a complete history is taken to make sure that person has no history of diseases nor engages in risky social behavior (examples are intravenous drug use or sexual activity with multiple partners). The donor's travel history is screened to minimize risk of transmitting infections, such as malaria. The donated blood is tested for signs of infectious diseases, such as HIV and hepatitis. The blood is then tested to be sure it is compatible with you in order to minimize the chance of a transfusion reaction. If you or a relative donates blood, this is often done in anticipation of surgery and is not appropriate for emergency situations. It takes many days to process the donated blood. RISKS AND COMPLICATIONS Although transfusion therapy is very safe and saves many lives, the main dangers of transfusion include:   Getting an infectious disease.  Developing a  transfusion reaction. This is an allergic reaction to something in the blood you were given. Every precaution is taken to prevent this. The decision to have a blood transfusion has been considered carefully by your caregiver before blood is given. Blood is not given unless the benefits outweigh the risks. AFTER THE TRANSFUSION  Right after receiving a blood transfusion, you will usually feel much better and more energetic. This is especially true if your red blood cells have gotten low (anemic). The transfusion raises the level of the red blood cells which carry oxygen, and this usually causes an energy increase.  The nurse administering the transfusion will monitor you carefully for complications. HOME CARE INSTRUCTIONS  No special instructions are needed after a transfusion. You may find your energy is better. Speak with your caregiver about any limitations on activity for underlying diseases you may have. SEEK MEDICAL CARE IF:   Your condition is not improving after your transfusion.  You develop redness or irritation at the intravenous (IV) site. SEEK IMMEDIATE MEDICAL CARE IF:  Any of the following symptoms occur over the next 12 hours:  Shaking chills.  You have a temperature by mouth above 102 F (38.9 C), not controlled by medicine.  Chest, back, or muscle pain.  People around you feel you are not acting correctly or are confused.  Shortness of breath or difficulty breathing.  Dizziness and fainting.  You get a rash or develop hives.  You have a decrease in urine output.  Your urine turns a dark color or changes to pink, red, or brown. Any of the following   symptoms occur over the next 10 days:  You have a temperature by mouth above 102 F (38.9 C), not controlled by medicine.  Shortness of breath.  Weakness after normal activity.  The white part of the eye turns yellow (jaundice).  You have a decrease in the amount of urine or are urinating less often.  Your  urine turns a dark color or changes to pink, red, or brown. Document Released: 06/14/2000 Document Revised: 09/09/2011 Document Reviewed: 02/01/2008 [employer] Patient Information 2013 [employer].  

## 2012-10-14 LAB — TYPE AND SCREEN
Antibody Screen: NEGATIVE
Unit division: 0

## 2012-10-20 ENCOUNTER — Other Ambulatory Visit (HOSPITAL_BASED_OUTPATIENT_CLINIC_OR_DEPARTMENT_OTHER): Payer: Medicare Other | Admitting: Lab

## 2012-10-20 ENCOUNTER — Ambulatory Visit (HOSPITAL_BASED_OUTPATIENT_CLINIC_OR_DEPARTMENT_OTHER): Payer: Medicare Other

## 2012-10-20 VITALS — BP 149/46 | HR 61 | Temp 98.6°F

## 2012-10-20 DIAGNOSIS — D649 Anemia, unspecified: Secondary | ICD-10-CM

## 2012-10-20 DIAGNOSIS — D46C Myelodysplastic syndrome with isolated del(5q) chromosomal abnormality: Secondary | ICD-10-CM

## 2012-10-20 LAB — CBC WITH DIFFERENTIAL/PLATELET
Basophils Absolute: 0.1 10*3/uL (ref 0.0–0.1)
EOS%: 3.9 % (ref 0.0–7.0)
Eosinophils Absolute: 0.2 10*3/uL (ref 0.0–0.5)
HGB: 9.9 g/dL — ABNORMAL LOW (ref 13.0–17.1)
MCH: 31.9 pg (ref 27.2–33.4)
NEUT#: 2.6 10*3/uL (ref 1.5–6.5)
RBC: 3.09 10*6/uL — ABNORMAL LOW (ref 4.20–5.82)
RDW: 28.1 % — ABNORMAL HIGH (ref 11.0–14.6)
lymph#: 1.3 10*3/uL (ref 0.9–3.3)

## 2012-10-20 LAB — HOLD TUBE, BLOOD BANK

## 2012-10-20 MED ORDER — EPOETIN ALFA 20000 UNIT/ML IJ SOLN
60000.0000 [IU] | Freq: Once | INTRAMUSCULAR | Status: AC
  Administered 2012-10-20: 60000 [IU] via SUBCUTANEOUS
  Filled 2012-10-20: qty 3

## 2012-10-27 ENCOUNTER — Ambulatory Visit (HOSPITAL_BASED_OUTPATIENT_CLINIC_OR_DEPARTMENT_OTHER): Payer: Medicare Other

## 2012-10-27 ENCOUNTER — Other Ambulatory Visit (HOSPITAL_BASED_OUTPATIENT_CLINIC_OR_DEPARTMENT_OTHER): Payer: Medicare Other

## 2012-10-27 VITALS — BP 153/51 | HR 66 | Temp 98.5°F

## 2012-10-27 DIAGNOSIS — D46C Myelodysplastic syndrome with isolated del(5q) chromosomal abnormality: Secondary | ICD-10-CM

## 2012-10-27 DIAGNOSIS — D649 Anemia, unspecified: Secondary | ICD-10-CM

## 2012-10-27 LAB — CBC WITH DIFFERENTIAL/PLATELET
Basophils Absolute: 0.1 10*3/uL (ref 0.0–0.1)
Eosinophils Absolute: 0.2 10*3/uL (ref 0.0–0.5)
HCT: 28.9 % — ABNORMAL LOW (ref 38.4–49.9)
HGB: 9.6 g/dL — ABNORMAL LOW (ref 13.0–17.1)
MONO#: 0.7 10*3/uL (ref 0.1–0.9)
NEUT#: 2.7 10*3/uL (ref 1.5–6.5)
RDW: 29.5 % — ABNORMAL HIGH (ref 11.0–14.6)
lymph#: 1.7 10*3/uL (ref 0.9–3.3)

## 2012-10-27 LAB — HOLD TUBE, BLOOD BANK

## 2012-10-27 MED ORDER — EPOETIN ALFA 20000 UNIT/ML IJ SOLN
60000.0000 [IU] | Freq: Once | INTRAMUSCULAR | Status: AC
  Administered 2012-10-27: 60000 [IU] via SUBCUTANEOUS
  Filled 2012-10-27: qty 3

## 2012-10-30 ENCOUNTER — Encounter: Payer: Self-pay | Admitting: Internal Medicine

## 2012-10-30 ENCOUNTER — Non-Acute Institutional Stay (SKILLED_NURSING_FACILITY): Payer: Medicare Other | Admitting: Internal Medicine

## 2012-10-30 DIAGNOSIS — IMO0002 Reserved for concepts with insufficient information to code with codable children: Secondary | ICD-10-CM

## 2012-10-30 DIAGNOSIS — E119 Type 2 diabetes mellitus without complications: Secondary | ICD-10-CM

## 2012-10-30 DIAGNOSIS — I509 Heart failure, unspecified: Secondary | ICD-10-CM

## 2012-10-30 DIAGNOSIS — E785 Hyperlipidemia, unspecified: Secondary | ICD-10-CM

## 2012-10-30 DIAGNOSIS — I5031 Acute diastolic (congestive) heart failure: Secondary | ICD-10-CM

## 2012-10-30 DIAGNOSIS — D46C Myelodysplastic syndrome with isolated del(5q) chromosomal abnormality: Secondary | ICD-10-CM

## 2012-10-30 DIAGNOSIS — S32059S Unspecified fracture of fifth lumbar vertebra, sequela: Secondary | ICD-10-CM

## 2012-10-30 DIAGNOSIS — D649 Anemia, unspecified: Secondary | ICD-10-CM

## 2012-10-30 DIAGNOSIS — I10 Essential (primary) hypertension: Secondary | ICD-10-CM

## 2012-10-30 NOTE — Progress Notes (Signed)
Date: 12/20/2012  MRN:  [id number] Name:  Aaron Bruce Sex:  male Age:  79 y.o. DOB:02-02-1925   PSC #:                       Facility/Room:  [facility] Level Of Care:  SNF Provider: Carita Pian, CMD  Emergency Contacts: Contact Information   Name Relation Home Work Mobile   Kiana Spouse [phone]     Scott,Brenda Daughter [phone]       Code Status: full code  Allergies:No Known Allergies   Chief Complaint  Patient presents with  . New Evaluation    new admission s/p transfer from [facility]   HPI:  79 yo male with h/o myelodysplastic syndrome with 5q deletion and anemia requiring transfusions, DMII, UTIs, L5 vertebral fx, hypertension, hyperlipidemia, PVD, sick sinus syndrome and acute on chronic diastolic chf was admitted here for long term care from [facility].  He follows with Dr. Myrle Sheng for procrit injections for his severe macrocytic anemia.  He was last transfused early last month.    Past Medical History  Diagnosis Date  . Anemia   . Hypertension   . Diabetes mellitus   . Stroke   . Hyperlipemia   . Peripheral vascular disease   . Arthritis     Osteoarthritis  . Irritable bowel syndrome (IBS)   . Myelodysplasia with 5q deletion   . Thrombocytopenia     stable--due to Revlimid    Past Surgical History  Procedure Laterality Date  . Tonsillectomy  1948  . Kidney stone surgery  1950's    PCP:  Dr. Darci Needle  Current Outpatient Prescriptions  Medication Sig Dispense Refill  . amLODipine (NORVASC) 10 MG tablet Take 10 mg by mouth daily.       Marland Kitchen aspirin 325 MG tablet Take 1 tablet (325 mg total) by mouth daily.      . beta carotene w/minerals (OCUVITE) tablet Take 1 tablet by mouth daily.      Marland Kitchen ezetimibe-simvastatin (VYTORIN) 10-40 MG per tablet Take 1 tablet by mouth at bedtime.      . ferrous sulfate 325 (65 FE) MG tablet Take 325 mg by mouth 2 (two) times daily with a meal.      . FLUoxetine (PROZAC) 10 MG  capsule Take 10 mg by mouth daily.      Marland Kitchen HYDROcodone-acetaminophen (NORCO/VICODIN) 5-325 MG per tablet Take 1 tablet by mouth every 4 (four) hours as needed.  30 tablet  0  . insulin aspart (NOVOLOG) 100 UNIT/ML injection Inject 0-9 Units into the skin 3 (three) times daily with meals.  1 vial    . insulin detemir (LEVEMIR) 100 UNIT/ML injection Inject 18 Units into the skin at bedtime.      Marland Kitchen MYRBETRIQ 50 MG TB24 Take 50 mg by mouth Daily.      . Teriparatide, Recombinant, (FORTEO [place]) Inject 20 mcg into the skin daily.       . vitamin B-12 1000 MCG tablet Take 1 tablet (1,000 mcg total) by mouth daily.       No current facility-administered medications for this visit.    There is no immunization history on file for this patient.  History  Substance Use Topics  . Smoking status: Never Smoker   . Smokeless tobacco: Not on file  . Alcohol Use: No    No family history on file.   Review of Systems  Constitutional: Positive for malaise/fatigue.  Why he's here  Eyes: Negative for blurred vision.  Cardiovascular: Negative for chest pain.  Gastrointestinal: Negative for abdominal pain.  Genitourinary: Positive for frequency.       Urinary incontinence  Musculoskeletal: Positive for back pain.       Osteoporosis on forteo  Skin: Negative for rash.  Neurological: Positive for weakness. Negative for headaches.  Endo/Heme/Allergies: Bruises/bleeds easily.       Myelodysplastic syndrome, macrocytic anemia, DMII  Psychiatric/Behavioral: Positive for depression. Negative for memory loss.    Vital signs: BP 122/54  Pulse 58  Temp(Src) 98.2 F (36.8 C)  Resp 20  Ht 5\' 5"  (1.651 m)  Wt 165 lb (74.844 kg)  BMI 27.46 kg/m2 Physical Exam  Constitutional: He is oriented to person, place, and time. No distress.  HENT:  Head: Normocephalic and atraumatic.  Right Ear: External ear normal.  Left Ear: External ear normal.  Nose: Nose normal.  Mouth/Throat: Oropharynx is clear and  moist. No oropharyngeal exudate.  Eyes: EOM are normal. Pupils are equal, round, and reactive to light. No scleral icterus.  Neck: Normal range of motion. No JVD present. No thyromegaly present.  Cardiovascular: Normal rate, regular rhythm, normal heart sounds and intact distal pulses.   Pulmonary/Chest: Effort normal and breath sounds normal.  Abdominal: Soft. Bowel sounds are normal. He exhibits no distension and no mass. There is no tenderness.  Musculoskeletal: Normal range of motion.  Neurological: He is alert and oriented to person, place, and time.  Skin: Skin is warm and dry.  Psychiatric:  Flat affect    Plan: MDS (myelodysplastic syndrome) with 5q deletion Continue f/u with Dr. Myrle Sheng for procrit and transfusions as needed.  Monitor cbc per his recommendations.  Anemia requiring transfusions Last transfusion early April.  F/u cbc.  Continue bid iron and daily b12.  Also f/u for procrit shots.  DM2 (diabetes mellitus, type 2) hba1c at goal at 5.9.  Continue novolog meal coverage and full asa.  L5 vertebral fracture On forteo for osteoporosis.    HTN (hypertension), benign bp goal <150/90.  Cont norvasc and micardis.  F/u bmp  Hyperlipidemia No recent lipids in epic.  If he becomes long term, we should f/u here with lipid panel.  Continue vytorin.  Acute diastolic CHF (congestive heart failure) Monitor weights for volume overload.  Heart healthy diet.  On asa, statin, bp meds.

## 2012-11-03 ENCOUNTER — Ambulatory Visit (HOSPITAL_BASED_OUTPATIENT_CLINIC_OR_DEPARTMENT_OTHER): Payer: Medicare Other | Admitting: Nurse Practitioner

## 2012-11-03 ENCOUNTER — Other Ambulatory Visit (HOSPITAL_BASED_OUTPATIENT_CLINIC_OR_DEPARTMENT_OTHER): Payer: Medicare Other | Admitting: Lab

## 2012-11-03 ENCOUNTER — Telehealth: Payer: Self-pay | Admitting: Oncology

## 2012-11-03 ENCOUNTER — Ambulatory Visit (HOSPITAL_BASED_OUTPATIENT_CLINIC_OR_DEPARTMENT_OTHER): Payer: Medicare Other

## 2012-11-03 VITALS — BP 145/55 | HR 55 | Temp 97.8°F | Resp 18 | Ht 65.0 in | Wt 165.7 lb

## 2012-11-03 DIAGNOSIS — D46C Myelodysplastic syndrome with isolated del(5q) chromosomal abnormality: Secondary | ICD-10-CM

## 2012-11-03 DIAGNOSIS — D649 Anemia, unspecified: Secondary | ICD-10-CM

## 2012-11-03 DIAGNOSIS — D696 Thrombocytopenia, unspecified: Secondary | ICD-10-CM

## 2012-11-03 LAB — CBC WITH DIFFERENTIAL/PLATELET
Basophils Absolute: 0 10*3/uL (ref 0.0–0.1)
Eosinophils Absolute: 0.2 10*3/uL (ref 0.0–0.5)
HCT: 26.8 % — ABNORMAL LOW (ref 38.4–49.9)
HGB: 8.7 g/dL — ABNORMAL LOW (ref 13.0–17.1)
LYMPH%: 36 % (ref 14.0–49.0)
MCV: 102.7 fL — ABNORMAL HIGH (ref 79.3–98.0)
MONO%: 12.4 % (ref 0.0–14.0)
NEUT#: 2.2 10*3/uL (ref 1.5–6.5)
NEUT%: 46.8 % (ref 39.0–75.0)
Platelets: 138 10*3/uL — ABNORMAL LOW (ref 140–400)
RDW: 25.8 % — ABNORMAL HIGH (ref 11.0–14.6)

## 2012-11-03 MED ORDER — EPOETIN ALFA 20000 UNIT/ML IJ SOLN
60000.0000 [IU] | Freq: Once | INTRAMUSCULAR | Status: AC
  Administered 2012-11-03: 60000 [IU] via SUBCUTANEOUS
  Filled 2012-11-03: qty 3

## 2012-11-03 NOTE — Progress Notes (Signed)
OFFICE PROGRESS NOTE  Interval history:  [name] returns as scheduled. He continues weekly Procrit injections. He was last transfused 10/13/2012 when the hemoglobin returned at 7.2. On 10/20/2012 the hemoglobin returned at 9.9 and on 10/27/2012 at 9.6.  He has no specific complaints. He denies pain. He has a good appetite. He denies bleeding. Overall feels his energy level is good.   Objective: Blood pressure 145/55, pulse 55, temperature 97.8 F (36.6 C), temperature source Oral, resp. rate 18, height 5\' 5"  (1.651 m), weight 165 lb 11.2 oz (75.161 kg).  No thrush or ulceration. Rales at the left lung base. No respiratory distress. Regular cardiac rhythm. Abdomen soft. No hepatomegaly. Extremities without edema.  Lab Results: Lab Results  Component Value Date   WBC 4.6 11/03/2012   HGB 8.7* 11/03/2012   HCT 26.8* 11/03/2012   MCV 102.7* 11/03/2012   PLT 138* 11/03/2012    Chemistry:    Chemistry      Component Value Date/Time   NA 134* 08/24/2012 0144   NA 142 07/28/2012 1507   K 4.2 08/24/2012 0144   K 4.0 07/28/2012 1507   CL 97 08/24/2012 0144   CL 105 07/28/2012 1507   CO2 26 08/24/2012 0144   CO2 27 07/28/2012 1507   BUN 56* 08/24/2012 0144   BUN 18.5 07/28/2012 1507   CREATININE 1.31 08/24/2012 0144   CREATININE 1.1 07/28/2012 1507      Component Value Date/Time   CALCIUM 9.0 08/24/2012 0144   CALCIUM 8.7 07/28/2012 1507   ALKPHOS 85 06/08/2012 1340   ALKPHOS 86 05/02/2011 1519   AST 13 06/08/2012 1340   AST 17 05/02/2011 1519   ALT 17 06/08/2012 1340   ALT 17 05/02/2011 1519   BILITOT 0.62 06/08/2012 1340   BILITOT 0.3 05/02/2011 1519       Studies/Results: No results found.  Medications: I have reviewed the patient's current medications.  Assessment/Plan:  1. 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 He began Revlimid 11/30/2009. Revlimid was placed on hold 12/13/2009 when the absolute neutrophil count returned at 0.5. The Revlimid was resumed at a  dose of 10 mg every other day on 12/21/2009. The anemia improved. Revlimid was decreased to 5 mg every other day 01/2010 due to neutropenia. Revlimid was discontinued following an office visit 02/28/2012 due to progressive anemia and leukopenia. Bone marrow biopsy on 04/16/2012 confirmed changes of myelodysplasia with no increase in blasts (5q-and 9:11 translocation). Revlimid resumed on 05/27/2012. Increased to 5 mg daily on 06/23/2012. Revlimid was discontinued during a hospitalization February 2014 due to continued severe anemia. He began a trial of weekly Procrit on 09/08/2012. 2. Probable cellulitis of the left lower posterior leg 12/13/2009, resolved following treatment with ciprofloxacin.  3. Left lower extremity edema and pain 12/13/2009 with a negative venous Doppler. 4. History of hypertension. 5. Diabetes. 6. History of a cerebrovascular accident. 7. Hyperlipidemia. 8. Peripheral vascular disease. 9. History of neutropenia secondary to Revlimid. 10. Mild thrombocytopenia. Likely secondary to myelodysplasia 11. Status post kyphoplasty L3 and L4 on 07/31/2011 for compression fractures. 12. Neutropenia on labs 05/27/2012. Question secondary to MDS. 13. Mildly elevated creatinine 06/08/2012. 14. Hospitalization 06/16/2013 through 08/24/2012 with dyspnea found to be in acute congestive heart failure related to severe anemia.  Disposition-Mr. Price appears stable. Plan to continue weekly CBCs/Procrit injections for now. He will return for a followup visit in one month.  Plan reviewed with Dr. Truett Perna.  Lonna Cobb ANP/GNP-BC

## 2012-11-03 NOTE — Telephone Encounter (Signed)
gv and printed appt sched and avs for pt for May and June °

## 2012-11-09 ENCOUNTER — Other Ambulatory Visit: Payer: Self-pay | Admitting: *Deleted

## 2012-11-09 DIAGNOSIS — D469 Myelodysplastic syndrome, unspecified: Secondary | ICD-10-CM

## 2012-11-10 ENCOUNTER — Other Ambulatory Visit (HOSPITAL_BASED_OUTPATIENT_CLINIC_OR_DEPARTMENT_OTHER): Payer: Medicare Other | Admitting: Lab

## 2012-11-10 ENCOUNTER — Ambulatory Visit (HOSPITAL_BASED_OUTPATIENT_CLINIC_OR_DEPARTMENT_OTHER): Payer: Medicare Other

## 2012-11-10 VITALS — BP 134/42 | HR 63 | Temp 98.3°F | Resp 20

## 2012-11-10 DIAGNOSIS — D649 Anemia, unspecified: Secondary | ICD-10-CM

## 2012-11-10 DIAGNOSIS — D469 Myelodysplastic syndrome, unspecified: Secondary | ICD-10-CM

## 2012-11-10 DIAGNOSIS — D46C Myelodysplastic syndrome with isolated del(5q) chromosomal abnormality: Secondary | ICD-10-CM

## 2012-11-10 LAB — CBC WITH DIFFERENTIAL/PLATELET
BASO%: 2 % (ref 0.0–2.0)
EOS%: 3.7 % (ref 0.0–7.0)
LYMPH%: 29.8 % (ref 14.0–49.0)
MCHC: 33.3 g/dL (ref 32.0–36.0)
MONO#: 0.7 10*3/uL (ref 0.1–0.9)
Platelets: 159 10*3/uL (ref 140–400)
RBC: 2.27 10*6/uL — ABNORMAL LOW (ref 4.20–5.82)
WBC: 4.9 10*3/uL (ref 4.0–10.3)

## 2012-11-10 MED ORDER — EPOETIN ALFA 20000 UNIT/ML IJ SOLN
60000.0000 [IU] | Freq: Once | INTRAMUSCULAR | Status: AC
  Administered 2012-11-10: 60000 [IU] via SUBCUTANEOUS
  Filled 2012-11-10: qty 3

## 2012-11-17 ENCOUNTER — Ambulatory Visit (HOSPITAL_BASED_OUTPATIENT_CLINIC_OR_DEPARTMENT_OTHER): Payer: Medicare Other

## 2012-11-17 ENCOUNTER — Other Ambulatory Visit (HOSPITAL_BASED_OUTPATIENT_CLINIC_OR_DEPARTMENT_OTHER): Payer: Medicare Other | Admitting: Lab

## 2012-11-17 ENCOUNTER — Other Ambulatory Visit: Payer: Medicare Other | Admitting: Lab

## 2012-11-17 ENCOUNTER — Ambulatory Visit: Payer: Medicare Other

## 2012-11-17 ENCOUNTER — Other Ambulatory Visit: Payer: Self-pay | Admitting: *Deleted

## 2012-11-17 ENCOUNTER — Encounter (HOSPITAL_COMMUNITY)
Admission: RE | Admit: 2012-11-17 | Discharge: 2012-11-17 | Disposition: A | Discharge status: Home / Self Care | Payer: Medicare Other | Source: Ambulatory Visit | Attending: Oncology | Admitting: Oncology

## 2012-11-17 VITALS — BP 133/40 | HR 58 | Temp 98.4°F

## 2012-11-17 VITALS — BP 160/48 | HR 55 | Temp 98.8°F | Resp 18

## 2012-11-17 DIAGNOSIS — D649 Anemia, unspecified: Secondary | ICD-10-CM

## 2012-11-17 DIAGNOSIS — D469 Myelodysplastic syndrome, unspecified: Secondary | ICD-10-CM

## 2012-11-17 DIAGNOSIS — D46C Myelodysplastic syndrome with isolated del(5q) chromosomal abnormality: Secondary | ICD-10-CM

## 2012-11-17 LAB — CBC WITH DIFFERENTIAL/PLATELET
BASO%: 3.1 % — ABNORMAL HIGH (ref 0.0–2.0)
HCT: 20.1 % — ABNORMAL LOW (ref 38.4–49.9)
MCHC: 33.9 g/dL (ref 32.0–36.0)
MONO#: 0.5 10*3/uL (ref 0.1–0.9)
RBC: 1.91 10*6/uL — ABNORMAL LOW (ref 4.20–5.82)
RDW: 28.3 % — ABNORMAL HIGH (ref 11.0–14.6)
WBC: 3 10*3/uL — ABNORMAL LOW (ref 4.0–10.3)
lymph#: 1.2 10*3/uL (ref 0.9–3.3)

## 2012-11-17 LAB — HOLD TUBE, BLOOD BANK

## 2012-11-17 MED ORDER — SODIUM CHLORIDE 0.9 % IV SOLN
250.0000 mL | Freq: Once | INTRAVENOUS | Status: AC
  Administered 2012-11-17: 250 mL via INTRAVENOUS

## 2012-11-17 MED ORDER — EPOETIN ALFA 20000 UNIT/ML IJ SOLN
60000.0000 [IU] | Freq: Once | INTRAMUSCULAR | Status: AC
  Administered 2012-11-17: 60000 [IU] via SUBCUTANEOUS
  Filled 2012-11-17: qty 3

## 2012-11-17 NOTE — Patient Instructions (Addendum)
Blood Transfusion Information WHAT IS A BLOOD TRANSFUSION? A transfusion is the replacement of blood or some of its parts. Blood is made up of multiple cells which provide different functions.  Red blood cells carry oxygen and are used for blood loss replacement.  White blood cells fight against infection.  Platelets control bleeding.  Plasma helps clot blood.  Other blood products are available for specialized needs, such as hemophilia or other clotting disorders. BEFORE THE TRANSFUSION  Who gives blood for transfusions?   You may be able to donate blood to be used at a later date on yourself (autologous donation).  Relatives can be asked to donate blood. This is generally not any safer than if you have received blood from a stranger. The same precautions are taken to ensure safety when a relative's blood is donated.  Healthy volunteers who are fully evaluated to make sure their blood is safe. This is blood bank blood. Transfusion therapy is the safest it has ever been in the practice of medicine. Before blood is taken from a donor, a complete history is taken to make sure that person has no history of diseases nor engages in risky social behavior (examples are intravenous drug use or sexual activity with multiple partners). The donor's travel history is screened to minimize risk of transmitting infections, such as malaria. The donated blood is tested for signs of infectious diseases, such as HIV and hepatitis. The blood is then tested to be sure it is compatible with you in order to minimize the chance of a transfusion reaction. If you or a relative donates blood, this is often done in anticipation of surgery and is not appropriate for emergency situations. It takes many days to process the donated blood. RISKS AND COMPLICATIONS Although transfusion therapy is very safe and saves many lives, the main dangers of transfusion include:   Getting an infectious disease.  Developing a  transfusion reaction. This is an allergic reaction to something in the blood you were given. Every precaution is taken to prevent this. The decision to have a blood transfusion has been considered carefully by your caregiver before blood is given. Blood is not given unless the benefits outweigh the risks. AFTER THE TRANSFUSION  Right after receiving a blood transfusion, you will usually feel much better and more energetic. This is especially true if your red blood cells have gotten low (anemic). The transfusion raises the level of the red blood cells which carry oxygen, and this usually causes an energy increase.  The nurse administering the transfusion will monitor you carefully for complications. HOME CARE INSTRUCTIONS  No special instructions are needed after a transfusion. You may find your energy is better. Speak with your caregiver about any limitations on activity for underlying diseases you may have. SEEK MEDICAL CARE IF:   Your condition is not improving after your transfusion.  You develop redness or irritation at the intravenous (IV) site. SEEK IMMEDIATE MEDICAL CARE IF:  Any of the following symptoms occur over the next 12 hours:  Shaking chills.  You have a temperature by mouth above 102 F (38.9 C), not controlled by medicine.  Chest, back, or muscle pain.  People around you feel you are not acting correctly or are confused.  Shortness of breath or difficulty breathing.  Dizziness and fainting.  You get a rash or develop hives.  You have a decrease in urine output.  Your urine turns a dark color or changes to pink, red, or brown. Any of the following   symptoms occur over the next 10 days:  You have a temperature by mouth above 102 F (38.9 C), not controlled by medicine.  Shortness of breath.  Weakness after normal activity.  The white part of the eye turns yellow (jaundice).  You have a decrease in the amount of urine or are urinating less often.  Your  urine turns a dark color or changes to pink, red, or brown. Document Released: 06/14/2000 Document Revised: 09/09/2011 Document Reviewed: 02/01/2008 [employer] Patient Information 2013 [employer].  

## 2012-11-18 LAB — TYPE AND SCREEN: Antibody Screen: NEGATIVE

## 2012-11-20 ENCOUNTER — Encounter: Payer: Self-pay | Admitting: Oncology

## 2012-11-20 NOTE — Progress Notes (Signed)
Pt is no longer on Revlimid.  I faxed the letter informing the [facility] that pt no longer needs assistance with this drug.

## 2012-11-24 ENCOUNTER — Other Ambulatory Visit (HOSPITAL_BASED_OUTPATIENT_CLINIC_OR_DEPARTMENT_OTHER): Payer: Medicare Other | Admitting: Lab

## 2012-11-24 ENCOUNTER — Ambulatory Visit (HOSPITAL_BASED_OUTPATIENT_CLINIC_OR_DEPARTMENT_OTHER): Payer: Medicare Other

## 2012-11-24 VITALS — BP 131/36 | HR 63 | Temp 99.1°F

## 2012-11-24 DIAGNOSIS — D469 Myelodysplastic syndrome, unspecified: Secondary | ICD-10-CM

## 2012-11-24 DIAGNOSIS — D46C Myelodysplastic syndrome with isolated del(5q) chromosomal abnormality: Secondary | ICD-10-CM

## 2012-11-24 DIAGNOSIS — D649 Anemia, unspecified: Secondary | ICD-10-CM

## 2012-11-24 LAB — CBC WITH DIFFERENTIAL/PLATELET
Basophils Absolute: 0.1 10*3/uL (ref 0.0–0.1)
HCT: 24.2 % — ABNORMAL LOW (ref 38.4–49.9)
HGB: 8.3 g/dL — ABNORMAL LOW (ref 13.0–17.1)
MONO#: 1.4 10*3/uL — ABNORMAL HIGH (ref 0.1–0.9)
NEUT%: 56.7 % (ref 39.0–75.0)
WBC: 6.2 10*3/uL (ref 4.0–10.3)
lymph#: 1.1 10*3/uL (ref 0.9–3.3)

## 2012-11-24 MED ORDER — EPOETIN ALFA 20000 UNIT/ML IJ SOLN
60000.0000 [IU] | Freq: Once | INTRAMUSCULAR | Status: AC
  Administered 2012-11-24: 60000 [IU] via SUBCUTANEOUS
  Filled 2012-11-24: qty 3

## 2012-12-01 ENCOUNTER — Other Ambulatory Visit (HOSPITAL_BASED_OUTPATIENT_CLINIC_OR_DEPARTMENT_OTHER): Payer: Medicare Other

## 2012-12-01 ENCOUNTER — Ambulatory Visit (HOSPITAL_BASED_OUTPATIENT_CLINIC_OR_DEPARTMENT_OTHER): Payer: Medicare Other

## 2012-12-01 ENCOUNTER — Telehealth: Payer: Self-pay | Admitting: Oncology

## 2012-12-01 ENCOUNTER — Encounter (HOSPITAL_COMMUNITY)
Admission: RE | Admit: 2012-12-01 | Discharge: 2012-12-01 | Disposition: A | Discharge status: Home / Self Care | Payer: Medicare Other | Source: Ambulatory Visit | Attending: Oncology | Admitting: Oncology

## 2012-12-01 ENCOUNTER — Ambulatory Visit (HOSPITAL_BASED_OUTPATIENT_CLINIC_OR_DEPARTMENT_OTHER): Payer: Medicare Other | Admitting: Oncology

## 2012-12-01 ENCOUNTER — Other Ambulatory Visit: Payer: Self-pay | Admitting: *Deleted

## 2012-12-01 VITALS — BP 145/53 | HR 54 | Temp 96.7°F | Resp 18 | Ht 65.0 in | Wt 171.3 lb

## 2012-12-01 DIAGNOSIS — D649 Anemia, unspecified: Secondary | ICD-10-CM

## 2012-12-01 DIAGNOSIS — D709 Neutropenia, unspecified: Secondary | ICD-10-CM

## 2012-12-01 DIAGNOSIS — D46C Myelodysplastic syndrome with isolated del(5q) chromosomal abnormality: Secondary | ICD-10-CM

## 2012-12-01 DIAGNOSIS — D696 Thrombocytopenia, unspecified: Secondary | ICD-10-CM

## 2012-12-01 LAB — CBC WITH DIFFERENTIAL/PLATELET
Basophils Absolute: 0.1 10*3/uL (ref 0.0–0.1)
EOS%: 5.5 % (ref 0.0–7.0)
HCT: 21.5 % — ABNORMAL LOW (ref 38.4–49.9)
HGB: 7.3 g/dL — ABNORMAL LOW (ref 13.0–17.1)
MCH: 35.4 pg — ABNORMAL HIGH (ref 27.2–33.4)
MCV: 103.5 fL — ABNORMAL HIGH (ref 79.3–98.0)
MONO%: 16.1 % — ABNORMAL HIGH (ref 0.0–14.0)
NEUT%: 29.2 % — ABNORMAL LOW (ref 39.0–75.0)
Platelets: 150 10*3/uL (ref 140–400)

## 2012-12-01 LAB — PREPARE RBC (CROSSMATCH)

## 2012-12-01 MED ORDER — EPOETIN ALFA 20000 UNIT/ML IJ SOLN
60000.0000 [IU] | Freq: Once | INTRAMUSCULAR | Status: AC
  Administered 2012-12-01: 60000 [IU] via SUBCUTANEOUS
  Filled 2012-12-01: qty 3

## 2012-12-01 NOTE — Progress Notes (Signed)
   [facility]    OFFICE PROGRESS NOTE   INTERVAL HISTORY:   He returns as scheduled. He continues weekly Procrit. No specific complaint today. He reports feeling better after the red cell transfusion 11/17/2012. The hemoglobin returned at 6.8 on 11/17/2012.  Objective:  Vital signs in last 24 hours:  Blood pressure 145/53, pulse 54, temperature 96.7 F (35.9 C), temperature source Oral, resp. rate 18, height 5\' 5"  (1.651 m), weight 171 lb 4.8 oz (77.701 kg).    Resp: Lungs-inspiratory rhonchi at the posterior bases, no respiratory distress Cardio: Regular rate and rhythm, 2/6 systolic murmur GI: No hepatosplenomegaly Vascular: Trace pitting edema at the left greater than right lower leg, no erythema or tenderness   Lab Results:  Lab Results  Component Value Date   WBC 3.6* 12/01/2012   HGB 7.3* 12/01/2012   HCT 21.5* 12/01/2012   MCV 103.5* 12/01/2012   PLT 150 12/01/2012   ANC 1.0    Medications: I have reviewed the patient's current medications.  Assessment/Plan: 1. 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 He began Revlimid 11/30/2009. Revlimid was placed on hold 12/13/2009 when the absolute neutrophil count returned at 0.5. The Revlimid was resumed at a dose of 10 mg every other day on 12/21/2009. The anemia improved. Revlimid was decreased to 5 mg every other day 01/2010 due to neutropenia. Revlimid was discontinued following an office visit 02/28/2012 due to progressive anemia and leukopenia. Bone marrow biopsy on 04/16/2012 confirmed changes of myelodysplasia with no increase in blasts (5q-and 9:11 translocation). Revlimid resumed on 05/27/2012. Increased to 5 mg daily on 06/23/2012. Revlimid was discontinued during a hospitalization February 2014 due to continued severe anemia. He began a trial of weekly Procrit on 09/08/2012. 2. Probable cellulitis of the left lower posterior leg 12/13/2009, resolved following treatment with  ciprofloxacin.  3. Left lower extremity edema and pain 12/13/2009 with a negative venous Doppler. 4. History of hypertension. 5. Diabetes. 6. History of a cerebrovascular accident. 7. Hyperlipidemia. 8. Peripheral vascular disease. 9. History of neutropenia secondary to Revlimid. 10. Mild thrombocytopenia. Likely secondary to myelodysplasia 11. Status post kyphoplasty L3 and L4 on 07/31/2011 for compression fractures. 12. Neutropenia -persistent, likely secondary to myelodysplasia 13. Mildly elevated creatinine 06/08/2012. 14. Hospitalization 06/16/2013 through 08/24/2012 with dyspnea found to be in acute congestive heart failure related to severe anemia.   Disposition:  He continues to have severe anemia despite weekly Procrit. However he was able to go approximately 5 weeks between the 2 most recent red cell transfusions. We will continue weekly Procrit for now. He will be scheduled for a red cell transfusion later this week. [name] will return for an office visit on 01/05/2013.   Thornton Papas, MD  12/01/2012  1:38 PM

## 2012-12-01 NOTE — Telephone Encounter (Signed)
gv and printed appt sched and avs for pt....eamield MW to add tx....will call [facility] to advised on 6.6.14 appt.Marland KitchenMarland KitchenMarland Kitchen

## 2012-12-04 ENCOUNTER — Ambulatory Visit (HOSPITAL_BASED_OUTPATIENT_CLINIC_OR_DEPARTMENT_OTHER): Payer: Medicare Other

## 2012-12-04 VITALS — BP 157/60 | HR 68 | Temp 98.7°F | Resp 20

## 2012-12-04 DIAGNOSIS — D649 Anemia, unspecified: Secondary | ICD-10-CM

## 2012-12-04 MED ORDER — SODIUM CHLORIDE 0.9 % IV SOLN
250.0000 mL | Freq: Once | INTRAVENOUS | Status: AC
  Administered 2012-12-04: 250 mL via INTRAVENOUS

## 2012-12-04 NOTE — Patient Instructions (Signed)
Blood Transfusion  A blood transfusion replaces your blood or some of its parts. Blood is replaced when you have lost blood because of surgery, an accident, or for severe blood conditions like anemia. You can donate blood to be used on yourself if you have a planned surgery. If you lose blood during that surgery, your own blood can be given back to you. Any blood given to you is checked to make sure it matches your blood type. Your temperature, blood pressure, and heart rate (vital signs) will be checked often.  GET HELP RIGHT AWAY IF:   You feel sick to your stomach (nauseous) or throw up (vomit).  You have watery poop (diarrhea).  You have shortness of breath or trouble breathing.  You have blood in your pee (urine) or have dark colored pee.  You have chest pain or tightness.  Your eyes or skin turn yellow (jaundice).  You have a temperature by mouth above 102 F (38.9 C), not controlled by medicine.  You start to shake and have chills.  You develop a a red rash (hives) or feel itchy.  You develop lightheadedness or feel confused.  You develop back, joint, or muscle pain.  You do not feel hungry (lost appetite).  You feel tired, restless, or nervous.  You develop belly (abdominal) cramps. Document Released: 09/13/2008 Document Revised: 09/09/2011 Document Reviewed: 09/13/2008 ExitCare Patient Information 2014 [employer].  

## 2012-12-05 LAB — TYPE AND SCREEN
ABO/RH(D): O POS
Antibody Screen: NEGATIVE
Unit division: 0

## 2012-12-08 ENCOUNTER — Ambulatory Visit (HOSPITAL_BASED_OUTPATIENT_CLINIC_OR_DEPARTMENT_OTHER): Payer: Medicare Other

## 2012-12-08 ENCOUNTER — Ambulatory Visit: Payer: Medicare Other

## 2012-12-08 ENCOUNTER — Other Ambulatory Visit (HOSPITAL_BASED_OUTPATIENT_CLINIC_OR_DEPARTMENT_OTHER): Payer: Medicare Other | Admitting: Lab

## 2012-12-08 ENCOUNTER — Other Ambulatory Visit: Payer: Medicare Other | Admitting: Lab

## 2012-12-08 VITALS — BP 149/44 | HR 62 | Temp 99.0°F

## 2012-12-08 DIAGNOSIS — D649 Anemia, unspecified: Secondary | ICD-10-CM

## 2012-12-08 DIAGNOSIS — D46C Myelodysplastic syndrome with isolated del(5q) chromosomal abnormality: Secondary | ICD-10-CM

## 2012-12-08 LAB — CBC WITH DIFFERENTIAL/PLATELET
Eosinophils Absolute: 0.2 10*3/uL (ref 0.0–0.5)
LYMPH%: 18.2 % (ref 14.0–49.0)
MCHC: 34.1 g/dL (ref 32.0–36.0)
MCV: 103.6 fL — ABNORMAL HIGH (ref 79.3–98.0)
MONO%: 13.1 % (ref 0.0–14.0)
NEUT#: 6.4 10*3/uL (ref 1.5–6.5)
Platelets: 150 10*3/uL (ref 140–400)
RBC: 2.61 10*6/uL — ABNORMAL LOW (ref 4.20–5.82)

## 2012-12-08 MED ORDER — EPOETIN ALFA 20000 UNIT/ML IJ SOLN
60000.0000 [IU] | Freq: Once | INTRAMUSCULAR | Status: AC
  Administered 2012-12-08: 60000 [IU] via SUBCUTANEOUS
  Filled 2012-12-08: qty 3

## 2012-12-14 DIAGNOSIS — M810 Age-related osteoporosis without current pathological fracture: Secondary | ICD-10-CM | POA: Insufficient documentation

## 2012-12-15 ENCOUNTER — Ambulatory Visit: Payer: Medicare Other

## 2012-12-15 ENCOUNTER — Non-Acute Institutional Stay (SKILLED_NURSING_FACILITY): Payer: Medicare Other | Admitting: Adult Health

## 2012-12-15 ENCOUNTER — Other Ambulatory Visit: Payer: Medicare Other | Admitting: Lab

## 2012-12-15 ENCOUNTER — Other Ambulatory Visit (HOSPITAL_BASED_OUTPATIENT_CLINIC_OR_DEPARTMENT_OTHER): Payer: Medicare Other | Admitting: Lab

## 2012-12-15 ENCOUNTER — Ambulatory Visit (HOSPITAL_BASED_OUTPATIENT_CLINIC_OR_DEPARTMENT_OTHER): Payer: Medicare Other

## 2012-12-15 VITALS — BP 145/42 | HR 60 | Temp 99.1°F

## 2012-12-15 DIAGNOSIS — R32 Unspecified urinary incontinence: Secondary | ICD-10-CM

## 2012-12-15 DIAGNOSIS — F32A Depression, unspecified: Secondary | ICD-10-CM

## 2012-12-15 DIAGNOSIS — D46C Myelodysplastic syndrome with isolated del(5q) chromosomal abnormality: Secondary | ICD-10-CM

## 2012-12-15 DIAGNOSIS — D469 Myelodysplastic syndrome, unspecified: Secondary | ICD-10-CM

## 2012-12-15 DIAGNOSIS — R1310 Dysphagia, unspecified: Secondary | ICD-10-CM

## 2012-12-15 DIAGNOSIS — D649 Anemia, unspecified: Secondary | ICD-10-CM

## 2012-12-15 DIAGNOSIS — F329 Major depressive disorder, single episode, unspecified: Secondary | ICD-10-CM

## 2012-12-15 DIAGNOSIS — E785 Hyperlipidemia, unspecified: Secondary | ICD-10-CM

## 2012-12-15 DIAGNOSIS — I10 Essential (primary) hypertension: Secondary | ICD-10-CM

## 2012-12-15 DIAGNOSIS — M810 Age-related osteoporosis without current pathological fracture: Secondary | ICD-10-CM

## 2012-12-15 DIAGNOSIS — E1149 Type 2 diabetes mellitus with other diabetic neurological complication: Secondary | ICD-10-CM

## 2012-12-15 LAB — CBC WITH DIFFERENTIAL/PLATELET
Basophils Absolute: 0.1 10*3/uL (ref 0.0–0.1)
Eosinophils Absolute: 0.2 10*3/uL (ref 0.0–0.5)
HCT: 26.2 % — ABNORMAL LOW (ref 38.4–49.9)
HGB: 9 g/dL — ABNORMAL LOW (ref 13.0–17.1)
LYMPH%: 17.1 % (ref 14.0–49.0)
MCHC: 34.4 g/dL (ref 32.0–36.0)
MONO#: 1.1 10*3/uL — ABNORMAL HIGH (ref 0.1–0.9)
NEUT%: 64.5 % (ref 39.0–75.0)
Platelets: 138 10*3/uL — ABNORMAL LOW (ref 140–400)
WBC: 7.7 10*3/uL (ref 4.0–10.3)
lymph#: 1.3 10*3/uL (ref 0.9–3.3)

## 2012-12-15 MED ORDER — EPOETIN ALFA 20000 UNIT/ML IJ SOLN
60000.0000 [IU] | Freq: Once | INTRAMUSCULAR | Status: AC
  Administered 2012-12-15: 60000 [IU] via SUBCUTANEOUS
  Filled 2012-12-15: qty 3

## 2012-12-20 NOTE — Assessment & Plan Note (Signed)
Continue f/u with Dr. Myrle Sheng for procrit and transfusions as needed.  Monitor cbc per his recommendations.

## 2012-12-20 NOTE — Assessment & Plan Note (Signed)
On forteo for osteoporosis.

## 2012-12-20 NOTE — Assessment & Plan Note (Addendum)
Last transfusion early April.  F/u cbc.  Continue bid iron and daily b12.  Also f/u for procrit shots.

## 2012-12-20 NOTE — Assessment & Plan Note (Signed)
No recent lipids in epic.  If he becomes long term, we should f/u here with lipid panel.  Continue vytorin.

## 2012-12-20 NOTE — Assessment & Plan Note (Signed)
hba1c at goal at 5.9.  Continue novolog meal coverage and full asa.

## 2012-12-20 NOTE — Assessment & Plan Note (Signed)
bp goal <150/90.  Cont norvasc and micardis.  F/u bmp

## 2012-12-20 NOTE — Assessment & Plan Note (Signed)
Monitor weights for volume overload.  Heart healthy diet.  On asa, statin, bp meds.

## 2012-12-22 ENCOUNTER — Other Ambulatory Visit: Payer: Self-pay | Admitting: *Deleted

## 2012-12-22 ENCOUNTER — Other Ambulatory Visit (HOSPITAL_BASED_OUTPATIENT_CLINIC_OR_DEPARTMENT_OTHER): Payer: Medicare Other | Admitting: Lab

## 2012-12-22 ENCOUNTER — Ambulatory Visit (HOSPITAL_BASED_OUTPATIENT_CLINIC_OR_DEPARTMENT_OTHER): Payer: Medicare Other

## 2012-12-22 ENCOUNTER — Ambulatory Visit: Payer: Medicare Other

## 2012-12-22 ENCOUNTER — Other Ambulatory Visit: Payer: Medicare Other | Admitting: Lab

## 2012-12-22 VITALS — BP 151/43 | HR 62 | Temp 98.6°F

## 2012-12-22 DIAGNOSIS — D649 Anemia, unspecified: Secondary | ICD-10-CM

## 2012-12-22 DIAGNOSIS — D46C Myelodysplastic syndrome with isolated del(5q) chromosomal abnormality: Secondary | ICD-10-CM

## 2012-12-22 LAB — CBC WITH DIFFERENTIAL/PLATELET
BASO%: 2.3 % — ABNORMAL HIGH (ref 0.0–2.0)
Basophils Absolute: 0.1 10*3/uL (ref 0.0–0.1)
EOS%: 4.4 % (ref 0.0–7.0)
HCT: 23.8 % — ABNORMAL LOW (ref 38.4–49.9)
HGB: 8.1 g/dL — ABNORMAL LOW (ref 13.0–17.1)
LYMPH%: 32.7 % (ref 14.0–49.0)
MCH: 36.1 pg — ABNORMAL HIGH (ref 27.2–33.4)
MCHC: 33.9 g/dL (ref 32.0–36.0)
MCV: 106.4 fL — ABNORMAL HIGH (ref 79.3–98.0)
MONO%: 13.3 % (ref 0.0–14.0)
NEUT%: 47.3 % (ref 39.0–75.0)
Platelets: 140 10*3/uL (ref 140–400)
lymph#: 1.6 10*3/uL (ref 0.9–3.3)

## 2012-12-22 MED ORDER — EPOETIN ALFA 20000 UNIT/ML IJ SOLN
60000.0000 [IU] | Freq: Once | INTRAMUSCULAR | Status: AC
  Administered 2012-12-22: 60000 [IU] via SUBCUTANEOUS
  Filled 2012-12-22: qty 3

## 2012-12-22 NOTE — Patient Instructions (Addendum)
Epoetin Alfa injection What is this medicine? EPOETIN ALFA (e POE e tin AL fa) helps your body make more red blood cells. This medicine is used to treat anemia caused by chronic kidney failure, cancer chemotherapy, or HIV-therapy. It may also be used before surgery if you have anemia. This medicine may be used for other purposes; ask your health care provider or pharmacist if you have questions. What should I tell my health care provider before I take this medicine? They need to know if you have any of these conditions: -blood clotting disorders -cancer patient not on chemotherapy -cystic fibrosis -heart disease, such as angina or heart failure -hemoglobin level of 12 g/dL or greater -high blood pressure -low levels of folate, iron, or vitamin B12 -seizures -an unusual or allergic reaction to erythropoietin, albumin, benzyl alcohol, hamster proteins, other medicines, foods, dyes, or preservatives -pregnant or trying to get pregnant -breast-feeding How should I use this medicine? This medicine is for injection into a vein or under the skin. It is usually given by a health care professional in a hospital or clinic setting. If you get this medicine at home, you will be taught how to prepare and give this medicine. Use exactly as directed. Take your medicine at regular intervals. Do not take your medicine more often than directed. It is important that you put your used needles and syringes in a special sharps container. Do not put them in a trash can. If you do not have a sharps container, call your pharmacist or healthcare provider to get one. Talk to your pediatrician regarding the use of this medicine in children. While this drug may be prescribed for selected conditions, precautions do apply. Overdosage: If you think you have taken too much of this medicine contact a poison control center or emergency room at once. NOTE: This medicine is only for you. Do not share this medicine with  others. What if I miss a dose? If you miss a dose, take it as soon as you can. If it is almost time for your next dose, take only that dose. Do not take double or extra doses. What may interact with this medicine? Do not take this medicine with any of the following medications: -darbepoetin alfa This list may not describe all possible interactions. Give your health care provider a list of all the medicines, herbs, non-prescription drugs, or dietary supplements you use. Also tell them if you smoke, drink alcohol, or use illegal drugs. Some items may interact with your medicine. What should I watch for while using this medicine? Visit your prescriber or health care professional for regular checks on your progress and for the needed blood tests and blood pressure measurements. It is especially important for the doctor to make sure your hemoglobin level is in the desired range, to limit the risk of potential side effects and to give you the best benefit. Keep all appointments for any recommended tests. Check your blood pressure as directed. Ask your doctor what your blood pressure should be and when you should contact him or her. As your body makes more red blood cells, you may need to take iron, folic acid, or vitamin B supplements. Ask your doctor or health care provider which products are right for you. If you have kidney disease continue dietary restrictions, even though this medication can make you feel better. Talk with your doctor or health care professional about the foods you eat and the vitamins that you take. What side effects may I notice   from receiving this medicine? Side effects that you should report to your doctor or health care professional as soon as possible: -allergic reactions like skin rash, itching or hives, swelling of the face, lips, or tongue -breathing problems -changes in vision -chest pain -confusion, trouble speaking or understanding -feeling faint or lightheaded,  falls -high blood pressure -muscle aches or pains -pain, swelling, warmth in the leg -rapid weight gain -severe headaches -sudden numbness or weakness of the face, arm or leg -trouble walking, dizziness, loss of balance or coordination -seizures (convulsions) -swelling of the ankles, feet, hands -unusually weak or tired Side effects that usually do not require medical attention (report to your doctor or health care professional if they continue or are bothersome): -diarrhea -fever, chills (flu-like symptoms) -headaches -nausea, vomiting -redness, stinging, or swelling at site where injected This list may not describe all possible side effects. Call your doctor for medical advice about side effects. You may report side effects to FDA at 1-800-FDA-1088. Where should I keep my medicine? Keep out of the reach of children. Store in a refrigerator between 2 and 8 degrees C (36 and 46 degrees F). Do not freeze or shake. Throw away any unused portion if using a single-dose vial. Multi-dose vials can be kept in the refrigerator for up to 21 days after the initial dose. Throw away unused medicine. NOTE: This sheet is a summary. It may not cover all possible information. If you have questions about this medicine, talk to your doctor, pharmacist, or health care provider.  2013, [employer]. (05/31/2008 10:25:44 AM)  

## 2012-12-22 NOTE — Progress Notes (Signed)
Pt without complaints today.  Will return next week for further labs and injection.

## 2012-12-29 ENCOUNTER — Encounter (HOSPITAL_COMMUNITY)
Admission: RE | Admit: 2012-12-29 | Discharge: 2012-12-29 | Disposition: A | Discharge status: Home / Self Care | Payer: Medicare Other | Source: Ambulatory Visit | Attending: Oncology | Admitting: Oncology

## 2012-12-29 ENCOUNTER — Ambulatory Visit (HOSPITAL_BASED_OUTPATIENT_CLINIC_OR_DEPARTMENT_OTHER): Payer: Medicare Other

## 2012-12-29 ENCOUNTER — Telehealth: Payer: Self-pay | Admitting: *Deleted

## 2012-12-29 ENCOUNTER — Other Ambulatory Visit (HOSPITAL_BASED_OUTPATIENT_CLINIC_OR_DEPARTMENT_OTHER): Payer: Medicare Other | Admitting: Lab

## 2012-12-29 ENCOUNTER — Other Ambulatory Visit: Payer: Self-pay | Admitting: *Deleted

## 2012-12-29 VITALS — BP 164/43 | HR 54 | Temp 98.7°F | Resp 18

## 2012-12-29 VITALS — BP 157/45 | HR 63 | Temp 99.4°F | Resp 20

## 2012-12-29 DIAGNOSIS — D46C Myelodysplastic syndrome with isolated del(5q) chromosomal abnormality: Secondary | ICD-10-CM

## 2012-12-29 DIAGNOSIS — D469 Myelodysplastic syndrome, unspecified: Secondary | ICD-10-CM | POA: Insufficient documentation

## 2012-12-29 DIAGNOSIS — D649 Anemia, unspecified: Secondary | ICD-10-CM

## 2012-12-29 LAB — CBC WITH DIFFERENTIAL/PLATELET
BASO%: 1.3 % (ref 0.0–2.0)
Basophils Absolute: 0.1 10e3/uL (ref 0.0–0.1)
EOS%: 2.5 % (ref 0.0–7.0)
Eosinophils Absolute: 0.2 10e3/uL (ref 0.0–0.5)
HCT: 21.4 % — ABNORMAL LOW (ref 38.4–49.9)
HGB: 7.3 g/dL — ABNORMAL LOW (ref 13.0–17.1)
LYMPH%: 21 % (ref 14.0–49.0)
MCH: 36.4 pg — ABNORMAL HIGH (ref 27.2–33.4)
MCHC: 34.2 g/dL (ref 32.0–36.0)
MCV: 106.5 fL — ABNORMAL HIGH (ref 79.3–98.0)
MONO#: 0.9 10e3/uL (ref 0.1–0.9)
MONO%: 12.4 % (ref 0.0–14.0)
NEUT#: 4.8 10e3/uL (ref 1.5–6.5)
NEUT%: 62.8 % (ref 39.0–75.0)
Platelets: 145 10e3/uL (ref 140–400)
RBC: 2.01 10e6/uL — ABNORMAL LOW (ref 4.20–5.82)
RDW: 25.7 % — ABNORMAL HIGH (ref 11.0–14.6)
WBC: 7.6 10e3/uL (ref 4.0–10.3)
lymph#: 1.6 10e3/uL (ref 0.9–3.3)

## 2012-12-29 LAB — HOLD TUBE, BLOOD BANK

## 2012-12-29 LAB — PREPARE RBC (CROSSMATCH)

## 2012-12-29 MED ORDER — EPOETIN ALFA 20000 UNIT/ML IJ SOLN
60000.0000 [IU] | Freq: Once | INTRAMUSCULAR | Status: AC
  Administered 2012-12-29: 60000 [IU] via SUBCUTANEOUS
  Filled 2012-12-29: qty 3

## 2012-12-29 MED ORDER — SODIUM CHLORIDE 0.9 % IV SOLN
250.0000 mL | Freq: Once | INTRAVENOUS | Status: AC
  Administered 2012-12-29: 250 mL via INTRAVENOUS

## 2012-12-29 NOTE — Patient Instructions (Addendum)
Blood Transfusion  A blood transfusion replaces your blood or some of its parts. Blood is replaced when you have lost blood because of surgery, an accident, or for severe blood conditions like anemia. You can donate blood to be used on yourself if you have a planned surgery. If you lose blood during that surgery, your own blood can be given back to you. Any blood given to you is checked to make sure it matches your blood type. Your temperature, blood pressure, and heart rate (vital signs) will be checked often.  GET HELP RIGHT AWAY IF:   You feel sick to your stomach (nauseous) or throw up (vomit).  You have watery poop (diarrhea).  You have shortness of breath or trouble breathing.  You have blood in your pee (urine) or have dark colored pee.  You have chest pain or tightness.  Your eyes or skin turn yellow (jaundice).  You have a temperature by mouth above 102 F (38.9 C), not controlled by medicine.  You start to shake and have chills.  You develop a a red rash (hives) or feel itchy.  You develop lightheadedness or feel confused.  You develop back, joint, or muscle pain.  You do not feel hungry (lost appetite).  You feel tired, restless, or nervous.  You develop belly (abdominal) cramps. Document Released: 09/13/2008 Document Revised: 09/09/2011 Document Reviewed: 09/13/2008 ExitCare Patient Information 2014 [employer].  

## 2012-12-30 ENCOUNTER — Other Ambulatory Visit: Payer: Self-pay | Admitting: *Deleted

## 2012-12-30 LAB — TYPE AND SCREEN
ABO/RH(D): O POS
Antibody Screen: NEGATIVE
Unit division: 0

## 2012-12-30 NOTE — Telephone Encounter (Signed)
Late entry for 12/29/12. Message from pt's daughter requesting new MyChart activation code. Left message on voicemail informing her pt was given one while in the office on 12/29/12.

## 2013-01-05 ENCOUNTER — Ambulatory Visit (HOSPITAL_BASED_OUTPATIENT_CLINIC_OR_DEPARTMENT_OTHER): Payer: Medicare Other

## 2013-01-05 ENCOUNTER — Ambulatory Visit (HOSPITAL_BASED_OUTPATIENT_CLINIC_OR_DEPARTMENT_OTHER): Payer: Medicare Other | Admitting: Nurse Practitioner

## 2013-01-05 ENCOUNTER — Other Ambulatory Visit (HOSPITAL_BASED_OUTPATIENT_CLINIC_OR_DEPARTMENT_OTHER): Payer: Medicare Other | Admitting: Lab

## 2013-01-05 ENCOUNTER — Telehealth: Payer: Self-pay | Admitting: Oncology

## 2013-01-05 VITALS — BP 142/59 | HR 65 | Temp 100.0°F | Resp 17 | Ht 65.0 in | Wt 179.4 lb

## 2013-01-05 DIAGNOSIS — D46C Myelodysplastic syndrome with isolated del(5q) chromosomal abnormality: Secondary | ICD-10-CM

## 2013-01-05 DIAGNOSIS — D649 Anemia, unspecified: Secondary | ICD-10-CM

## 2013-01-05 LAB — CBC WITH DIFFERENTIAL/PLATELET
Basophils Absolute: 0.3 10*3/uL — ABNORMAL HIGH (ref 0.0–0.1)
EOS%: 2.8 % (ref 0.0–7.0)
HGB: 8.3 g/dL — ABNORMAL LOW (ref 13.0–17.1)
MCH: 34.2 pg — ABNORMAL HIGH (ref 27.2–33.4)
NEUT#: 4.9 10*3/uL (ref 1.5–6.5)
RBC: 2.44 10*6/uL — ABNORMAL LOW (ref 4.20–5.82)
RDW: 25.2 % — ABNORMAL HIGH (ref 11.0–14.6)
lymph#: 1.4 10*3/uL (ref 0.9–3.3)

## 2013-01-05 MED ORDER — EPOETIN ALFA 20000 UNIT/ML IJ SOLN
60000.0000 [IU] | Freq: Once | INTRAMUSCULAR | Status: AC
  Administered 2013-01-05: 60000 [IU] via SUBCUTANEOUS
  Filled 2013-01-05: qty 3

## 2013-01-05 NOTE — Progress Notes (Signed)
OFFICE PROGRESS NOTE  Interval history:  [name] returns as scheduled. He continues weekly Procrit injections. He was last transfused on 12/29/2012 when the hemoglobin returned at 7.3. He notes no improvement in his overall condition since the blood transfusion. He denies shortness of breath. No bleeding. He has a good appetite.   Objective: Blood pressure 142/59, pulse 65, temperature 100 F (37.8 C), temperature source Oral, resp. rate 17, height 5\' 5"  (1.651 m), weight 179 lb 6.4 oz (81.375 kg).  No thrush or ulceration. Rales at the left lung base. No respiratory distress. Regular cardiac rhythm. Abdomen soft. No organomegaly. Trace pitting edema at the lower legs bilaterally.  Lab Results: Lab Results  Component Value Date   WBC 8.1 01/05/2013   HGB 8.3* 01/05/2013   HCT 24.6* 01/05/2013   MCV 100.9* 01/05/2013   PLT 140 01/05/2013    Chemistry:    Chemistry      Component Value Date/Time   NA 134* 08/24/2012 0144   NA 142 07/28/2012 1507   K 4.2 08/24/2012 0144   K 4.0 07/28/2012 1507   CL 97 08/24/2012 0144   CL 105 07/28/2012 1507   CO2 26 08/24/2012 0144   CO2 27 07/28/2012 1507   BUN 56* 08/24/2012 0144   BUN 18.5 07/28/2012 1507   CREATININE 1.31 08/24/2012 0144   CREATININE 1.1 07/28/2012 1507      Component Value Date/Time   CALCIUM 9.0 08/24/2012 0144   CALCIUM 8.7 07/28/2012 1507   ALKPHOS 85 06/08/2012 1340   ALKPHOS 86 05/02/2011 1519   AST 13 06/08/2012 1340   AST 17 05/02/2011 1519   ALT 17 06/08/2012 1340   ALT 17 05/02/2011 1519   BILITOT 0.62 06/08/2012 1340   BILITOT 0.3 05/02/2011 1519       Studies/Results: No results found.  Medications: I have reviewed the patient's current medications.  Assessment/Plan:  1. 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 He began Revlimid 11/30/2009. Revlimid was placed on hold 12/13/2009 when the absolute neutrophil count returned at 0.5. The Revlimid was resumed at a dose of 10 mg every other day on  12/21/2009. The anemia improved. Revlimid was decreased to 5 mg every other day 01/2010 due to neutropenia. Revlimid was discontinued following an office visit 02/28/2012 due to progressive anemia and leukopenia. Bone marrow biopsy on 04/16/2012 confirmed changes of myelodysplasia with no increase in blasts (5q-and 9:11 translocation). Revlimid resumed on 05/27/2012. Increased to 5 mg daily on 06/23/2012. Revlimid was discontinued during a hospitalization February 2014 due to continued severe anemia. He began a trial of weekly Procrit on 09/08/2012. 2. Probable cellulitis of the left lower posterior leg 12/13/2009, resolved following treatment with ciprofloxacin.  3. Left lower extremity edema and pain 12/13/2009 with a negative venous Doppler. 4. History of hypertension. 5. Diabetes. 6. History of a cerebrovascular accident. 7. Hyperlipidemia. 8. Peripheral vascular disease. 9. History of neutropenia secondary to Revlimid. 10. Mild thrombocytopenia. Likely secondary to myelodysplasia. 11. Status post kyphoplasty L3 and L4 on 07/31/2011 for compression fractures. 12. Neutropenia -persistent, likely secondary to myelodysplasia 13. Mildly elevated creatinine 06/08/2012. 14. Hospitalization 06/16/2013 through 08/24/2012 with dyspnea found to be in acute congestive heart failure related to severe anemia.  Disposition-Aaron Bruce continues to require periodic transfusion support. Most recent interval between transfusions was approximately 4 weeks. Plan to continue weekly Procrit injections for now. He will return for a followup visit on 02/09/2013.  Lonna Cobb ANP/GNP-BC

## 2013-01-05 NOTE — Telephone Encounter (Signed)
gv pt appt schedule for July and August.  °

## 2013-01-12 ENCOUNTER — Ambulatory Visit (HOSPITAL_BASED_OUTPATIENT_CLINIC_OR_DEPARTMENT_OTHER): Payer: Medicare Other

## 2013-01-12 ENCOUNTER — Other Ambulatory Visit (HOSPITAL_BASED_OUTPATIENT_CLINIC_OR_DEPARTMENT_OTHER): Payer: Medicare Other | Admitting: Lab

## 2013-01-12 ENCOUNTER — Other Ambulatory Visit: Payer: Self-pay | Admitting: *Deleted

## 2013-01-12 ENCOUNTER — Ambulatory Visit: Payer: Medicare Other | Admitting: Lab

## 2013-01-12 VITALS — BP 158/40 | HR 66 | Temp 99.7°F

## 2013-01-12 VITALS — BP 162/66 | HR 56 | Temp 98.7°F | Resp 20

## 2013-01-12 DIAGNOSIS — D649 Anemia, unspecified: Secondary | ICD-10-CM

## 2013-01-12 DIAGNOSIS — D46C Myelodysplastic syndrome with isolated del(5q) chromosomal abnormality: Secondary | ICD-10-CM

## 2013-01-12 LAB — CBC WITH DIFFERENTIAL/PLATELET
Basophils Absolute: 0.1 10*3/uL (ref 0.0–0.1)
Eosinophils Absolute: 0.2 10*3/uL (ref 0.0–0.5)
HGB: 6.6 g/dL — CL (ref 13.0–17.1)
MCV: 100.5 fL — ABNORMAL HIGH (ref 79.3–98.0)
MONO#: 0.8 10*3/uL (ref 0.1–0.9)
MONO%: 12.9 % (ref 0.0–14.0)
NEUT#: 3.3 10*3/uL (ref 1.5–6.5)
RDW: 22.3 % — ABNORMAL HIGH (ref 11.0–14.6)
WBC: 6.2 10*3/uL (ref 4.0–10.3)
lymph#: 1.9 10*3/uL (ref 0.9–3.3)
nRBC: 0 % (ref 0–0)

## 2013-01-12 LAB — PREPARE RBC (CROSSMATCH)

## 2013-01-12 MED ORDER — EPOETIN ALFA 20000 UNIT/ML IJ SOLN
60000.0000 [IU] | Freq: Once | INTRAMUSCULAR | Status: AC
  Administered 2013-01-12: 60000 [IU] via SUBCUTANEOUS
  Filled 2013-01-12: qty 3

## 2013-01-12 MED ORDER — SODIUM CHLORIDE 0.9 % IV SOLN
250.0000 mL | Freq: Once | INTRAVENOUS | Status: AC
  Administered 2013-01-12: 250 mL via INTRAVENOUS

## 2013-01-12 NOTE — Patient Instructions (Signed)
Blood Transfusion  A blood transfusion replaces your blood or some of its parts. Blood is replaced when you have lost blood because of surgery, an accident, or for severe blood conditions like anemia. You can donate blood to be used on yourself if you have a planned surgery. If you lose blood during that surgery, your own blood can be given back to you. Any blood given to you is checked to make sure it matches your blood type. Your temperature, blood pressure, and heart rate (vital signs) will be checked often.  GET HELP RIGHT AWAY IF:   You feel sick to your stomach (nauseous) or throw up (vomit).  You have watery poop (diarrhea).  You have shortness of breath or trouble breathing.  You have blood in your pee (urine) or have dark colored pee.  You have chest pain or tightness.  Your eyes or skin turn yellow (jaundice).  You have a temperature by mouth above 102 F (38.9 C), not controlled by medicine.  You start to shake and have chills.  You develop a a red rash (hives) or feel itchy.  You develop lightheadedness or feel confused.  You develop back, joint, or muscle pain.  You do not feel hungry (lost appetite).  You feel tired, restless, or nervous.  You develop belly (abdominal) cramps. Document Released: 09/13/2008 Document Revised: 09/09/2011 Document Reviewed: 09/13/2008 ExitCare Patient Information 2014 [employer].  

## 2013-01-13 LAB — TYPE AND SCREEN
ABO/RH(D): O POS
Antibody Screen: NEGATIVE
Unit division: 0

## 2013-01-19 ENCOUNTER — Ambulatory Visit (HOSPITAL_BASED_OUTPATIENT_CLINIC_OR_DEPARTMENT_OTHER): Payer: Medicare Other

## 2013-01-19 ENCOUNTER — Other Ambulatory Visit (HOSPITAL_BASED_OUTPATIENT_CLINIC_OR_DEPARTMENT_OTHER): Payer: Medicare Other | Admitting: Lab

## 2013-01-19 ENCOUNTER — Other Ambulatory Visit: Payer: Self-pay | Admitting: Nurse Practitioner

## 2013-01-19 VITALS — BP 153/46 | HR 62 | Temp 98.8°F

## 2013-01-19 DIAGNOSIS — D649 Anemia, unspecified: Secondary | ICD-10-CM

## 2013-01-19 DIAGNOSIS — D46C Myelodysplastic syndrome with isolated del(5q) chromosomal abnormality: Secondary | ICD-10-CM

## 2013-01-19 LAB — SAMPLE TO BLOOD BANK

## 2013-01-19 LAB — CBC WITH DIFFERENTIAL/PLATELET
BASO%: 2.3 % — ABNORMAL HIGH (ref 0.0–2.0)
Basophils Absolute: 0.2 10*3/uL — ABNORMAL HIGH (ref 0.0–0.1)
EOS%: 3 % (ref 0.0–7.0)
MCH: 32 pg (ref 27.2–33.4)
MCHC: 33.2 g/dL (ref 32.0–36.0)
MCV: 96.5 fL (ref 79.3–98.0)
MONO%: 14.2 % — ABNORMAL HIGH (ref 0.0–14.0)
NEUT%: 61 % (ref 39.0–75.0)
RDW: 24.2 % — ABNORMAL HIGH (ref 11.0–14.6)
lymph#: 1.6 10*3/uL (ref 0.9–3.3)

## 2013-01-19 MED ORDER — EPOETIN ALFA 20000 UNIT/ML IJ SOLN
60000.0000 [IU] | Freq: Once | INTRAMUSCULAR | Status: AC
  Administered 2013-01-19: 60000 [IU] via SUBCUTANEOUS
  Filled 2013-01-19: qty 3

## 2013-01-25 ENCOUNTER — Other Ambulatory Visit: Payer: Self-pay | Admitting: *Deleted

## 2013-01-25 DIAGNOSIS — D46C Myelodysplastic syndrome with isolated del(5q) chromosomal abnormality: Secondary | ICD-10-CM

## 2013-01-26 ENCOUNTER — Other Ambulatory Visit: Payer: Self-pay | Admitting: Oncology

## 2013-01-26 ENCOUNTER — Ambulatory Visit: Payer: Medicare Other

## 2013-01-26 ENCOUNTER — Other Ambulatory Visit: Payer: Self-pay | Admitting: *Deleted

## 2013-01-26 ENCOUNTER — Ambulatory Visit (HOSPITAL_BASED_OUTPATIENT_CLINIC_OR_DEPARTMENT_OTHER): Payer: Medicare Other | Admitting: Nurse Practitioner

## 2013-01-26 ENCOUNTER — Ambulatory Visit (HOSPITAL_BASED_OUTPATIENT_CLINIC_OR_DEPARTMENT_OTHER): Payer: Medicare Other

## 2013-01-26 ENCOUNTER — Other Ambulatory Visit (HOSPITAL_BASED_OUTPATIENT_CLINIC_OR_DEPARTMENT_OTHER): Payer: Medicare Other

## 2013-01-26 VITALS — BP 140/40 | HR 60 | Temp 98.9°F

## 2013-01-26 VITALS — BP 154/45 | HR 58 | Temp 98.6°F | Resp 24

## 2013-01-26 DIAGNOSIS — D649 Anemia, unspecified: Secondary | ICD-10-CM

## 2013-01-26 DIAGNOSIS — D46C Myelodysplastic syndrome with isolated del(5q) chromosomal abnormality: Secondary | ICD-10-CM

## 2013-01-26 DIAGNOSIS — D469 Myelodysplastic syndrome, unspecified: Secondary | ICD-10-CM

## 2013-01-26 DIAGNOSIS — I509 Heart failure, unspecified: Secondary | ICD-10-CM

## 2013-01-26 DIAGNOSIS — R0989 Other specified symptoms and signs involving the circulatory and respiratory systems: Secondary | ICD-10-CM

## 2013-01-26 LAB — CBC WITH DIFFERENTIAL/PLATELET
Eosinophils Absolute: 0.2 10*3/uL (ref 0.0–0.5)
HCT: 20 % — ABNORMAL LOW (ref 38.4–49.9)
HGB: 6.4 g/dL — CL (ref 13.0–17.1)
LYMPH%: 29.2 % (ref 14.0–49.0)
MCH: 31.1 pg (ref 27.2–33.4)
MCHC: 32 g/dL (ref 32.0–36.0)
MCV: 97.1 fL (ref 79.3–98.0)
MONO#: 0.6 10*3/uL (ref 0.1–0.9)
lymph#: 1.6 10*3/uL (ref 0.9–3.3)

## 2013-01-26 LAB — HOLD TUBE, BLOOD BANK

## 2013-01-26 MED ORDER — EPOETIN ALFA 20000 UNIT/ML IJ SOLN
60000.0000 [IU] | Freq: Once | INTRAMUSCULAR | Status: AC
  Administered 2013-01-26: 60000 [IU] via SUBCUTANEOUS
  Filled 2013-01-26: qty 3

## 2013-01-26 MED ORDER — FUROSEMIDE 10 MG/ML IJ SOLN
20.0000 mg | Freq: Once | INTRAMUSCULAR | Status: AC
  Administered 2013-01-26: 20 mg via INTRAVENOUS

## 2013-01-26 MED ORDER — FUROSEMIDE 10 MG/ML IJ SOLN: 20.0000 mg | Freq: Once | INTRAMUSCULAR | Status: DC

## 2013-01-26 MED ORDER — SODIUM CHLORIDE 0.9 % IV SOLN
250.0000 mL | Freq: Once | INTRAVENOUS | Status: AC
  Administered 2013-01-26: 250 mL via INTRAVENOUS

## 2013-01-26 NOTE — Patient Instructions (Signed)
Blood Transfusion Information WHAT IS A BLOOD TRANSFUSION? A transfusion is the replacement of blood or some of its parts. Blood is made up of multiple cells which provide different functions.  Red blood cells carry oxygen and are used for blood loss replacement.  White blood cells fight against infection.  Platelets control bleeding.  Plasma helps clot blood.  Other blood products are available for specialized needs, such as hemophilia or other clotting disorders. BEFORE THE TRANSFUSION  Who gives blood for transfusions?   You may be able to donate blood to be used at a later date on yourself (autologous donation).  Relatives can be asked to donate blood. This is generally not any safer than if you have received blood from a stranger. The same precautions are taken to ensure safety when a relative's blood is donated.  Healthy volunteers who are fully evaluated to make sure their blood is safe. This is blood bank blood. Transfusion therapy is the safest it has ever been in the practice of medicine. Before blood is taken from a donor, a complete history is taken to make sure that person has no history of diseases nor engages in risky social behavior (examples are intravenous drug use or sexual activity with multiple partners). The donor's travel history is screened to minimize risk of transmitting infections, such as malaria. The donated blood is tested for signs of infectious diseases, such as HIV and hepatitis. The blood is then tested to be sure it is compatible with you in order to minimize the chance of a transfusion reaction. If you or a relative donates blood, this is often done in anticipation of surgery and is not appropriate for emergency situations. It takes many days to process the donated blood. RISKS AND COMPLICATIONS Although transfusion therapy is very safe and saves many lives, the main dangers of transfusion include:   Getting an infectious disease.  Developing a  transfusion reaction. This is an allergic reaction to something in the blood you were given. Every precaution is taken to prevent this. The decision to have a blood transfusion has been considered carefully by your caregiver before blood is given. Blood is not given unless the benefits outweigh the risks. AFTER THE TRANSFUSION  Right after receiving a blood transfusion, you will usually feel much better and more energetic. This is especially true if your red blood cells have gotten low (anemic). The transfusion raises the level of the red blood cells which carry oxygen, and this usually causes an energy increase.  The nurse administering the transfusion will monitor you carefully for complications. HOME CARE INSTRUCTIONS  No special instructions are needed after a transfusion. You may find your energy is better. Speak with your caregiver about any limitations on activity for underlying diseases you may have. SEEK MEDICAL CARE IF:   Your condition is not improving after your transfusion.  You develop redness or irritation at the intravenous (IV) site. SEEK IMMEDIATE MEDICAL CARE IF:  Any of the following symptoms occur over the next 12 hours:  Shaking chills.  You have a temperature by mouth above 102 F (38.9 C), not controlled by medicine.  Chest, back, or muscle pain.  People around you feel you are not acting correctly or are confused.  Shortness of breath or difficulty breathing.  Dizziness and fainting.  You get a rash or develop hives.  You have a decrease in urine output.  Your urine turns a dark color or changes to pink, red, or brown. Any of the following   symptoms occur over the next 10 days:  You have a temperature by mouth above 102 F (38.9 C), not controlled by medicine.  Shortness of breath.  Weakness after normal activity.  The white part of the eye turns yellow (jaundice).  You have a decrease in the amount of urine or are urinating less often.  Your  urine turns a dark color or changes to pink, red, or brown. Document Released: 06/14/2000 Document Revised: 09/09/2011 Document Reviewed: 02/01/2008 Exit[employer] Patient Information 2014 [employer].  

## 2013-01-26 NOTE — Progress Notes (Signed)
1615 Patient with noticeable wheezing (after eating), started coughing during meal, which seemed to exacerbate the wheezing. O2 Sats @ 95-96%. VSS B/P within his normal. Lonna Cobb, NP @ chairside. Blood infusion rate reduced to 128ml/hr.

## 2013-01-26 NOTE — Progress Notes (Addendum)
OFFICE PROGRESS NOTE  Interval history:  Aaron Bruce is an 79 year old man with MDS. He receives weekly Procrit injections and intermittent red cell transfusion support. He presented to the office today for a routine Procrit injection. The hemoglobin returned at 6.4. Arrangements were made for him to receive 1 unit of packed red cells today and one tomorrow.  The nurse in the infusion area requested evaluation of dyspnea.  At present he is approximately halfway through one unit of blood. He states his shortness of breath began several months ago. He typically sleeps on 1 pillow. This has not increased recently. He has noted no change in leg swelling. He denies chest pain or pressure.   Objective: There were no vitals taken for this visit.  Respiratory rate is increased. Question neck vein distention. Lungs clear. Regular cardiac rhythm. No S3. Abdomen is soft. 2-3+ pitting edema at the lower legs bilaterally.  Lab Results: Lab Results  Component Value Date   WBC 5.4 01/26/2013   HGB 6.4* 01/26/2013   HCT 20.0* 01/26/2013   MCV 97.1 01/26/2013   PLT 174 01/26/2013    Chemistry:    Chemistry      Component Value Date/Time   NA 134* 08/24/2012 0144   NA 142 07/28/2012 1507   K 4.2 08/24/2012 0144   K 4.0 07/28/2012 1507   CL 97 08/24/2012 0144   CL 105 07/28/2012 1507   CO2 26 08/24/2012 0144   CO2 27 07/28/2012 1507   BUN 56* 08/24/2012 0144   BUN 18.5 07/28/2012 1507   CREATININE 1.31 08/24/2012 0144   CREATININE 1.1 07/28/2012 1507      Component Value Date/Time   CALCIUM 9.0 08/24/2012 0144   CALCIUM 8.7 07/28/2012 1507   ALKPHOS 85 06/08/2012 1340   ALKPHOS 86 05/02/2011 1519   AST 13 06/08/2012 1340   AST 17 05/02/2011 1519   ALT 17 06/08/2012 1340   ALT 17 05/02/2011 1519   BILITOT 0.62 06/08/2012 1340   BILITOT 0.3 05/02/2011 1519       Studies/Results: No results found.  Medications: I have reviewed the patient's current medications.  Assessment/Plan:  1. MDS currently  receiving weekly Procrit injections and intermittent red cell transfusion support. 2. Severe anemia secondary to #1. 3. Hospitalization 08/17/2012 through 08/24/2012 with dyspnea, found to be in acute congestive heart failure related to severe anemia.  Disposition-Aaron Bruce became more short of breath coinciding with transfusion of one unit of packed red cells in the office today. We will proceed with the transfusion as planned. Upon completion of the unit of blood he will receive Lasix 20 mg IV. He is scheduled to return for a second unit of blood on 01/27/2013. We will obtain a pro-BNP and chemistry panel when he returns on 01/27/2013.   He appears stable to return to the nursing facility following today's transfusion. We will reevaluate when he returns tomorrow. He was instructed to notify the nursing facility staff if his breathing worsens overnight.  Patient seen with Dr. Cyndie Chime.  Lonna Cobb ANP/GNP-BC   Hematology oncology attending: I personally interviewed and examined this patient.  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 Hemoglobin today 6.4. Nurses noted he was dyspneic at rest. He has a history of chronic congestive heart failure with a number of hospitalizations for the same. Most recent admission in February of this year. He did appear mildly dyspneic at rest. However, his lungs  are clear. Regular cardiac rhythm with no gallop. There is jugular venous distention. He has one-2+ peripheral edema. He denied any PND or orthopnea. He denied any chest pain or pressure. I elected to proceed with his transfusion since I think is definitely contributing to his dyspnea. His baseline hemoglobin doesn't run much higher than 8 g but at 6.5 he is under physiologic stress. I ordered Lasix 20 mg IV to follow the transfusion. He will be reevaluated tomorrow when he will get  another unit of blood.  I think that he needs followup with his internist or cardiologist for further management of his congestive heart failure. Medication list does not include a diuretic. I will give him another dose of IV Lasix after blood tomorrow. Will check a BNP when he returns.  Cephas Darby, MD, FACP  Hematology-Oncology

## 2013-01-26 NOTE — Progress Notes (Signed)
Spoke with caregiver at [facility] to let them know [name] became short of breath with his transfusion today requiring IV Lasix.  Requested they monitor him closely upon his return to the facility this evening.

## 2013-01-27 ENCOUNTER — Other Ambulatory Visit: Payer: Self-pay | Admitting: Nurse Practitioner

## 2013-01-27 ENCOUNTER — Ambulatory Visit (HOSPITAL_BASED_OUTPATIENT_CLINIC_OR_DEPARTMENT_OTHER): Payer: Medicare Other | Admitting: Lab

## 2013-01-27 ENCOUNTER — Ambulatory Visit (HOSPITAL_BASED_OUTPATIENT_CLINIC_OR_DEPARTMENT_OTHER): Payer: Medicare Other

## 2013-01-27 ENCOUNTER — Other Ambulatory Visit: Payer: Self-pay | Admitting: *Deleted

## 2013-01-27 VITALS — BP 183/69 | HR 55 | Temp 98.2°F | Resp 21

## 2013-01-27 DIAGNOSIS — I509 Heart failure, unspecified: Secondary | ICD-10-CM

## 2013-01-27 DIAGNOSIS — I5031 Acute diastolic (congestive) heart failure: Secondary | ICD-10-CM

## 2013-01-27 DIAGNOSIS — D46C Myelodysplastic syndrome with isolated del(5q) chromosomal abnormality: Secondary | ICD-10-CM

## 2013-01-27 DIAGNOSIS — D469 Myelodysplastic syndrome, unspecified: Secondary | ICD-10-CM

## 2013-01-27 DIAGNOSIS — D649 Anemia, unspecified: Secondary | ICD-10-CM

## 2013-01-27 LAB — CBC WITH DIFFERENTIAL/PLATELET
Basophils Absolute: 0.1 10*3/uL (ref 0.0–0.1)
Eosinophils Absolute: 0.3 10*3/uL (ref 0.0–0.5)
HGB: 7.1 g/dL — ABNORMAL LOW (ref 13.0–17.1)
LYMPH%: 22.7 % (ref 14.0–49.0)
MCV: 95.8 fL (ref 79.3–98.0)
MONO%: 17.1 % — ABNORMAL HIGH (ref 0.0–14.0)
NEUT#: 2.8 10*3/uL (ref 1.5–6.5)
Platelets: 157 10*3/uL (ref 140–400)
RDW: 23.4 % — ABNORMAL HIGH (ref 11.0–14.6)

## 2013-01-27 LAB — COMPREHENSIVE METABOLIC PANEL (CC13)
Albumin: 3.3 g/dL — ABNORMAL LOW (ref 3.5–5.0)
Alkaline Phosphatase: 67 U/L (ref 40–150)
BUN: 48.5 mg/dL — ABNORMAL HIGH (ref 7.0–26.0)
CO2: 23 mEq/L (ref 22–29)
Glucose: 202 mg/dl — ABNORMAL HIGH (ref 70–140)
Potassium: 5.1 mEq/L (ref 3.5–5.1)

## 2013-01-27 LAB — PRO B NATRIURETIC PEPTIDE: Pro B Natriuretic peptide (BNP): 4580 pg/mL — ABNORMAL HIGH (ref <451)

## 2013-01-27 MED ORDER — SODIUM CHLORIDE 0.9 % IV SOLN
250.0000 mL | Freq: Once | INTRAVENOUS | Status: AC
  Administered 2013-01-27: 250 mL via INTRAVENOUS

## 2013-01-27 MED ORDER — FUROSEMIDE 10 MG/ML IJ SOLN
20.0000 mg | Freq: Once | INTRAMUSCULAR | Status: AC
  Administered 2013-01-27: 20 mg via INTRAVENOUS

## 2013-01-27 NOTE — Patient Instructions (Addendum)
Blood Transfusion Information WHAT IS A BLOOD TRANSFUSION? A transfusion is the replacement of blood or some of its parts. Blood is made up of multiple cells which provide different functions.  Red blood cells carry oxygen and are used for blood loss replacement.  White blood cells fight against infection.  Platelets control bleeding.  Plasma helps clot blood.  Other blood products are available for specialized needs, such as hemophilia or other clotting disorders. BEFORE THE TRANSFUSION  Who gives blood for transfusions?   You may be able to donate blood to be used at a later date on yourself (autologous donation).  Relatives can be asked to donate blood. This is generally not any safer than if you have received blood from a stranger. The same precautions are taken to ensure safety when a relative's blood is donated.  Healthy volunteers who are fully evaluated to make sure their blood is safe. This is blood bank blood. Transfusion therapy is the safest it has ever been in the practice of medicine. Before blood is taken from a donor, a complete history is taken to make sure that person has no history of diseases nor engages in risky social behavior (examples are intravenous drug use or sexual activity with multiple partners). The donor's travel history is screened to minimize risk of transmitting infections, such as malaria. The donated blood is tested for signs of infectious diseases, such as HIV and hepatitis. The blood is then tested to be sure it is compatible with you in order to minimize the chance of a transfusion reaction. If you or a relative donates blood, this is often done in anticipation of surgery and is not appropriate for emergency situations. It takes many days to process the donated blood. RISKS AND COMPLICATIONS Although transfusion therapy is very safe and saves many lives, the main dangers of transfusion include:   Getting an infectious disease.  Developing a  transfusion reaction. This is an allergic reaction to something in the blood you were given. Every precaution is taken to prevent this. The decision to have a blood transfusion has been considered carefully by your caregiver before blood is given. Blood is not given unless the benefits outweigh the risks. AFTER THE TRANSFUSION  Right after receiving a blood transfusion, you will usually feel much better and more energetic. This is especially true if your red blood cells have gotten low (anemic). The transfusion raises the level of the red blood cells which carry oxygen, and this usually causes an energy increase.  The nurse administering the transfusion will monitor you carefully for complications. HOME CARE INSTRUCTIONS  No special instructions are needed after a transfusion. You may find your energy is better. Speak with your caregiver about any limitations on activity for underlying diseases you may have. SEEK MEDICAL CARE IF:   Your condition is not improving after your transfusion.  You develop redness or irritation at the intravenous (IV) site. SEEK IMMEDIATE MEDICAL CARE IF:  Any of the following symptoms occur over the next 12 hours:  Shaking chills.  You have a temperature by mouth above 102 F (38.9 C), not controlled by medicine.  Chest, back, or muscle pain.  People around you feel you are not acting correctly or are confused.  Shortness of breath or difficulty breathing.  Dizziness and fainting.  You get a rash or develop hives.  You have a decrease in urine output.  Your urine turns a dark color or changes to pink, red, or brown. Any of the following   symptoms occur over the next 10 days:  You have a temperature by mouth above 102 F (38.9 C), not controlled by medicine.  Shortness of breath.  Weakness after normal activity.  The white part of the eye turns yellow (jaundice).  You have a decrease in the amount of urine or are urinating less often.  Your  urine turns a dark color or changes to pink, red, or brown. Document Released: 06/14/2000 Document Revised: 09/09/2011 Document Reviewed: 02/01/2008 Exit[employer] Patient Information 2014 [employer].  

## 2013-01-28 ENCOUNTER — Encounter: Payer: Self-pay | Admitting: Nurse Practitioner

## 2013-01-28 ENCOUNTER — Telehealth: Payer: Self-pay | Admitting: Nurse Practitioner

## 2013-01-28 DIAGNOSIS — I509 Heart failure, unspecified: Secondary | ICD-10-CM | POA: Insufficient documentation

## 2013-01-28 HISTORY — DX: Heart failure, unspecified: I50.9

## 2013-01-28 LAB — TYPE AND SCREEN
ABO/RH(D): O POS
Unit division: 0

## 2013-01-28 NOTE — Telephone Encounter (Signed)
Spoke with nurse Diannia Ruder at [facility] to request a provider evaluate [name] this week at the facility to determine if he needs to be on chronic diuretic therapy. Notified nurse that he was given 20 mg of Lasix IV following each unit of blood this week. She will request a provider round on him this week.

## 2013-02-02 ENCOUNTER — Ambulatory Visit: Payer: Medicare Other | Admitting: Lab

## 2013-02-02 ENCOUNTER — Telehealth: Payer: Self-pay | Admitting: *Deleted

## 2013-02-02 ENCOUNTER — Encounter (HOSPITAL_COMMUNITY)
Admission: RE | Admit: 2013-02-02 | Discharge: 2013-02-02 | Disposition: A | Discharge status: Home / Self Care | Payer: Medicare Other | Source: Ambulatory Visit | Attending: Oncology | Admitting: Oncology

## 2013-02-02 ENCOUNTER — Other Ambulatory Visit: Payer: Self-pay | Admitting: *Deleted

## 2013-02-02 ENCOUNTER — Other Ambulatory Visit (HOSPITAL_BASED_OUTPATIENT_CLINIC_OR_DEPARTMENT_OTHER): Payer: Medicare Other | Admitting: Lab

## 2013-02-02 ENCOUNTER — Ambulatory Visit (HOSPITAL_BASED_OUTPATIENT_CLINIC_OR_DEPARTMENT_OTHER): Payer: Medicare Other

## 2013-02-02 VITALS — BP 138/40 | HR 59 | Temp 97.9°F

## 2013-02-02 DIAGNOSIS — D46C Myelodysplastic syndrome with isolated del(5q) chromosomal abnormality: Secondary | ICD-10-CM

## 2013-02-02 DIAGNOSIS — D649 Anemia, unspecified: Secondary | ICD-10-CM

## 2013-02-02 DIAGNOSIS — R0602 Shortness of breath: Secondary | ICD-10-CM

## 2013-02-02 DIAGNOSIS — I5031 Acute diastolic (congestive) heart failure: Secondary | ICD-10-CM

## 2013-02-02 DIAGNOSIS — I509 Heart failure, unspecified: Secondary | ICD-10-CM

## 2013-02-02 LAB — BASIC METABOLIC PANEL
CO2: 26 mEq/L (ref 19–32)
Chloride: 101 mEq/L (ref 96–112)
Creatinine, Ser: 1.33 mg/dL (ref 0.50–1.35)

## 2013-02-02 LAB — CBC WITH DIFFERENTIAL/PLATELET
BASO%: 2.7 % — ABNORMAL HIGH (ref 0.0–2.0)
HCT: 22.3 % — ABNORMAL LOW (ref 38.4–49.9)
HGB: 7.5 g/dL — ABNORMAL LOW (ref 13.0–17.1)
MCHC: 33.4 g/dL (ref 32.0–36.0)
MONO#: 0.9 10*3/uL (ref 0.1–0.9)
NEUT%: 47.5 % (ref 39.0–75.0)
WBC: 6 10*3/uL (ref 4.0–10.3)
lymph#: 1.8 10*3/uL (ref 0.9–3.3)

## 2013-02-02 MED ORDER — EPOETIN ALFA 20000 UNIT/ML IJ SOLN
60000.0000 [IU] | Freq: Once | INTRAMUSCULAR | Status: AC
  Administered 2013-02-02: 60000 [IU] via SUBCUTANEOUS
  Filled 2013-02-02: qty 3

## 2013-02-02 NOTE — Progress Notes (Signed)
CBC reviewed by Dr. Truett Perna. Order received for blood this week. Pt is scheduled for follow up next week. Called for HAR. Pt worked in for transfusion 8/8.

## 2013-02-02 NOTE — Telephone Encounter (Signed)
Eve, RN from [facility] requesting additional lab drawn at [facility] d/t unable to drawn at their facility.  Requested BMP, BNP and to fax results to [phone].

## 2013-02-03 ENCOUNTER — Encounter: Payer: Self-pay | Admitting: *Deleted

## 2013-02-03 NOTE — Progress Notes (Unsigned)
Faxed Bmet results to [facility] [phone] with note BNP will follow later today.

## 2013-02-05 ENCOUNTER — Encounter (HOSPITAL_COMMUNITY): Payer: Self-pay | Admitting: *Deleted

## 2013-02-05 ENCOUNTER — Ambulatory Visit (HOSPITAL_BASED_OUTPATIENT_CLINIC_OR_DEPARTMENT_OTHER): Payer: Medicare Other | Admitting: Oncology

## 2013-02-05 ENCOUNTER — Other Ambulatory Visit: Payer: Self-pay

## 2013-02-05 ENCOUNTER — Inpatient Hospital Stay (HOSPITAL_COMMUNITY)
Admission: AD | Admit: 2013-02-05 | Discharge: 2013-02-08 | DRG: 281 | Disposition: A | Discharge status: Skilled Nursing Facility | Payer: Medicare Other | Source: Ambulatory Visit | Attending: Family Medicine | Admitting: Family Medicine

## 2013-02-05 ENCOUNTER — Inpatient Hospital Stay (HOSPITAL_COMMUNITY): Payer: Medicare Other

## 2013-02-05 ENCOUNTER — Ambulatory Visit: Payer: Medicare Other

## 2013-02-05 DIAGNOSIS — I5033 Acute on chronic diastolic (congestive) heart failure: Principal | ICD-10-CM | POA: Diagnosis present

## 2013-02-05 DIAGNOSIS — I495 Sick sinus syndrome: Secondary | ICD-10-CM

## 2013-02-05 DIAGNOSIS — E119 Type 2 diabetes mellitus without complications: Secondary | ICD-10-CM | POA: Diagnosis present

## 2013-02-05 DIAGNOSIS — Z79899 Other long term (current) drug therapy: Secondary | ICD-10-CM

## 2013-02-05 DIAGNOSIS — N179 Acute kidney failure, unspecified: Secondary | ICD-10-CM | POA: Diagnosis present

## 2013-02-05 DIAGNOSIS — I739 Peripheral vascular disease, unspecified: Secondary | ICD-10-CM | POA: Diagnosis present

## 2013-02-05 DIAGNOSIS — D46C Myelodysplastic syndrome with isolated del(5q) chromosomal abnormality: Secondary | ICD-10-CM

## 2013-02-05 DIAGNOSIS — I2489 Other forms of acute ischemic heart disease: Secondary | ICD-10-CM | POA: Diagnosis present

## 2013-02-05 DIAGNOSIS — R748 Abnormal levels of other serum enzymes: Secondary | ICD-10-CM | POA: Diagnosis present

## 2013-02-05 DIAGNOSIS — I5031 Acute diastolic (congestive) heart failure: Secondary | ICD-10-CM | POA: Diagnosis present

## 2013-02-05 DIAGNOSIS — F329 Major depressive disorder, single episode, unspecified: Secondary | ICD-10-CM | POA: Diagnosis present

## 2013-02-05 DIAGNOSIS — R0602 Shortness of breath: Secondary | ICD-10-CM

## 2013-02-05 DIAGNOSIS — I10 Essential (primary) hypertension: Secondary | ICD-10-CM | POA: Diagnosis present

## 2013-02-05 DIAGNOSIS — I214 Non-ST elevation (NSTEMI) myocardial infarction: Secondary | ICD-10-CM | POA: Diagnosis present

## 2013-02-05 DIAGNOSIS — E785 Hyperlipidemia, unspecified: Secondary | ICD-10-CM | POA: Diagnosis present

## 2013-02-05 DIAGNOSIS — D539 Nutritional anemia, unspecified: Secondary | ICD-10-CM | POA: Diagnosis present

## 2013-02-05 DIAGNOSIS — R0600 Dyspnea, unspecified: Secondary | ICD-10-CM

## 2013-02-05 DIAGNOSIS — Z8673 Personal history of transient ischemic attack (TIA), and cerebral infarction without residual deficits: Secondary | ICD-10-CM

## 2013-02-05 DIAGNOSIS — I248 Other forms of acute ischemic heart disease: Secondary | ICD-10-CM | POA: Diagnosis present

## 2013-02-05 DIAGNOSIS — Z794 Long term (current) use of insulin: Secondary | ICD-10-CM

## 2013-02-05 DIAGNOSIS — F3289 Other specified depressive episodes: Secondary | ICD-10-CM | POA: Diagnosis present

## 2013-02-05 DIAGNOSIS — D649 Anemia, unspecified: Secondary | ICD-10-CM

## 2013-02-05 DIAGNOSIS — I498 Other specified cardiac arrhythmias: Secondary | ICD-10-CM | POA: Diagnosis not present

## 2013-02-05 DIAGNOSIS — R7989 Other specified abnormal findings of blood chemistry: Secondary | ICD-10-CM

## 2013-02-05 DIAGNOSIS — I509 Heart failure, unspecified: Secondary | ICD-10-CM

## 2013-02-05 LAB — COMPREHENSIVE METABOLIC PANEL
ALT: 23 U/L (ref 0–53)
AST: 14 U/L (ref 0–37)
Albumin: 3.4 g/dL — ABNORMAL LOW (ref 3.5–5.2)
Calcium: 9 mg/dL (ref 8.4–10.5)
Creatinine, Ser: 1.39 mg/dL — ABNORMAL HIGH (ref 0.50–1.35)
GFR calc non Af Amer: 45 mL/min — ABNORMAL LOW (ref >90)
Sodium: 142 mEq/L (ref 135–145)
Total Protein: 5.9 g/dL — ABNORMAL LOW (ref 6.0–8.3)

## 2013-02-05 LAB — CBC
Hemoglobin: 7 g/dL — ABNORMAL LOW (ref 13.0–17.0)
MCH: 31.8 pg (ref 26.0–34.0)
MCV: 97.7 fL (ref 78.0–100.0)
Platelets: 188 10*3/uL (ref 150–400)
RBC: 2.2 MIL/uL — ABNORMAL LOW (ref 4.22–5.81)
WBC: 7.3 10*3/uL (ref 4.0–10.5)

## 2013-02-05 LAB — TROPONIN I: Troponin I: 0.53 ng/mL (ref <0.30)

## 2013-02-05 LAB — GLUCOSE, CAPILLARY
Glucose-Capillary: 197 mg/dL — ABNORMAL HIGH (ref 70–99)
Glucose-Capillary: 119 mg/dL — ABNORMAL HIGH (ref 70–99)

## 2013-02-05 LAB — CK TOTAL AND CKMB (NOT AT ARMC)
CK, MB: 5.5 ng/mL — ABNORMAL HIGH (ref 0.3–4.0)
Relative Index: INVALID (ref 0.0–2.5)
Total CK: 29 U/L (ref 7–232)

## 2013-02-05 LAB — PREPARE RBC (CROSSMATCH)

## 2013-02-05 MED ORDER — ACETAMINOPHEN 325 MG PO TABS
650.0000 mg | ORAL_TABLET | Freq: Once | ORAL | Status: AC
  Administered 2013-02-05: 650 mg via ORAL
  Filled 2013-02-05: qty 2

## 2013-02-05 MED ORDER — INSULIN DETEMIR 100 UNIT/ML ~~LOC~~ SOLN
18.0000 [IU] | Freq: Every day | SUBCUTANEOUS | Status: DC
  Administered 2013-02-05 – 2013-02-07 (×3): 18 [IU] via SUBCUTANEOUS
  Filled 2013-02-05 (×3): qty 0.18

## 2013-02-05 MED ORDER — INSULIN ASPART 100 UNIT/ML ~~LOC~~ SOLN: 0.0000 [IU] | Freq: Three times a day (TID) | SUBCUTANEOUS | Status: DC

## 2013-02-05 MED ORDER — INSULIN ASPART 100 UNIT/ML ~~LOC~~ SOLN
0.0000 [IU] | Freq: Three times a day (TID) | SUBCUTANEOUS | Status: DC
  Administered 2013-02-05: 2 [IU] via SUBCUTANEOUS
  Administered 2013-02-07 (×2): 1 [IU] via SUBCUTANEOUS
  Administered 2013-02-08: 2 [IU] via SUBCUTANEOUS

## 2013-02-05 MED ORDER — FUROSEMIDE 10 MG/ML IJ SOLN
40.0000 mg | Freq: Two times a day (BID) | INTRAMUSCULAR | Status: DC
  Administered 2013-02-05 – 2013-02-08 (×6): 40 mg via INTRAVENOUS
  Filled 2013-02-05 (×8): qty 4

## 2013-02-05 MED ORDER — FUROSEMIDE 10 MG/ML IJ SOLN
20.0000 mg | Freq: Once | INTRAMUSCULAR | Status: AC
Start: 2013-02-05 — End: 2013-02-05

## 2013-02-05 MED ORDER — AMLODIPINE BESYLATE 10 MG PO TABS
10.0000 mg | ORAL_TABLET | Freq: Every day | ORAL | Status: DC
  Administered 2013-02-06 – 2013-02-08 (×3): 10 mg via ORAL
  Filled 2013-02-05 (×3): qty 1

## 2013-02-05 MED ORDER — FLUOXETINE HCL 10 MG PO CAPS
10.0000 mg | ORAL_CAPSULE | Freq: Every day | ORAL | Status: DC
  Administered 2013-02-06 – 2013-02-08 (×3): 10 mg via ORAL
  Filled 2013-02-05 (×3): qty 1

## 2013-02-05 MED ORDER — TERIPARATIDE (RECOMBINANT) 600 MCG/2.4ML ~~LOC~~ SOLN: 20.0000 ug | Freq: Every day | SUBCUTANEOUS | Status: DC

## 2013-02-05 MED ORDER — HYDROCODONE-ACETAMINOPHEN 5-325 MG PO TABS
1.0000 | ORAL_TABLET | ORAL | Status: DC | PRN
  Administered 2013-02-06: 1 via ORAL
  Filled 2013-02-05: qty 1

## 2013-02-05 MED ORDER — CARVEDILOL 3.125 MG PO TABS
3.1250 mg | ORAL_TABLET | Freq: Two times a day (BID) | ORAL | Status: DC
  Administered 2013-02-06: 3.125 mg via ORAL
  Filled 2013-02-05 (×3): qty 1

## 2013-02-05 MED ORDER — CHLORHEXIDINE GLUCONATE CLOTH 2 % EX PADS
6.0000 | MEDICATED_PAD | Freq: Every day | CUTANEOUS | Status: DC
  Administered 2013-02-06 – 2013-02-08 (×3): 6 via TOPICAL

## 2013-02-05 MED ORDER — FERROUS SULFATE 325 (65 FE) MG PO TABS
325.0000 mg | ORAL_TABLET | Freq: Two times a day (BID) | ORAL | Status: DC
  Administered 2013-02-06 – 2013-02-08 (×5): 325 mg via ORAL
  Filled 2013-02-05 (×7): qty 1

## 2013-02-05 MED ORDER — ASPIRIN 325 MG PO TABS
325.0000 mg | ORAL_TABLET | Freq: Every day | ORAL | Status: DC
  Administered 2013-02-05 – 2013-02-08 (×4): 325 mg via ORAL
  Filled 2013-02-05 (×4): qty 1

## 2013-02-05 MED ORDER — INSULIN ASPART 100 UNIT/ML ~~LOC~~ SOLN
3.0000 [IU] | Freq: Three times a day (TID) | SUBCUTANEOUS | Status: DC
  Administered 2013-02-05 – 2013-02-08 (×7): 3 [IU] via SUBCUTANEOUS

## 2013-02-05 MED ORDER — DIPHENHYDRAMINE HCL 25 MG PO CAPS
25.0000 mg | ORAL_CAPSULE | Freq: Once | ORAL | Status: AC
  Administered 2013-02-05: 25 mg via ORAL
  Filled 2013-02-05: qty 1

## 2013-02-05 MED ORDER — FUROSEMIDE 10 MG/ML IJ SOLN
INTRAMUSCULAR | Status: AC
  Administered 2013-02-05: 20 mg via INTRAVENOUS
  Filled 2013-02-05: qty 4

## 2013-02-05 MED ORDER — VITAMIN B-12 1000 MCG PO TABS
1000.0000 ug | ORAL_TABLET | Freq: Every day | ORAL | Status: DC
  Administered 2013-02-06 – 2013-02-08 (×3): 1000 ug via ORAL
  Filled 2013-02-05 (×3): qty 1

## 2013-02-05 MED ORDER — MUPIROCIN 2 % EX OINT
1.0000 "application " | TOPICAL_OINTMENT | Freq: Two times a day (BID) | CUTANEOUS | Status: DC
  Administered 2013-02-06 – 2013-02-08 (×6): 1 via NASAL
  Filled 2013-02-05: qty 22

## 2013-02-05 MED ORDER — FUROSEMIDE 10 MG/ML IJ SOLN
40.0000 mg | Freq: Once | INTRAMUSCULAR | Status: AC
  Administered 2013-02-05: 40 mg via INTRAVENOUS

## 2013-02-05 NOTE — Progress Notes (Signed)
CRITICAL VALUE ALERT  Critical value received:  Trop 0.53  Date of notification:  02/05/2013  Time of notification:  1602  Critical value read back:yes  Nurse who received alert:  Ernst Breach RN   MD notified (1st page):  Dr. Suzzanne Cloud   Time of first page: 1604  MD notified (2nd page):  Time of second page:  Responding MD:  Suzzanne Cloud  Time MD responded:  [phone]
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
## 2013-02-05 NOTE — Progress Notes (Signed)
Pt. Is wheezing and SOB.  He reports that he had left arm/shoulder pain last night and was given something for pain.  Discussed with Dr. Truett Perna. Dr. Truett Perna in to examine patient.  He will contact hospitalist for direct admit.   Ordered O2 for patient.   O2 administered at 2L via nasal canula at 10:05am. Pt. Transferred at 1350 via wheelchair to 4West -1422.  Report given to nurse - Johnny Bridge.  Family and [facility] notified per Kem Boroughs RN

## 2013-02-05 NOTE — Progress Notes (Signed)
Pt does not use computer. He does not want to sign up for my Chart. Briscoe Burns BSN, RN-BC Admissions RN  02/05/2013 2:30 PM

## 2013-02-05 NOTE — Progress Notes (Signed)
   Aaron Bruce    OFFICE PROGRESS NOTE   INTERVAL HISTORY:   He was seen on 01/26/2013 with severe anemia. He was noted to have dyspnea. He was transfused with packed red blood cells on 01/26/2013 and 01/27/2013. He was prescribed furosemide.  Aaron Bruce was noted to have a hemoglobin of 7.5 on 02/02/2013. He returns for scheduled red cell transfusion today. The nurse in the chemotherapy room noted he had significant dyspnea. He denies fever. He admits to dyspnea.  Objective:  Vital signs in last 24 hours:  There were no vitals taken for this visit.    HEENT: The neck veins are distended Resp: Decreased breath sounds at the lower chest with inspiratory rails bilaterally, increased respiratory rate Cardio: Regular rate and rhythm GI: Nontender, no hepatosplenomegaly Vascular: Pitting edema below the knee bilaterally   Lab Results:  Lab Results  Component Value Date   WBC 7.3 02/05/2013   HGB 7.0* 02/05/2013   HCT 21.5* 02/05/2013   MCV 97.7 02/05/2013   PLT 188 02/05/2013      Medications: I have reviewed the patient's current medications.  Assessment/Plan: 1. 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 He began Revlimid 11/30/2009. Revlimid was placed on hold 12/13/2009 when the absolute neutrophil count returned at 0.5. The Revlimid was resumed at a dose of 10 mg every other day on 12/21/2009. The anemia improved. Revlimid was decreased to 5 mg every other day 01/2010 due to neutropenia. Revlimid was discontinued following an office visit 02/28/2012 due to progressive anemia and leukopenia. Bone marrow biopsy on 04/16/2012 confirmed changes of myelodysplasia with no increase in blasts (5q-and 9:11 translocation). Revlimid resumed on 05/27/2012. Increased to 5 mg daily on 06/23/2012. Revlimid was discontinued during a hospitalization February 2014 due to continued severe anemia. He began a trial of weekly Procrit on 09/08/2012. He continues to  require frequent red cell transfusions. The Procrit will be discontinued. 2. Probable cellulitis of the left lower posterior leg 12/13/2009, resolved following treatment with ciprofloxacin.  3. Left lower extremity edema and pain 12/13/2009 with a negative venous Doppler. 4. History of hypertension. 5. Diabetes. 6. History of a cerebrovascular accident. 7. Hyperlipidemia. 8. Peripheral vascular disease. 9. History of neutropenia secondary to Revlimid. 10. History of Mild thrombocytopenia. Likely secondary to myelodysplasia. 11. Status post kyphoplasty L3 and L4 on 07/31/2011 for compression fractures. 12. Mildly elevated creatinine 06/08/2012. 13. Hospitalization 06/16/2013 through 08/24/2012 with dyspnea found to be in acute congestive heart failure related to severe anemia. 14. Dyspnea and clinical evidence of congestive heart failure 02/05/2013   Disposition:  Aaron Bruce has persistent severe anemia despite receiving red cell transfusions last week. He has dyspnea today and the physical examination is consistent with congestive heart failure. I recommend hospital admission for transfusion support and treatment of the congestive heart failure. He will likely need to be maintained on a chronic diuretic regimen.  We will discontinue Procrit since this has not helped the transfusion-dependent anemia. Treatment options are limited for treatment of his myelodysplasia.  Dr. Elisabeth Pigeon graciously agreed to accept Aaron Bruce for hospital admission. I will check on him 02/07/2013.   Aaron Papas, MD  02/05/2013  4:16 PM

## 2013-02-06 ENCOUNTER — Other Ambulatory Visit: Payer: Self-pay

## 2013-02-06 LAB — CBC
HCT: 23 % — ABNORMAL LOW (ref 39.0–52.0)
Hemoglobin: 7.6 g/dL — ABNORMAL LOW (ref 13.0–17.0)
RBC: 2.4 MIL/uL — ABNORMAL LOW (ref 4.22–5.81)

## 2013-02-06 LAB — COMPREHENSIVE METABOLIC PANEL
ALT: 22 U/L (ref 0–53)
AST: 13 U/L (ref 0–37)
CO2: 29 mEq/L (ref 19–32)
Calcium: 8.9 mg/dL (ref 8.4–10.5)
Creatinine, Ser: 1.56 mg/dL — ABNORMAL HIGH (ref 0.50–1.35)
GFR calc Af Amer: 45 mL/min — ABNORMAL LOW (ref >90)
GFR calc non Af Amer: 39 mL/min — ABNORMAL LOW (ref >90)
Sodium: 139 mEq/L (ref 135–145)
Total Protein: 5.7 g/dL — ABNORMAL LOW (ref 6.0–8.3)

## 2013-02-06 LAB — TROPONIN I: Troponin I: 0.42 ng/mL (ref <0.30)

## 2013-02-06 LAB — TYPE AND SCREEN
ABO/RH(D): O POS
Antibody Screen: NEGATIVE
Unit division: 0

## 2013-02-06 LAB — GLUCOSE, CAPILLARY
Glucose-Capillary: 115 mg/dL — ABNORMAL HIGH (ref 70–99)
Glucose-Capillary: 74 mg/dL (ref 70–99)
Glucose-Capillary: 122 mg/dL — ABNORMAL HIGH (ref 70–99)

## 2013-02-06 MED ORDER — NITROGLYCERIN 0.4 MG/SPRAY TL SOLN
1.0000 | Status: DC | PRN
  Filled 2013-02-06: qty 4.9

## 2013-02-06 MED ORDER — CARVEDILOL 6.25 MG PO TABS
6.2500 mg | ORAL_TABLET | Freq: Two times a day (BID) | ORAL | Status: DC
  Filled 2013-02-06 (×3): qty 1

## 2013-02-06 NOTE — Progress Notes (Signed)
Aaron Bruce WUJ:[id number] DOB: 1924-08-17 DOA: 02/05/2013 PCP: Michiel Sites, MD  Brief narrative: 79 yr old cm admitted with Le swelling, SOB cw potetital CHF in setting of chronic anemia.  Noted to also have some CP.  Troponin's POC came back + and then evolution of EKG changes showed potentitial NSTEMI-cardiology consulted 02/06/13  Past medical history-As per Problem list  Consultants:  Oncology  Cardiology  Procedures:  none  Antibiotics:  none   Subjective  Sleepy from pain meds.  Notes reviewed.  He had CP this am and noted EKG changes No one overnight informed of EKG change   Objective    Interim History: none  Telemetry: Sinus arrythmia c pvc's  Objective: Filed Vitals:   02/05/13 2030 02/05/13 2100 02/06/13 0723 02/06/13 0804  BP: 145/54 183/59 146/66 158/64  Pulse: 54 63 79 88  Temp: 98.5 F (36.9 C) 98.4 F (36.9 C) 98.2 F (36.8 C)   TempSrc: Oral Oral Oral   Resp: 14 16 16 20   Height:      Weight:      SpO2: 97%  100% 92%    Intake/Output Summary (Last 24 hours) at 02/06/13 1017 Last data filed at 02/06/13 0802  Gross per 24 hour  Intake  312.5 ml  Output   1525 ml  Net -1212.5 ml    Exam:  General: sleepy oreitnes to some extent. Cardiovascular: s1 s2 no m/r/g Respiratory: clear, no addedsound Abdomen: soft, NTNd Skin grade 2 LE edema Neuro intact but sleepy  Data Reviewed: Basic Metabolic Panel:  Recent Labs Lab 02/02/13 1647 02/05/13 1518 02/06/13 0446  NA 136 142 139  K 5.4* 4.9 4.6  CL 101 103 103  CO2 26 29 29   GLUCOSE 158* 219* 117*  BUN 50* 50* 58*  CREATININE 1.33 1.39* 1.56*  CALCIUM 9.0 9.0 8.9   Liver Function Tests:  Recent Labs Lab 02/05/13 1518 02/06/13 0446  AST 14 13  ALT 23 22  ALKPHOS 71 61  BILITOT 0.3 0.6  PROT 5.9* 5.7*  ALBUMIN 3.4* 3.4*   No results found for this basename: LIPASE, AMYLASE,  in the last 168 hours No results found for this basename: AMMONIA,  in the last  168 hours CBC:  Recent Labs Lab 02/02/13 1317 02/05/13 1518 02/06/13 0446  WBC 6.0 7.3 6.1  NEUTROABS 2.9  --   --   HGB 7.5* 7.0* 7.6*  HCT 22.3* 21.5* 23.0*  MCV 95.5 97.7 95.8  PLT 158 188 147*   Cardiac Enzymes:  Recent Labs Lab 02/05/13 1518 02/05/13 2217 02/06/13 0446  CKTOTAL 29  --   --   CKMB 5.5*  --   --   TROPONINI 0.53* 0.48* 0.42*   BNP: No components found with this basename: POCBNP,  CBG:  Recent Labs Lab 02/05/13 1548 02/05/13 2319 02/06/13 0744  GLUCAP 197* 119* 115*    Recent Results (from the past 240 hour(s))  MRSA PCR SCREENING     Status: Abnormal   Collection Time    02/05/13  2:48 PM      Result Value Range Status   MRSA by PCR POSITIVE (*) NEGATIVE Final   Comment:            The GeneXpert MRSA Assay (FDA     approved for NASAL specimens     only), is one component of a     comprehensive MRSA colonization     surveillance program. It is not  intended to diagnose MRSA     infection nor to guide or     monitor treatment for     MRSA infections.     RESULT CALLED TO, READ BACK BY AND VERIFIED WITH:     K CAPES RN 2039 02/05/13 A NAVARRO     Studies:              All Imaging reviewed and is as per above notation   Scheduled Meds: . amLODipine  10 mg Oral Daily  . aspirin  325 mg Oral Daily  . carvedilol  3.125 mg Oral BID  . Chlorhexidine Gluconate Cloth  6 each Topical Q0600  . ferrous sulfate  325 mg Oral BID  . FLUoxetine  10 mg Oral Daily  . furosemide  40 mg Intravenous BID  . insulin aspart  0-9 Units Subcutaneous TID WC  . insulin aspart  3 Units Subcutaneous TID WC  . insulin detemir  18 Units Subcutaneous QHS  . mupirocin ointment  1 application Nasal BID  . Teriparatide (Recombinant)  20 mcg Transdermal Daily  . vitamin B-12  1,000 mcg Oral Daily   Continuous Infusions:    Assessment/Plan:   1. Nstemi 2/2 to demand ischemia/Post infarct?-Discussed case with Dr. Anne Fu on call for Cardiology-recommends  to continue current management- I am doubtful very good candidate for heparin or Cath currently.  Will continue Coreg 3.125 bid and ASa 81 daily-he will see in consult and decide on further management 2. Acute exacerbation of diastolic heart failure-probably secondary to high output heart failure from anemia-transfused 1 unit packed red blood cells 02/05/13, repeat CBC a.m. Diuresis 40 mg IV Lasix twice a day-Net -1.21 liters since 8/8 3. AKI-iatrogenic-needs diuresis however.  Monitor creat in am 4. Myelodysplasia-failed Revlimid therapy per oncologist 5. Diabetes mellitus type 2-continue Levemir 18 units each bedtime, sliding scale coverage 6. Hypertension continue amlodipine 10 mg, Coreg 3.121 added at this admission 7. Depression continue Prozac 10 mg daily 8. Hypercalcemia-continue teriparatide 20 mcg daily 9. Symptomatic anemia-continue ferrous sulfate 325, vitamin B12 1000 mcg daily 10. History CVA continue aspirin 81 for now given anemia   Code Status: Full Family Communication: none at bedisde Disposition Plan: inpatient   Pleas Koch, MD  [facility] Pager [phone] 02/06/2013, 10:17 AM    LOS: 1 day

## 2013-02-06 NOTE — Progress Notes (Signed)
Paged Dr. Anne Fu. Waiting for call back to give HR concerns. Will continue to monitor.

## 2013-02-06 NOTE — Consult Note (Addendum)
Admit date: 02/05/2013 Referring Physician  Dr. Mahala Menghini Primary Physician Michiel Sites, MD Primary Cardiologist  Dr. Mayford Knife Reason for Consultation  Elevated troponin  HPI: 79 year old male with myelodysplastic syndrome, severe anemia requiring transfusions with history of diabetes, hypertension, stroke as well as high output heart failure with chronic lower extremity edema, shortness of breath, elevated BNP of approximately 10,000 as well as minimally elevated troponin of 0.5-0.4. Previous BNP in February was 6600.  Earlier this morning he had an episode of chest discomfort (he actually states that it was shoulder and arm pain once again but it is hard for him to remember)  and an EKG was performed at 8:10 AM. His EKG does demonstrate mildly increased heart rate from previous in the 80s to 90s with ST segment depression in the precordial leads V3 through V5. A repeat EKG 2 hours later shows resolution of ST segment changes. He is currently chest pain-free.  Previously complained of left arm pain and shoulder pain and was sent to the hospital for further evaluation. Over the past 3 weeks he has gained approximately 21 pounds and is more short of breath with activity. He has had an episode of acute respiratory failure in the setting of high output heart failure previously in February of 2014.  Has had transient bradycardia overnight. No prolonged episodes.    PMH:   Past Medical History  Diagnosis Date  . Anemia   . Hypertension   . Diabetes mellitus   . Stroke   . Hyperlipemia   . Peripheral vascular disease   . Arthritis     Osteoarthritis  . Irritable bowel syndrome (IBS)   . Myelodysplasia with 5q deletion   . Thrombocytopenia     stable--due to Revlimid  . Chronic CHF 01/28/2013    PSH:   Past Surgical History  Procedure Laterality Date  . Tonsillectomy  1948  . Kidney stone surgery  1950's   Allergies:  Review of patient's allergies indicates no known allergies. Prior  to Admit Meds:   Prescriptions prior to admission  Medication Sig Dispense Refill  . amLODipine (NORVASC) 10 MG tablet Take 10 mg by mouth daily.       Marland Kitchen aspirin 325 MG tablet Take 1 tablet (325 mg total) by mouth daily.      Marland Kitchen ezetimibe-simvastatin (VYTORIN) 10-40 MG per tablet Take 1 tablet by mouth at bedtime.      . ferrous sulfate 325 (65 FE) MG tablet Take 325 mg by mouth 2 (two) times daily.      Marland Kitchen FLUoxetine (PROZAC) 10 MG capsule Take 10 mg by mouth daily.      . furosemide (LASIX) 40 MG tablet Take 40 mg by mouth daily.      Marland Kitchen HYDROcodone-acetaminophen (NORCO/VICODIN) 5-325 MG per tablet Take 1 tablet by mouth every 4 (four) hours as needed.  30 tablet  0  . insulin aspart (NOVOLOG) 100 UNIT/ML injection Inject 0-9 Units into the skin 3 (three) times daily with meals.  1 vial    . insulin detemir (LEVEMIR) 100 UNIT/ML injection Inject 18 Units into the skin at bedtime.      . Multiple Vitamin (MULTIVITAMIN WITH MINERALS) TABS tablet Take 1 tablet by mouth daily.      Marland Kitchen MYRBETRIQ 50 MG TB24 Take 50 mg by mouth Daily.      . Teriparatide, Recombinant, (FORTEO ) Inject 20 mcg into the skin daily.       . vitamin B-12 1000 MCG tablet Take 1 tablet (  1,000 mcg total) by mouth daily.       Fam HX:   History reviewed. No pertinent family history. Social HX:    History   Social History  . Marital Status: Married    Spouse Name: N/A    Number of Children: N/A  . Years of Education: N/A   Occupational History  . Not on file.   Social History Main Topics  . Smoking status: Former Smoker    Quit date: 02/06/1939  . Smokeless tobacco: Never Used  . Alcohol Use: No  . Drug Use: No  . Sexually Active: Not on file   Other Topics Concern  . Not on file   Social History Narrative  . No narrative on file     ROS:  Denies any strokelike symptoms, bleeding, dysphagia All 11 ROS were addressed and are negative except what is stated in the HPI  Physical Exam: Blood pressure  158/64, pulse 88, temperature 98.2 F (36.8 C), temperature source Oral, resp. rate 20, height 5\' 6"  (1.676 m), weight 88.9 kg (195 lb 15.8 oz), SpO2 92.00%.    General: Well developed, well nourished, in no acute distress Head: Eyes PERRLA, No xanthomas.   Normal cephalic and atramatic  Lungs:   Clear bilaterally to auscultation and percussion. Normal respiratory effort. No wheezes, no rales. Heart:   HRRR S1 S2 Pulses are 2+ & equal. No significant murmurs, no rubs.            No carotid bruit. No JVD.  No abdominal bruits. No femoral bruits. Abdomen: Bowel sounds are positive, abdomen soft and non-tender without masses. No hepatosplenomegaly. Msk:  Back normal. Normal strength and tone for age. Extremities:   No clubbing, cyanosis. 2+ lower extremity edema. TED hose in place.  DP +1 Neuro: Alert and oriented X 3, non-focal, MAE x 4 GU: Deferred Rectal: Deferred Psych:  Good affect, responds appropriately    Labs:   Lab Results  Component Value Date   WBC 6.1 02/06/2013   HGB 7.6* 02/06/2013   HCT 23.0* 02/06/2013   MCV 95.8 02/06/2013   PLT 147* 02/06/2013    Recent Labs Lab 02/06/13 0446  NA 139  K 4.6  CL 103  CO2 29  BUN 58*  CREATININE 1.56*  CALCIUM 8.9  PROT 5.7*  BILITOT 0.6  ALKPHOS 61  ALT 22  AST 13  GLUCOSE 117*   No results found for this basename: PTT   Lab Results  Component Value Date   INR 1.03 07/31/2012   INR 1.03 04/16/2012   INR 1.04 07/31/2011   Lab Results  Component Value Date   CKTOTAL 29 02/05/2013   CKMB 5.5* 02/05/2013   TROPONINI 0.42* 02/06/2013        Radiology:  X-ray Chest Pa And Lateral  02/05/2013   *RADIOLOGY REPORT*  Clinical Data: Shortness of breath with cough and chest congestion.  CHEST - 2 VIEW  Comparison: 08/20/2012  Findings: AP and lateral views of the chest show low volumes with cardiomegaly.  There is vascular congestion with bibasilar atelectasis or infiltrate.  Small bilateral pleural effusions are noted. Telemetry leads  overlie the chest. Imaged bony structures of the thorax are intact.  IMPRESSION: Low volume film with cardiomegaly.  Bibasilar atelectasis/infiltrate with small bilateral pleural effusions.   Original Report Authenticated By: Kennith Center, M.D.   Personally viewed.  EKG: EKG from 02/06/13 at 8:10 AM does demonstrate ST segment depression in leads 3, 4, 5 in the precordial region.  A repeat EKG at 10:42 AM shows resolution of these ST segment changes.  Personally viewed.   Echocardiogram 08/19/12: EF 55%. No obvious wall motion abnormalities.  ASSESSMENT/PLAN:   79 year old male with myelodysplastic syndrome, chronic anemia, high output heart failure with recent transfusion and cardiac biomarkers mildly positive with a troponin of 0.5-0.4. BNP is 10,700. Total CK 29.  1. Elevated troponin  - Very similar to previous encounter in February her echocardiogram at that time showed no obvious wall motion abnormalities with normal EF.  - Troponin is most likely elevated in the setting of demand ischemia as a result of his chronic anemia. Transfusion increased his hemoglobin from 7.0-7.6. I agree with Dr. Mahala Menghini that there are ST segment depressions in the precordial leads as stated above, mild approximately 1 mm. These ST segment depressions had resolved. - Based upon his minimally elevated troponin, chronic anemia, advanced age I would continue with medical management which includes periodic transfusions. The ST segment depression did occur when his heart rate was mildly elevated. I agree with carvedilol, beta blocker, to assist with overall relaxation/rate control to hopefully control ischemic symptoms as well. I will increase to 6.25 mg twice daily.  - No further stress test/cath or ECHO. Discussed with family who understands.   - If unable to tolerate increase in Bb, could try low dose Imdur.    2. High output heart failure   - A result of anemia. Continue to treat medically, beta blocker, furosemide.  Agree with IV furosemide currently.  3. Hypertension Mildly elevated, increasing carvedilol may help as well. Continue with amlodipine.    4. Transient bradycardia  - May not be able to tolerate increase in Coreg. Will try none the less.   Donato Schultz, MD  02/06/2013  1:32 PM

## 2013-02-06 NOTE — Progress Notes (Signed)
Pt. Has been frequently dropping to a pulse of 38 and 39 and then shooting back up to 60's. Patient was asleep when he dropped. After his pulse dropped he also had 4 consecutive pauses of 2 seconds a piece. Dr. Mahala Menghini notified. No new orders at this time. Will continue to monitor.

## 2013-02-06 NOTE — Progress Notes (Signed)
Mamie Levers PA forDr Skeins on call responds to Day Rn page re: HR dropping into 30's and pauses.  Acknowledges information and orders to "hold"  Coreg for tonight.  Pt remains asyptomatic, will continue to monitor

## 2013-02-06 NOTE — Progress Notes (Signed)
While assessing, pt complained of left sided back, shoulder and arm pain 10/10. Performed EKG and vitals. EKG showed Sinus rhythm with subendocardial injury which is a change from his 0200 EKG. Paged Dr. Elisabeth Pigeon with results at 0820. Third troponin came back at 0.48 this AM. Will continue to monitor.   Gave pain medication as well.

## 2013-02-06 NOTE — Progress Notes (Signed)
[facility] cardiology per Dr. Mahala Menghini regarding pt's frequent drop in pulse. I was on hold for ten minutes with no answer. Will call back shortly.

## 2013-02-06 NOTE — Progress Notes (Signed)
Pt's HR dropping down into the 30's often but nonsustained. Upon assessment pt is arousable, no complaints but irritable and refusing for vitals to be taken. Able to do EKG- shows NSR with marked sinus arrhythmia. NP on call notified. No new orders. Will continue to monitor through the night.  Israella Hubert, Ok Edwards RN

## 2013-02-07 DIAGNOSIS — I509 Heart failure, unspecified: Secondary | ICD-10-CM

## 2013-02-07 DIAGNOSIS — E119 Type 2 diabetes mellitus without complications: Secondary | ICD-10-CM

## 2013-02-07 DIAGNOSIS — D649 Anemia, unspecified: Secondary | ICD-10-CM

## 2013-02-07 DIAGNOSIS — D46C Myelodysplastic syndrome with isolated del(5q) chromosomal abnormality: Secondary | ICD-10-CM

## 2013-02-07 DIAGNOSIS — I10 Essential (primary) hypertension: Secondary | ICD-10-CM

## 2013-02-07 LAB — CBC
HCT: 22.8 % — ABNORMAL LOW (ref 39.0–52.0)
Hemoglobin: 7.4 g/dL — ABNORMAL LOW (ref 13.0–17.0)
RDW: 20.3 % — ABNORMAL HIGH (ref 11.5–15.5)
WBC: 5.9 10*3/uL (ref 4.0–10.5)

## 2013-02-07 LAB — COMPREHENSIVE METABOLIC PANEL
ALT: 20 U/L (ref 0–53)
Albumin: 3.2 g/dL — ABNORMAL LOW (ref 3.5–5.2)
Alkaline Phosphatase: 59 U/L (ref 39–117)
BUN: 62 mg/dL — ABNORMAL HIGH (ref 6–23)
Chloride: 100 mEq/L (ref 96–112)
Potassium: 4.4 mEq/L (ref 3.5–5.1)
Sodium: 139 mEq/L (ref 135–145)
Total Bilirubin: 0.4 mg/dL (ref 0.3–1.2)
Total Protein: 5.5 g/dL — ABNORMAL LOW (ref 6.0–8.3)

## 2013-02-07 MED ORDER — FUROSEMIDE 10 MG/ML IJ SOLN
40.0000 mg | Freq: Once | INTRAMUSCULAR | Status: AC
  Administered 2013-02-07: 40 mg via INTRAVENOUS

## 2013-02-07 MED ORDER — DIPHENHYDRAMINE HCL 25 MG PO CAPS
25.0000 mg | ORAL_CAPSULE | Freq: Once | ORAL | Status: AC
  Administered 2013-02-07: 25 mg via ORAL
  Filled 2013-02-07: qty 1

## 2013-02-07 MED ORDER — ISOSORBIDE MONONITRATE ER 30 MG PO TB24
30.0000 mg | ORAL_TABLET | Freq: Every day | ORAL | Status: DC
  Administered 2013-02-07 – 2013-02-08 (×2): 30 mg via ORAL
  Filled 2013-02-07 (×2): qty 1

## 2013-02-07 MED ORDER — SODIUM CHLORIDE 0.9 % IV SOLN: 250.0000 mL | Freq: Once | INTRAVENOUS | Status: AC

## 2013-02-07 MED ORDER — FUROSEMIDE 10 MG/ML IJ SOLN: 20.0000 mg | Freq: Once | INTRAMUSCULAR | Status: AC

## 2013-02-07 MED ORDER — ACETAMINOPHEN 325 MG PO TABS
650.0000 mg | ORAL_TABLET | Freq: Once | ORAL | Status: AC
  Administered 2013-02-07: 650 mg via ORAL
  Filled 2013-02-07 (×2): qty 1

## 2013-02-07 MED ORDER — CARVEDILOL 3.125 MG PO TABS
3.1250 mg | ORAL_TABLET | Freq: Two times a day (BID) | ORAL | Status: DC
  Administered 2013-02-07 – 2013-02-08 (×3): 3.125 mg via ORAL
  Filled 2013-02-07 (×4): qty 1

## 2013-02-07 NOTE — Progress Notes (Signed)
Aaron Bruce ZOX:[id number] DOB: 02-20-1925 DOA: 02/05/2013 PCP: Michiel Sites, MD  Brief narrative: 79 yr old cm admitted with Le swelling, SOB cw potetital CHF in setting of chronic anemia.  Noted to also have some CP.  Troponin's POC came back + and then evolution of EKG changes showed potentitial NSTEMI-cardiology consulted 02/06/13.  Transfuse don admission 8/8 and then 2 mor euntis transfused 8/10  Past medical history-As per Problem list  Consultants:  Oncology  Cardiology  Procedures:  none  Antibiotics:  none   Subjective  Doing fair.  Somewhat laconic.  Doesn;t remember me from admission.  More alert than yesterday No cp, n,v,sob currently Hasn't passed stool as yet.     Objective    Interim History: none  Telemetry: Sinus arrythmia c pvc's-brady resolved?  Objective: Filed Vitals:   02/06/13 0804 02/06/13 1454 02/06/13 2033 02/07/13 0431  BP: 158/64 121/37 119/45 134/50  Pulse: 88 51 79 74  Temp:  98.3 F (36.8 C) 98.3 F (36.8 C) 98.7 F (37.1 C)  TempSrc:  Oral Oral Oral  Resp: 20 18 18 19   Height:      Weight:    86.3 kg (190 lb 4.1 oz)  SpO2: 92% 100% 92% 99%    Intake/Output Summary (Last 24 hours) at 02/07/13 8119 Last data filed at 02/07/13 0500  Gross per 24 hour  Intake   1200 ml  Output   3275 ml  Net  -2075 ml    Exam:  General: alert oriented in nad Cardiovascular: s1 s2 no m/r/g Respiratory: clear, no added  sound Abdomen: soft, NT Nd Skin grade 1 LE edema Neuro intact   Data Reviewed: Basic Metabolic Panel:  Recent Labs Lab 02/02/13 1647 02/05/13 1518 02/06/13 0446 02/07/13 0403  NA 136 142 139 139  K 5.4* 4.9 4.6 4.4  CL 101 103 103 100  CO2 26 29 29  33*  GLUCOSE 158* 219* 117* 108*  BUN 50* 50* 58* 62*  CREATININE 1.33 1.39* 1.56* 1.50*  CALCIUM 9.0 9.0 8.9 8.9   Liver Function Tests:  Recent Labs Lab 02/05/13 1518 02/06/13 0446 02/07/13 0403  AST 14 13 12   ALT 23 22 20   ALKPHOS 71 61  59  BILITOT 0.3 0.6 0.4  PROT 5.9* 5.7* 5.5*  ALBUMIN 3.4* 3.4* 3.2*   No results found for this basename: LIPASE, AMYLASE,  in the last 168 hours No results found for this basename: AMMONIA,  in the last 168 hours CBC:  Recent Labs Lab 02/02/13 1317 02/05/13 1518 02/06/13 0446 02/07/13 0403  WBC 6.0 7.3 6.1 5.9  NEUTROABS 2.9  --   --   --   HGB 7.5* 7.0* 7.6* 7.4*  HCT 22.3* 21.5* 23.0* 22.8*  MCV 95.5 97.7 95.8 96.6  PLT 158 188 147* 166   Cardiac Enzymes:  Recent Labs Lab 02/05/13 1518 02/05/13 2217 02/06/13 0446  CKTOTAL 29  --   --   CKMB 5.5*  --   --   TROPONINI 0.53* 0.48* 0.42*   BNP: No components found with this basename: POCBNP,  CBG:  Recent Labs Lab 02/06/13 0744 02/06/13 1152 02/06/13 1709 02/06/13 2059 02/07/13 0749  GLUCAP 115* 74 122* 154* 114*    Recent Results (from the past 240 hour(s))  MRSA PCR SCREENING     Status: Abnormal   Collection Time    02/05/13  2:48 PM      Result Value Range Status   MRSA by PCR POSITIVE (*) NEGATIVE  Final   Comment:            The GeneXpert MRSA Assay (FDA     approved for NASAL specimens     only), is one component of a     comprehensive MRSA colonization     surveillance program. It is not     intended to diagnose MRSA     infection nor to guide or     monitor treatment for     MRSA infections.     RESULT CALLED TO, READ BACK BY AND VERIFIED WITH:     K CAPES RN 2039 02/05/13 A NAVARRO     Studies:              All Imaging reviewed and is as per above notation   Scheduled Meds: . amLODipine  10 mg Oral Daily  . aspirin  325 mg Oral Daily  . carvedilol  3.125 mg Oral BID  . Chlorhexidine Gluconate Cloth  6 each Topical Q0600  . ferrous sulfate  325 mg Oral BID  . FLUoxetine  10 mg Oral Daily  . furosemide  20 mg Intravenous Once  . furosemide  40 mg Intravenous BID  . furosemide  40 mg Intravenous Once  . insulin aspart  0-9 Units Subcutaneous TID WC  . insulin aspart  3 Units  Subcutaneous TID WC  . insulin detemir  18 Units Subcutaneous QHS  . isosorbide mononitrate  30 mg Oral Daily  . mupirocin ointment  1 application Nasal BID  . Teriparatide (Recombinant)  20 mcg Transdermal Daily  . vitamin B-12  1,000 mcg Oral Daily   Continuous Infusions:    Assessment/Plan:   1. Nstemi 2/2 to demand ischemia/Post infarct?- keep coreg 3.125, Imdur added 30 daily 2. Acute exacerbation of diastolic heart failure-probably secondary to high output heart failure from anemia- Diuresis 40 mg IV Lasix twice a day-Net -3.28 liters since 8/8 3. AKI-iatrogenic-needs diuresis however.  Monitor creat in am-will change to PO lasix 8/11 and re-eval for d/c 4. Myelodysplasia-failed Revlimid therapy per oncologist-transfused 8/8.  2 more units on 8/10 5. Diabetes mellitus type 2-continue Levemir 18 units each bedtime, sliding scale coverage 6. Hypertension continue amlodipine 10 mg, Coreg 3.121 added at this admission, and IMdur 30 7. Depression continue Prozac 10 mg daily 8. Hypercalcemia-continue teriparatide 20 mcg daily 9. Symptomatic anemia-continue ferrous sulfate 325, vitamin B12 1000 mcg daily 10. History CVA continue aspirin 81 for now given anemia   Code Status: Full Family Communication: none at bedisde Disposition Plan: inpatient   Pleas Koch, MD  [facility] Pager [phone] 02/07/2013, 9:22 AM    LOS: 2 days

## 2013-02-07 NOTE — Progress Notes (Signed)
IP PROGRESS NOTE  Subjective:   The dyspnea has improved since hospital admission. Up eating breakfast, no specific complaint  Objective: Vital signs in last 24 hours: Blood pressure 134/50, pulse 74, temperature 98.7 F (37.1 C), temperature source Oral, resp. rate 19, height 5\' 6"  (1.676 m), weight 190 lb 4.1 oz (86.3 kg), SpO2 99.00%.  Intake/Output from previous day: 08/09 0701 - 08/10 0700 In: 1200 [P.O.:1200] Out: 4475 [Urine:4475]  Physical Exam:  HEENT: The neck veins are less distended Lungs: Decreased breath sounds at the lower chest bilaterally, no respiratory distress Cardiac: Regular rate and rhythm Abdomen: No hepatosplenomegaly Extremities: Trace low leg edema bilaterally     Lab Results:  Recent Labs  02/06/13 0446 02/07/13 0403  WBC 6.1 5.9  HGB 7.6* 7.4*  HCT 23.0* 22.8*  PLT 147* 166    BMET  Recent Labs  02/06/13 0446 02/07/13 0403  NA 139 139  K 4.6 4.4  CL 103 100  CO2 29 33*  GLUCOSE 117* 108*  BUN 58* 62*  CREATININE 1.56* 1.50*  CALCIUM 8.9 8.9    Studies/Results: X-ray Chest Pa And Lateral  02/05/2013   *RADIOLOGY REPORT*  Clinical Data: Shortness of breath with cough and chest congestion.  CHEST - 2 VIEW  Comparison: 08/20/2012  Findings: AP and lateral views of the chest show low volumes with cardiomegaly.  There is vascular congestion with bibasilar atelectasis or infiltrate.  Small bilateral pleural effusions are noted. Telemetry leads overlie the chest. Imaged bony structures of the thorax are intact.  IMPRESSION: Low volume film with cardiomegaly.  Bibasilar atelectasis/infiltrate with small bilateral pleural effusions.   Original Report Authenticated By: Kennith Center, M.D.    Medications: I have reviewed the patient's current medications.  Assessment/Plan: 1. 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 He began Revlimid 11/30/2009. Revlimid was placed on hold 12/13/2009 when the absolute  neutrophil count returned at 0.5. The Revlimid was resumed at a dose of 10 mg every other day on 12/21/2009. The anemia improved. Revlimid was decreased to 5 mg every other day 01/2010 due to neutropenia. Revlimid was discontinued following an office visit 02/28/2012 due to progressive anemia and leukopenia. Bone marrow biopsy on 04/16/2012 confirmed changes of myelodysplasia with no increase in blasts (5q-and 9:11 translocation). Revlimid resumed on 05/27/2012. Increased to 5 mg daily on 06/23/2012. Revlimid was discontinued during a hospitalization February 2014 due to continued severe anemia. He began a trial of weekly Procrit on 09/08/2012. He continues to require frequent red cell transfusions. The Procrit will be discontinued. -Status post one unit of packed red blood cells on 02/05/2013 2. Probable cellulitis of the left lower posterior leg 12/13/2009, resolved following treatment with ciprofloxacin.  3. Left lower extremity edema and pain 12/13/2009 with a negative venous Doppler. 4. History of hypertension. 5. Diabetes. 6. History of a cerebrovascular accident. 7. Hyperlipidemia. 8. Peripheral vascular disease. 9. History of neutropenia secondary to Revlimid. 10. History of Mild thrombocytopenia. Likely secondary to myelodysplasia. 11. Status post kyphoplasty L3 and L4 on 07/31/2011 for compression fractures. 12. Mildly elevated creatinine 06/08/2012. 13. Hospitalization 06/16/2013 through 08/24/2012 with dyspnea found to be in acute congestive heart failure related to severe anemia. 14. Admission with congestive heart failure 02/05/2013-clinically improved with diuresis  [name] has persistent severe transfusion-dependent anemia secondary to myelodysplasia. He has not responded to treatment with Revlimid or erythropoietin. He has multiple comorbid conditions. I do not recommend 5-azacytidine therapy in his case.  Recommendations:  1. Transfuse 2 additional units  of packed red blood  cells for treatment of the myelodysplasia  2. Continue diuresis for the congestive heart failure,? Long-term diuretic regimen  I appreciate the care from Dr. Mahala Menghini.         LOS: 2 days   Lachae Hohler, Jillyn Hidden  02/07/2013, 8:36 AM

## 2013-02-07 NOTE — Progress Notes (Addendum)
Subjective:  Over last eve, HR decreased 30's. Coreg increase to 6.25mg  not successful. No complaints now. No CP/arm pain.   Objective:  Vital Signs in the last 24 hours: Temp:  [98.3 F (36.8 C)-98.7 F (37.1 C)] 98.7 F (37.1 C) (08/10 0431) Pulse Rate:  [51-79] 74 (08/10 0431) Resp:  [18-19] 19 (08/10 0431) BP: (119-134)/(37-50) 134/50 mmHg (08/10 0431) SpO2:  [92 %-100 %] 99 % (08/10 0431) Weight:  [86.3 kg (190 lb 4.1 oz)] 86.3 kg (190 lb 4.1 oz) (08/10 0431)  Intake/Output from previous day: 08/09 0701 - 08/10 0700 In: 1200 [P.O.:1200] Out: 4475 [Urine:4475]   Physical Exam: General: Well developed, well nourished, in no acute distress. Head:  Normocephalic and atraumatic. Lungs: Clear to auscultation and percussion. Heart: Normal S1 and S2.  No murmur, rubs or gallops.  Abdomen: soft, non-tender, positive bowel sounds. Extremities: No clubbing or cyanosis. 1-2+ edema.Improved TED. Neurologic: Alert today    Lab Results:  Recent Labs  02/06/13 0446 02/07/13 0403  WBC 6.1 5.9  HGB 7.6* 7.4*  PLT 147* 166    Recent Labs  02/06/13 0446 02/07/13 0403  NA 139 139  K 4.6 4.4  CL 103 100  CO2 29 33*  GLUCOSE 117* 108*  BUN 58* 62*  CREATININE 1.56* 1.50*    Recent Labs  02/05/13 2217 02/06/13 0446  TROPONINI 0.48* 0.42*   Hepatic Function Panel  Recent Labs  02/07/13 0403  PROT 5.5*  ALBUMIN 3.2*  AST 12  ALT 20  ALKPHOS 59  BILITOT 0.4    Imaging: X-ray Chest Pa And Lateral  02/05/2013   *RADIOLOGY REPORT*  Clinical Data: Shortness of breath with cough and chest congestion.  CHEST - 2 VIEW  Comparison: 08/20/2012  Findings: AP and lateral views of the chest show low volumes with cardiomegaly.  There is vascular congestion with bibasilar atelectasis or infiltrate.  Small bilateral pleural effusions are noted. Telemetry leads overlie the chest. Imaged bony structures of the thorax are intact.  IMPRESSION: Low volume film with cardiomegaly.   Bibasilar atelectasis/infiltrate with small bilateral pleural effusions.   Original Report Authenticated By: Kennith Center, M.D.    Telemetry: As above Personally viewed.   Assessment/Plan:  Principal Problem:   Acute diastolic CHF (congestive heart failure) Active Problems:   MDS (myelodysplastic syndrome) with 5q deletion   Elevated troponin   - Decreased coreg back to 3.125mg  PO BID. If continues to have significant bradycardia, may need to stop all together.   - In lieu of increasing coreg, I have added low dose Imdur 30mg  PO QD   - Dr. Truett Perna to transfuse 2 units today. Keeping Hg increased will also help.   - Good diuresis with IV lasix 40 BID. Let continue today.  - Once again as stated in consult, no further cardiac testing. Will continue with medical mgt.    SKAINS, MARK 02/07/2013, 8:19 AM

## 2013-02-08 ENCOUNTER — Non-Acute Institutional Stay (SKILLED_NURSING_FACILITY): Payer: Medicare Other | Admitting: Internal Medicine

## 2013-02-08 ENCOUNTER — Encounter: Payer: Self-pay | Admitting: Internal Medicine

## 2013-02-08 DIAGNOSIS — E119 Type 2 diabetes mellitus without complications: Secondary | ICD-10-CM | POA: Diagnosis not present

## 2013-02-08 DIAGNOSIS — I509 Heart failure, unspecified: Secondary | ICD-10-CM

## 2013-02-08 DIAGNOSIS — I10 Essential (primary) hypertension: Secondary | ICD-10-CM

## 2013-02-08 DIAGNOSIS — D649 Anemia, unspecified: Secondary | ICD-10-CM

## 2013-02-08 DIAGNOSIS — R7989 Other specified abnormal findings of blood chemistry: Secondary | ICD-10-CM | POA: Diagnosis not present

## 2013-02-08 DIAGNOSIS — F329 Major depressive disorder, single episode, unspecified: Secondary | ICD-10-CM

## 2013-02-08 DIAGNOSIS — D46C Myelodysplastic syndrome with isolated del(5q) chromosomal abnormality: Secondary | ICD-10-CM

## 2013-02-08 DIAGNOSIS — I6350 Cerebral infarction due to unspecified occlusion or stenosis of unspecified cerebral artery: Secondary | ICD-10-CM

## 2013-02-08 DIAGNOSIS — F32A Depression, unspecified: Secondary | ICD-10-CM

## 2013-02-08 DIAGNOSIS — I639 Cerebral infarction, unspecified: Secondary | ICD-10-CM

## 2013-02-08 DIAGNOSIS — I5031 Acute diastolic (congestive) heart failure: Secondary | ICD-10-CM

## 2013-02-08 DIAGNOSIS — F3289 Other specified depressive episodes: Secondary | ICD-10-CM | POA: Diagnosis not present

## 2013-02-08 LAB — BASIC METABOLIC PANEL
BUN: 59 mg/dL — ABNORMAL HIGH (ref 6–23)
CO2: 33 mEq/L — ABNORMAL HIGH (ref 19–32)
Chloride: 97 mEq/L (ref 96–112)
Glucose, Bld: 150 mg/dL — ABNORMAL HIGH (ref 70–99)
Potassium: 4 mEq/L (ref 3.5–5.1)
Sodium: 138 mEq/L (ref 135–145)

## 2013-02-08 LAB — TYPE AND SCREEN
ABO/RH(D): O POS
Unit division: 0

## 2013-02-08 LAB — CBC
HCT: 29.1 % — ABNORMAL LOW (ref 39.0–52.0)
Hemoglobin: 9.6 g/dL — ABNORMAL LOW (ref 13.0–17.0)
MCH: 30.8 pg (ref 26.0–34.0)
MCHC: 33 g/dL (ref 30.0–36.0)
MCV: 93.3 fL (ref 78.0–100.0)
RBC: 3.12 MIL/uL — ABNORMAL LOW (ref 4.22–5.81)

## 2013-02-08 MED ORDER — CARVEDILOL 3.125 MG PO TABS: 3.1250 mg | ORAL_TABLET | Freq: Two times a day (BID) | ORAL | Status: AC

## 2013-02-08 MED ORDER — FUROSEMIDE 40 MG PO TABS: 40.0000 mg | ORAL_TABLET | Freq: Two times a day (BID) | ORAL | Status: DC

## 2013-02-08 MED ORDER — ISOSORBIDE MONONITRATE ER 30 MG PO TB24: 30.0000 mg | ORAL_TABLET | Freq: Every day | ORAL | Status: AC

## 2013-02-08 NOTE — Assessment & Plan Note (Signed)
Was diuresed 6.9 liters in hospital; failure felt to be sec to pt's anemia from myelodysplastic syndrome;daily weights here and 40 mg Lasix extra for > 4 pounds over baseline here of 197 pounds

## 2013-02-08 NOTE — Assessment & Plan Note (Signed)
Continue teriparatide daily

## 2013-02-08 NOTE — Assessment & Plan Note (Signed)
Continue amlodipine; coreg and Imdur were added to regimen this admission

## 2013-02-08 NOTE — Assessment & Plan Note (Signed)
Continue Prozac

## 2013-02-08 NOTE — Progress Notes (Signed)
MRN: [id number] Name: Aaron Bruce  Sex: male Age: 79 y.o. DOB: 08-Feb-1925  [facility] #: [email or web address] Facility/Room: 231B Level Of Care: SNF Provider: Merrilee Seashore D Emergency Contacts: Extended Emergency Contact Information Primary Emergency Contact: Price,Margie Address: [address] [place], [place] [address] [place] Home Phone: [phone] Relation: Spouse Secondary Emergency Contact: Scott,Brenda  [place] Home Phone: [phone] Relation: Daughter  Code Status: FULL  Allergies: Review of patient's allergies indicates no known allergies.  Chief Complaint  Patient presents with  . nursing home ADMISSION    HPI: Patient is 79 y.o. male who is readmitted to SNF after being in hosp for acute CHF precipitated by anemia from myelopysplasia.  Past Medical History  Diagnosis Date  . Anemia   . Hypertension   . Diabetes mellitus   . Hyperlipemia   . Peripheral vascular disease   . Arthritis     Osteoarthritis  . Irritable bowel syndrome (IBS)   . Myelodysplasia with 5q deletion   . Thrombocytopenia     stable--due to Revlimid  . Chronic CHF 01/28/2013  . Stroke   . Depression   . Hypercalcemia     Past Surgical History  Procedure Laterality Date  . Tonsillectomy  1948  . Kidney stone surgery  1950's      Medication List       This list is accurate as of: 02/08/13  2:39 PM.  Always use your most recent med list.               amLODipine 10 MG tablet  Commonly known as:  NORVASC  Take 10 mg by mouth daily.     aspirin 325 MG tablet  Take 1 tablet (325 mg total) by mouth daily.     carvedilol 3.125 MG tablet  Commonly known as:  COREG  Take 1 tablet (3.125 mg total) by mouth 2 (two) times daily.     cyanocobalamin 1000 MCG tablet  Take 1 tablet (1,000 mcg total) by mouth daily.     ezetimibe-simvastatin 10-40 MG per tablet  Commonly known as:  VYTORIN  Take 1 tablet by mouth at bedtime.     ferrous  sulfate 325 (65 FE) MG tablet  Take 325 mg by mouth 2 (two) times daily.     FLUoxetine 10 MG capsule  Commonly known as:  PROZAC  Take 10 mg by mouth daily.     FORTEO [place]  Inject 20 mcg into the skin daily.     furosemide 40 MG tablet  Commonly known as:  LASIX  Take 1 tablet (40 mg total) by mouth 2 (two) times daily.     HYDROcodone-acetaminophen 5-325 MG per tablet  Commonly known as:  NORCO/VICODIN  Take 1 tablet by mouth every 4 (four) hours as needed.     insulin aspart 100 UNIT/ML injection  Commonly known as:  novoLOG  Inject 0-9 Units into the skin 3 (three) times daily with meals.     insulin detemir 100 UNIT/ML injection  Commonly known as:  LEVEMIR  Inject 18 Units into the skin at bedtime.     isosorbide mononitrate 30 MG 24 hr tablet  Commonly known as:  IMDUR  Take 1 tablet (30 mg total) by mouth daily.     multivitamin with minerals Tabs tablet  Take 1 tablet by mouth daily.     MYRBETRIQ 50 MG Tb24 tablet  Generic drug:  mirabegron  ER  Take 50 mg by mouth Daily.        No orders of the defined types were placed in this encounter.     There is no immunization history on file for this patient.  History  Substance Use Topics  . Smoking status: Former Smoker    Quit date: 02/06/1939  . Smokeless tobacco: Never Used  . Alcohol Use: No    Family history is noncontributory    Review of Systems  DATA OBTAINED: from patient GENERAL: Feels well no fevers, fatigue, appetite changes SKIN: No itching, rash or wounds EYES: No eye pain, redness, discharge EARS: No earache, tinnitus, change in hearing NOSE: No congestion, drainage or bleeding  MOUTH/THROAT: No mouth or tooth pain, No sore throat, No difficulty chewing or swallowing  RESPIRATORY: some cough, wheezing, SOB same as usual CARDIAC: No chest pain, palpitations, lower extremity edema  GI: No abdominal pain, No N/V/D or constipation, No heartburn or reflux  GU: No dysuria, frequency or  urgency, or incontinence  MUSCULOSKELETAL: No unrelieved bone/joint pain NEUROLOGIC: no c/o PSYCHIATRIC: No overt anxiety or sadness. Sleeps well. No behavior issue.      Filed Vitals:   02/08/13 1349  BP: 122/70  Pulse: 81  Temp: 97.8 F (36.6 C)  Resp: 18    Physical Exam  GENERAL APPEARANCE: Alert, conversant. Appropriately groomed. No acute distress.  SKIN: No diaphoresis rash, or wounds HEAD: Normocephalic, atraumatic  EYES: Conjunctiva/lids clear. Pupils round, reactive. EOMs intact.  EARS: External exam WNL, canals clear. Hearing grossly normal.  NOSE: No deformity or discharge.  MOUTH/THROAT: Lips w/o lesions. Mouth and throat normal. Tongue moist, w/o lesion.  RESPIRATORY: Breathing is even, unlabored. Lung sounds are clear   CARDIOVASCULAR: Heart RRR with 2/6 systolic murmur; No peripheral edema.  ARTERIAL: radial pulse 2+, faint DP pulse VENOUS: No varicosities. No venous stasis skin changes  GASTROINTESTINAL: Abdomen is soft, non-tender, not distended w/ normal bowel sounds.  GENITOURINARY: Bladder non tender, not distended  MUSCULOSKELETAL: No abnormal joints or musculature NEUROLOGIC:. Cranial nerves 2-12 grossly intact. PSYCHIATRIC: Mood and affect appropriate to situation, no behavioral issues  Patient Active Problem List   Diagnosis Date Noted  . Stroke   . Depression   . Hypercalcemia   . Elevated troponin 02/06/2013  . Chronic CHF 01/28/2013  . Acute diastolic CHF (congestive heart failure) 08/21/2012  . Sinus node dysfunction 08/20/2012  . Dyspnea 08/18/2012  . Hypoxia 08/18/2012  . Anemia requiring transfusions 08/17/2012  . DM2 (diabetes mellitus, type 2) 08/17/2012  . Urinary tract infection 08/17/2012  . L5 vertebral fracture 08/17/2012  . HTN (hypertension), benign 08/17/2012  . Hyperlipidemia 08/17/2012  . MDS (myelodysplastic syndrome) with 5q deletion 06/17/2011    Functional assessment:   CBC    Component Value Date/Time   WBC  7.0 02/08/2013 0350   WBC 6.0 02/02/2013 1317   RBC 3.12* 02/08/2013 0350   RBC 2.34* 02/02/2013 1317   HGB 9.6* 02/08/2013 0350   HGB 7.5* 02/02/2013 1317   HCT 29.1* 02/08/2013 0350   HCT 22.3* 02/02/2013 1317   PLT 154 02/08/2013 0350   PLT 158 02/02/2013 1317   MCV 93.3 02/08/2013 0350   MCV 95.5 02/02/2013 1317   LYMPHSABS 1.8 02/02/2013 1317   LYMPHSABS 1.2 08/17/2012 1815   MONOABS 0.9 02/02/2013 1317   MONOABS 0.8 08/17/2012 1815   EOSABS 0.3 02/02/2013 1317   EOSABS 0.4 08/17/2012 1815   BASOSABS 0.2* 02/02/2013 1317   BASOSABS 0.2* 08/17/2012 1815  CMP     Component Value Date/Time   NA 138 02/08/2013 0350   NA 139 01/27/2013 0823   K 4.0 02/08/2013 0350   K 5.1 01/27/2013 0823   CL 97 02/08/2013 0350   CL 105 07/28/2012 1507   CO2 33* 02/08/2013 0350   CO2 23 01/27/2013 0823   GLUCOSE 150* 02/08/2013 0350   GLUCOSE 202* 01/27/2013 0823   GLUCOSE 233* 07/28/2012 1507   BUN 59* 02/08/2013 0350   BUN 48.5* 01/27/2013 0823   CREATININE 1.27 02/08/2013 0350   CREATININE 1.3 01/27/2013 0823   CALCIUM 9.2 02/08/2013 0350   CALCIUM 8.7 01/27/2013 0823   PROT 5.5* 02/07/2013 0403   PROT 5.8* 01/27/2013 0823   ALBUMIN 3.2* 02/07/2013 0403   ALBUMIN 3.3* 01/27/2013 0823   AST 12 02/07/2013 0403   AST 14 01/27/2013 0823   ALT 20 02/07/2013 0403   ALT 26 01/27/2013 0823   ALKPHOS 59 02/07/2013 0403   ALKPHOS 67 01/27/2013 0823   BILITOT 0.4 02/07/2013 0403   BILITOT 0.73 01/27/2013 0823   GFRNONAA 50* 02/08/2013 0350   GFRAA 58* 02/08/2013 0350    Assessment and Plan  MDS (myelodysplastic syndrome) with 5q deletion With anemia requiring transfusions;was tx 2 units 2 different days this admission. Outpatient visit with oncologist ordered and CBC in 3 days.  Acute diastolic CHF (congestive heart failure) Was diuresed 6.9 liters in hospital; failure felt to be sec to pt's anemia from myelodysplastic syndrome;daily weights here and 40 mg Lasix extra for > 4 pounds over baseline here of 197 pounds  DM2 (diabetes  mellitus, type 2) Continue Levemir 18 units qHS; will use NH sliding scale every meal here  HTN (hypertension), benign Continue amlodipine; coreg and Imdur were added to regimen this admission  Elevated troponin In hospital it was felt pt may have had a Nstemi and pt was seen by cardilology who did not feel aggressive workup was appropriate; agreed with keeping coreg and adding Imdur, both of which will be continued here  Anemia requiring transfusions Pt will be followed by oncology as out pt; will probably need future transfusions;will continue iron and vit B12  Stroke Because of anemia will use daily ASA 81 mg only  Hypercalcemia Continue teriparatide daily  Depression Continue Prozac    Margit Hanks, MD

## 2013-02-08 NOTE — Progress Notes (Addendum)
CSW received referral that patient was admitted from [facility] SNF. CSW called & confirmed with patient's sister-in-law, Aaron Bruce (ph#: [phone]) that patient is to return to [facility]. Note patient is set to discharge back today. CSW confirmed with Aaron Bruce @ [facility] that patient is ok to return. Discharge packet given to RN, Toni Amend. PTAR scheduled for 10:30am pickup (Service Request Id: [id number]).   Clinical Social Work Department BRIEF PSYCHOSOCIAL ASSESSMENT 02/09/2013  Patient:  Aaron Bruce, Aaron Bruce     Account Number:  [id number]     Admit date:  02/05/2013  Clinical Social Worker:  Orpah Greek  Date/Time:  02/09/2013 11:08 AM  Referred by:  Physician  Date Referred:  02/09/2013 Referred for  Other - See comment   Other Referral:   Admitted from: [facility]   Interview type:  Patient Other interview type:    PSYCHOSOCIAL DATA Living Status:  FACILITY Admitted from facility:  [facility] Level of care:  Skilled Nursing Facility Primary support name:  Aaron Bruce (wife) ph#: [phone] Primary support relationship to patient:  SPOUSE Degree of support available:   good    CURRENT CONCERNS Current Concerns  Post-Acute Placement   Other Concerns:    SOCIAL WORK ASSESSMENT / PLAN CSW received consult that patient was admitted from [facility].   Assessment/plan status:  Information/Referral to [facility] Other assessment/ plan:   Information/referral to community resources:   CSW completed FL2 and faxed information to [facility] - confirmed with Aaron Bruce @ [facility] that they would be able to take patient back today.    PATIENT'S/FAMILY'S RESPONSE TO PLAN OF CARE: CSW spoke with patient's sister-in-law, Aaron Bruce (ph#: [phone]) who lives with patient's wife. Confirmed that family would like for patient to return to [facility].          [name], LCSW [facility] Clinical Social Worker cell #: [phone]

## 2013-02-08 NOTE — Assessment & Plan Note (Signed)
In hospital it was felt pt may have had a Nstemi and pt was seen by cardilology who did not feel aggressive workup was appropriate; agreed with keeping coreg and adding Imdur, both of which will be continued here

## 2013-02-08 NOTE — Progress Notes (Signed)
Pt being discharged back to [facility]; pts family aware; report called to [place], Charity fundraiser; awaiting transport at this time; pt stable at time of discharge

## 2013-02-08 NOTE — Discharge Summary (Signed)
Physician Discharge Summary  Aaron Bruce NWG:[id number] DOB: 1924/12/14 DOA: 02/05/2013  PCP: Michiel Sites, MD  Admit date: 02/05/2013 Discharge date: 02/08/2013  Time spent: 30 minutes  Recommendations for Outpatient Follow-up:  1. Needs daily weights and base metabolic panel in about 3-4 days 2. Recommend outpatient followup with oncologist  3. Recommend further workup/followup of myelodysplasia with oncologist-may need transfusion  Discharge Diagnoses:  Principal Problem:   Acute diastolic CHF (congestive heart failure) Active Problems:   MDS (myelodysplastic syndrome) with 5q deletion   Elevated troponin   Discharge Condition: Fair   Diet recommendation: Heart healthy  Filed Weights   02/05/13 1413 02/07/13 0431  Weight: 88.9 kg (195 lb 15.8 oz) 86.3 kg (190 lb 4.1 oz)    History of present illness:  79 yr old cm known history of myelodysplasia was seen at the oncologist office and transferred over because of shortness of breath admitted with Le swelling, SOB cw potential CHF in setting of chronic anemia. Noted to also have some CP. Troponin's POC came back + and then evolution of EKG changes showed potentitial NSTEMI-cardiology consulted 02/06/13. Transfused on admission 8/8 and then 2 more units transfused 8/10   Hospital Course:   1. Nstemi 2/2 to demand ischemia/Post infarct?- keep coreg 3.125, Imdur added 30-cardiology saw patient and did not feel necessary to workup her aggressively 2. Acute exacerbation of diastolic heart failure-probably secondary to high output heart failure from anemia- Diuresis 40 mg IV Lasix twice a day, this was changed to by mouth Lasix 40 mg twice a day on discharge-Net -6.9 liters since 8/8-we'll probably need to be met and daily weights and adjustment of diuretics as an outpatient 3. AKI-iatrogenic-needs diuresis however. Her creatinine on discharge was 1.27 4. Myelodysplasia-failed Revlimid therapy per oncologist-transfused 8/8. 2  more units on 8/10-will need further CBC workup with oncologist as an outpatient 5. Diabetes mellitus type 2-continue Levemir 18 units each bedtime, sliding scale coverage-the sugars between 120 12/29/1946 range with slight scale coverage continue home management 6. Hypertension continue amlodipine 10 mg, Coreg 3.121 added at this admission, and IMdur 30 30 mg daily 7. Depression continue Prozac 10 mg daily 8. Hypercalcemia-continue teriparatide 20 mcg daily 9. Symptomatic anemia-continue ferrous sulfate 325, vitamin B12 1000 mcg daily 10. History CVA continue aspirin 81 for now given anemia  Consultants:  Oncology  Cardiology Procedures:  none Antibiotics:  none   Discharge Exam: Filed Vitals:   02/08/13 0559  BP: 149/59  Pulse: 89  Temp: 98.3 F (36.8 C)  Resp: 20   Gave her somewhat confused no other issues currently Wheezing a little no shortness of breath chest pain  General: Alert pleasant oriented no apparent distress Cardiovascular: S1-S2 no murmur rub or, telemetry sinus rhythm Respiratory: Clinically clear  Discharge Instructions  Discharge Orders   Future Appointments Provider Department Dept Phone   02/09/2013 3:15 PM Sherrie Mustache [facility] [phone]   02/09/2013 3:45 PM Ladene Artist, MD [facility] MEDICAL ONCOLOGY [phone]   02/09/2013 4:15 PM [facility] [phone]   Future Orders Complete By Expires     Contraindication to ACEI at discharge  As directed     Diet - low sodium heart healthy  As directed     Discharge instructions  As directed     Comments:      You were cared for by a hospitalist during your hospital stay. If you have any  questions about your discharge medications or the care you received while you were in the hospital after you are discharged, you can call the unit and asked to speak with the hospitalist on call if the  hospitalist that took care of you is not available. Once you are discharged, your primary care physician will handle any further medical issues. Please note that NO REFILLS for any discharge medications will be authorized once you are discharged, as it is imperative that you return to your primary care physician (or establish a relationship with a primary care physician if you do not have one) for your aftercare needs so that they can reassess your need for medications and monitor your lab values. If you do not have a primary care physician, you can call [phone] for a physician referral.    Heart Failure patients record your daily weight using the same scale at the same time of day  As directed     Increase activity slowly  As directed         Medication List         amLODipine 10 MG tablet  Commonly known as:  NORVASC  Take 10 mg by mouth daily.     aspirin 325 MG tablet  Take 1 tablet (325 mg total) by mouth daily.     carvedilol 3.125 MG tablet  Commonly known as:  COREG  Take 1 tablet (3.125 mg total) by mouth 2 (two) times daily.     cyanocobalamin 1000 MCG tablet  Take 1 tablet (1,000 mcg total) by mouth daily.     ezetimibe-simvastatin 10-40 MG per tablet  Commonly known as:  VYTORIN  Take 1 tablet by mouth at bedtime.     ferrous sulfate 325 (65 FE) MG tablet  Take 325 mg by mouth 2 (two) times daily.     FLUoxetine 10 MG capsule  Commonly known as:  PROZAC  Take 10 mg by mouth daily.     FORTEO [place]  Inject 20 mcg into the skin daily.     furosemide 40 MG tablet  Commonly known as:  LASIX  Take 1 tablet (40 mg total) by mouth 2 (two) times daily.     HYDROcodone-acetaminophen 5-325 MG per tablet  Commonly known as:  NORCO/VICODIN  Take 1 tablet by mouth every 4 (four) hours as needed.     insulin aspart 100 UNIT/ML injection  Commonly known as:  novoLOG  Inject 0-9 Units into the skin 3 (three) times daily with meals.     insulin detemir 100 UNIT/ML injection   Commonly known as:  LEVEMIR  Inject 18 Units into the skin at bedtime.     isosorbide mononitrate 30 MG 24 hr tablet  Commonly known as:  IMDUR  Take 1 tablet (30 mg total) by mouth daily.     multivitamin with minerals Tabs tablet  Take 1 tablet by mouth daily.     MYRBETRIQ 50 MG Tb24 tablet  Generic drug:  mirabegron ER  Take 50 mg by mouth Daily.       No Known Allergies    The results of significant diagnostics from this hospitalization (including imaging, microbiology, ancillary and laboratory) are listed below for reference.    Significant Diagnostic Studies: X-ray Chest Pa And Lateral  02/05/2013   *RADIOLOGY REPORT*  Clinical Data: Shortness of breath with cough and chest congestion.  CHEST - 2 VIEW  Comparison: 08/20/2012  Findings: AP and lateral views of the chest show low volumes with  cardiomegaly.  There is vascular congestion with bibasilar atelectasis or infiltrate.  Small bilateral pleural effusions are noted. Telemetry leads overlie the chest. Imaged bony structures of the thorax are intact.  IMPRESSION: Low volume film with cardiomegaly.  Bibasilar atelectasis/infiltrate with small bilateral pleural effusions.   Original Report Authenticated By: Kennith Center, M.D.    Microbiology: Recent Results (from the past 240 hour(s))  MRSA PCR SCREENING     Status: Abnormal   Collection Time    02/05/13  2:48 PM      Result Value Range Status   MRSA by PCR POSITIVE (*) NEGATIVE Final   Comment:            The GeneXpert MRSA Assay (FDA     approved for NASAL specimens     only), is one component of a     comprehensive MRSA colonization     surveillance program. It is not     intended to diagnose MRSA     infection nor to guide or     monitor treatment for     MRSA infections.     RESULT CALLED TO, READ BACK BY AND VERIFIED WITH:     K CAPES RN 2039 02/05/13 A NAVARRO     Labs: Basic Metabolic Panel:  Recent Labs Lab 02/02/13 1647 02/05/13 1518  02/06/13 0446 02/07/13 0403 02/08/13 0350  NA 136 142 139 139 138  K 5.4* 4.9 4.6 4.4 4.0  CL 101 103 103 100 97  CO2 26 29 29  33* 33*  GLUCOSE 158* 219* 117* 108* 150*  BUN 50* 50* 58* 62* 59*  CREATININE 1.33 1.39* 1.56* 1.50* 1.27  CALCIUM 9.0 9.0 8.9 8.9 9.2   Liver Function Tests:  Recent Labs Lab 02/05/13 1518 02/06/13 0446 02/07/13 0403  AST 14 13 12   ALT 23 22 20   ALKPHOS 71 61 59  BILITOT 0.3 0.6 0.4  PROT 5.9* 5.7* 5.5*  ALBUMIN 3.4* 3.4* 3.2*   No results found for this basename: LIPASE, AMYLASE,  in the last 168 hours No results found for this basename: AMMONIA,  in the last 168 hours CBC:  Recent Labs Lab 02/02/13 1317 02/05/13 1518 02/06/13 0446 02/07/13 0403 02/08/13 0350  WBC 6.0 7.3 6.1 5.9 7.0  NEUTROABS 2.9  --   --   --   --   HGB 7.5* 7.0* 7.6* 7.4* 9.6*  HCT 22.3* 21.5* 23.0* 22.8* 29.1*  MCV 95.5 97.7 95.8 96.6 93.3  PLT 158 188 147* 166 154   Cardiac Enzymes:  Recent Labs Lab 02/05/13 1518 02/05/13 2217 02/06/13 0446  CKTOTAL 29  --   --   CKMB 5.5*  --   --   TROPONINI 0.53* 0.48* 0.42*   BNP: BNP (last 3 results)  Recent Labs  08/20/12 0433 01/27/13 0823 02/05/13 1518  PROBNP 6364.0* 4580* 10787.0*   CBG:  Recent Labs Lab 02/06/13 2059 02/07/13 0749 02/07/13 1118 02/07/13 1648 02/07/13 2228  GLUCAP 154* 114* 141* 127* 147*       Signed:  Rhetta Mura  [facility] 02/08/2013, 8:09 AM

## 2013-02-08 NOTE — Assessment & Plan Note (Signed)
With anemia requiring transfusions;was tx 2 units 2 different days this admission. Outpatient visit with oncologist ordered and CBC in 3 days.

## 2013-02-08 NOTE — Assessment & Plan Note (Signed)
Pt will be followed by oncology as out pt; will probably need future transfusions;will continue iron and vit B12

## 2013-02-08 NOTE — Assessment & Plan Note (Signed)
Because of anemia will use daily ASA 81 mg only

## 2013-02-08 NOTE — Assessment & Plan Note (Signed)
Continue Levemir 18 units qHS; will use NH sliding scale every meal here

## 2013-02-09 ENCOUNTER — Ambulatory Visit: Payer: Medicare Other

## 2013-02-09 ENCOUNTER — Telehealth: Payer: Self-pay | Admitting: Oncology

## 2013-02-09 ENCOUNTER — Other Ambulatory Visit (HOSPITAL_BASED_OUTPATIENT_CLINIC_OR_DEPARTMENT_OTHER): Payer: Medicare Other | Admitting: Lab

## 2013-02-09 ENCOUNTER — Ambulatory Visit (HOSPITAL_BASED_OUTPATIENT_CLINIC_OR_DEPARTMENT_OTHER): Payer: Medicare Other | Admitting: Oncology

## 2013-02-09 VITALS — BP 144/54 | HR 79 | Temp 98.2°F | Resp 18 | Ht 66.0 in | Wt 181.1 lb

## 2013-02-09 DIAGNOSIS — D649 Anemia, unspecified: Secondary | ICD-10-CM

## 2013-02-09 DIAGNOSIS — D46C Myelodysplastic syndrome with isolated del(5q) chromosomal abnormality: Secondary | ICD-10-CM

## 2013-02-09 LAB — CBC WITH DIFFERENTIAL/PLATELET
Basophils Absolute: 0.1 10*3/uL (ref 0.0–0.1)
Eosinophils Absolute: 0.5 10*3/uL (ref 0.0–0.5)
HGB: 10.1 g/dL — ABNORMAL LOW (ref 13.0–17.1)
LYMPH%: 14 % (ref 14.0–49.0)
MCV: 94.3 fL (ref 79.3–98.0)
MONO#: 1.4 10*3/uL — ABNORMAL HIGH (ref 0.1–0.9)
MONO%: 16 % — ABNORMAL HIGH (ref 0.0–14.0)
NEUT#: 5.6 10*3/uL (ref 1.5–6.5)
Platelets: 147 10*3/uL (ref 140–400)
WBC: 8.9 10*3/uL (ref 4.0–10.3)

## 2013-02-09 LAB — BASIC METABOLIC PANEL (CC13)
BUN: 59.7 mg/dL — ABNORMAL HIGH (ref 7.0–26.0)
CO2: 29 mEq/L (ref 22–29)
Glucose: 213 mg/dl — ABNORMAL HIGH (ref 70–140)
Potassium: 4.1 mEq/L (ref 3.5–5.1)
Sodium: 143 mEq/L (ref 136–145)

## 2013-02-09 NOTE — Telephone Encounter (Signed)
gv and printed appt sched and avs for pt  °

## 2013-02-09 NOTE — Progress Notes (Signed)
   [facility]    OFFICE PROGRESS NOTE   INTERVAL HISTORY:   Aaron Bruce was admitted on 02/05/2013 with severe anemia and congestive heart failure. He was diuresed and transfused with packed red blood cells. He feels better. He was discharged yesterday. No specific complaint today.  Objective:  Vital signs in last 24 hours:  Blood pressure 144/54, pulse 79, temperature 98.2 F (36.8 C), temperature source Oral, resp. rate 18, height 5\' 6"  (1.676 m), weight 181 lb 1.6 oz (82.146 kg).   Resp: Decreased breath sounds with inspiratory rales at the lower chest bilaterally, no respiratory distress Cardio: Regular rate and rhythm, no JVD GI: No hepatosplenomegaly Vascular: No leg edema  Lab Results:  Lab Results  Component Value Date   WBC 8.9 02/09/2013   HGB 10.1* 02/09/2013   HCT 30.0* 02/09/2013   MCV 94.3 02/09/2013   PLT 147 02/09/2013   ANC 5.6  BUN 59.7, creatinine 1.3, potassium 4.1, CO2 29  Medications: I have reviewed the patient's current medications.  Assessment/Plan: 1. 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 He began Revlimid 11/30/2009. Revlimid was placed on hold 12/13/2009 when the absolute neutrophil count returned at 0.5. The Revlimid was resumed at a dose of 10 mg every other day on 12/21/2009. The anemia improved. Revlimid was decreased to 5 mg every other day 01/2010 due to neutropenia. Revlimid was discontinued following an office visit 02/28/2012 due to progressive anemia and leukopenia. Bone marrow biopsy on 04/16/2012 confirmed changes of myelodysplasia with no increase in blasts (5q-and 9:11 translocation). Revlimid resumed on 05/27/2012. Increased to 5 mg daily on 06/23/2012. Revlimid was discontinued during a hospitalization February 2014 due to continued severe anemia. He began a trial of weekly Procrit on 09/08/2012. He continues to require frequent red cell transfusions. The Procrit was discontinued 02/05/2013  . 2. Probable cellulitis of the left lower posterior leg 12/13/2009, resolved following treatment with ciprofloxacin.  3. Left lower extremity edema and pain 12/13/2009 with a negative venous Doppler. 4. History of hypertension. 5. Diabetes. 6. History of a cerebrovascular accident. 7. Hyperlipidemia. 8. Peripheral vascular disease. 9. History of neutropenia secondary to Revlimid. 10. History of Mild thrombocytopenia. Likely secondary to myelodysplasia. 11. Status post kyphoplasty L3 and L4 on 07/31/2011 for compression fractures. 12. Mildly elevated creatinine 06/08/2012. 13. Hospitalization 06/16/2013 through 08/24/2012 with dyspnea found to be in acute congestive heart failure related to severe anemia. 14. Dyspnea and clinical evidence of congestive heart failure requiring hospital admission 02/05/2013, improved following diuresis and red cell transfusions  Disposition:  Aaron Bruce appears much improved after the red cell transfusions and furosemide diuresis. He continues diuretic therapy. The BUN and creatinine are elevated today. The diuretic dose will need to be adjusted by his primary physician.  He continues to have severe transfusion-dependent anemia. The anemia has not responded to Revlimid or erythropoietin. I think you would be difficult for Aaron Bruce to tolerate 5-azacytidine therapy. The plan is to follow the CBC closely and transfused for a hemoglobin of less than 8.5. We will check a CBC weekly. He will return for an office visit in one month.   Thornton Papas, MD  02/09/2013  4:29 PM

## 2013-02-16 ENCOUNTER — Telehealth: Payer: Self-pay | Admitting: *Deleted

## 2013-02-16 ENCOUNTER — Other Ambulatory Visit: Payer: Self-pay | Admitting: *Deleted

## 2013-02-16 ENCOUNTER — Other Ambulatory Visit (HOSPITAL_BASED_OUTPATIENT_CLINIC_OR_DEPARTMENT_OTHER): Payer: Medicare Other | Admitting: Lab

## 2013-02-16 DIAGNOSIS — D46C Myelodysplastic syndrome with isolated del(5q) chromosomal abnormality: Secondary | ICD-10-CM

## 2013-02-16 LAB — CBC WITH DIFFERENTIAL/PLATELET
BASO%: 1.6 % (ref 0.0–2.0)
EOS%: 3 % (ref 0.0–7.0)
HCT: 25.9 % — ABNORMAL LOW (ref 38.4–49.9)
LYMPH%: 13.5 % — ABNORMAL LOW (ref 14.0–49.0)
MCH: 31.7 pg (ref 27.2–33.4)
MCHC: 33.7 g/dL (ref 32.0–36.0)
MCV: 94.1 fL (ref 79.3–98.0)
MONO%: 17.1 % — ABNORMAL HIGH (ref 0.0–14.0)
NEUT%: 64.8 % (ref 39.0–75.0)
Platelets: 150 10*3/uL (ref 140–400)
lymph#: 1.3 10*3/uL (ref 0.9–3.3)

## 2013-02-16 LAB — BASIC METABOLIC PANEL (CC13)
CO2: 28 mEq/L (ref 22–29)
Calcium: 9.1 mg/dL (ref 8.4–10.4)
Chloride: 99 mEq/L (ref 98–109)
Glucose: 235 mg/dl — ABNORMAL HIGH (ref 70–140)
Sodium: 138 mEq/L (ref 136–145)

## 2013-02-16 NOTE — Progress Notes (Signed)
Pt in for lab only visit. CBC reviewed by Dr. Truett Perna: Order received to arrange for transfusion next week. Orders entered.

## 2013-02-16 NOTE — Telephone Encounter (Signed)
Per POF and staff message from desk RN I have scheduled appts. I have called and spoke to [place] at [facility], I gave her appts.  JMW

## 2013-02-17 ENCOUNTER — Telehealth: Payer: Self-pay | Admitting: *Deleted

## 2013-02-17 NOTE — Telephone Encounter (Signed)
Called [facility], made them aware labs were faxed to MD attn.

## 2013-02-17 NOTE — Telephone Encounter (Signed)
Message copied by Caleb Popp on Wed Feb 17, 2013 11:00 AM ------      Message from: Ladene Artist      Created: Tue Feb 16, 2013  5:21 PM       Copy lab to [facility] MD, they need to consider adjusting lasix dose ------

## 2013-02-23 ENCOUNTER — Other Ambulatory Visit (HOSPITAL_BASED_OUTPATIENT_CLINIC_OR_DEPARTMENT_OTHER): Payer: Medicare Other | Admitting: Lab

## 2013-02-23 ENCOUNTER — Other Ambulatory Visit: Payer: Self-pay | Admitting: *Deleted

## 2013-02-23 DIAGNOSIS — D46C Myelodysplastic syndrome with isolated del(5q) chromosomal abnormality: Secondary | ICD-10-CM

## 2013-02-23 DIAGNOSIS — D649 Anemia, unspecified: Secondary | ICD-10-CM

## 2013-02-23 LAB — PREPARE RBC (CROSSMATCH)

## 2013-02-23 LAB — CBC WITH DIFFERENTIAL/PLATELET
Basophils Absolute: 0.1 10*3/uL (ref 0.0–0.1)
Eosinophils Absolute: 0.2 10*3/uL (ref 0.0–0.5)
HGB: 7.9 g/dL — ABNORMAL LOW (ref 13.0–17.1)
LYMPH%: 21.2 % (ref 14.0–49.0)
MCV: 98 fL (ref 79.3–98.0)
MONO%: 14.6 % — ABNORMAL HIGH (ref 0.0–14.0)
NEUT#: 4.9 10*3/uL (ref 1.5–6.5)
Platelets: 199 10*3/uL (ref 140–400)
RDW: 19.9 % — ABNORMAL HIGH (ref 11.0–14.6)

## 2013-02-24 ENCOUNTER — Ambulatory Visit (HOSPITAL_BASED_OUTPATIENT_CLINIC_OR_DEPARTMENT_OTHER): Payer: Medicare Other

## 2013-02-24 VITALS — BP 157/67 | HR 49 | Temp 98.7°F | Resp 20

## 2013-02-24 DIAGNOSIS — D46C Myelodysplastic syndrome with isolated del(5q) chromosomal abnormality: Secondary | ICD-10-CM

## 2013-02-24 MED ORDER — SODIUM CHLORIDE 0.9 % IV SOLN
250.0000 mL | Freq: Once | INTRAVENOUS | Status: AC
  Administered 2013-02-24: 250 mL via INTRAVENOUS

## 2013-02-24 MED ORDER — FUROSEMIDE 10 MG/ML IJ SOLN
40.0000 mg | Freq: Once | INTRAMUSCULAR | Status: AC
  Administered 2013-02-24: 40 mg via INTRAVENOUS

## 2013-02-24 NOTE — Progress Notes (Signed)
Notified Lonna Cobb of patient BP after first unit - Okay to administer 40 mg Lasix.

## 2013-02-24 NOTE — Patient Instructions (Addendum)
Cancer Center Discharge Instructions for Patients Receiving blood transfusion Today you received the following 2 units of blood tranfusion. To help prevent nausea and vomiting after your treatment, we encourage you to take your nausea medication  As per Dr. Truett Perna.  If you develop nausea and vomiting that is not controlled by your nausea medication, call the clinic.   BELOW ARE SYMPTOMS THAT SHOULD BE REPORTED IMMEDIATELY:  *FEVER GREATER THAN 100.5 F  *CHILLS WITH OR WITHOUT FEVER  NAUSEA AND VOMITING THAT IS NOT CONTROLLED WITH YOUR NAUSEA MEDICATION  *UNUSUAL SHORTNESS OF BREATH  *UNUSUAL BRUISING OR BLEEDING  TENDERNESS IN MOUTH AND THROAT WITH OR WITHOUT PRESENCE OF ULCERS  *URINARY PROBLEMS  *BOWEL PROBLEMS  UNUSUAL RASH Items with * indicate a potential emergency and should be followed up as soon as possible.  Feel free to call the clinic you have any questions or concerns. The clinic phone number is [phone].   Blood Transfusion Information WHAT IS A BLOOD TRANSFUSION? A transfusion is the replacement of blood or some of its parts. Blood is made up of multiple cells which provide different functions.  Red blood cells carry oxygen and are used for blood loss replacement.  White blood cells fight against infection.  Platelets control bleeding.  Plasma helps clot blood.  Other blood products are available for specialized needs, such as hemophilia or other clotting disorders. BEFORE THE TRANSFUSION  Who gives blood for transfusions?   You may be able to donate blood to be used at a later date on yourself (autologous donation).  Relatives can be asked to donate blood. This is generally not any safer than if you have received blood from a stranger. The same precautions are taken to ensure safety when a relative's blood is donated.  Healthy volunteers who are fully evaluated to make sure their blood is safe. This is blood bank blood. Transfusion  therapy is the safest it has ever been in the practice of medicine. Before blood is taken from a donor, a complete history is taken to make sure that person has no history of diseases nor engages in risky social behavior (examples are intravenous drug use or sexual activity with multiple partners). The donor's travel history is screened to minimize risk of transmitting infections, such as malaria. The donated blood is tested for signs of infectious diseases, such as HIV and hepatitis. The blood is then tested to be sure it is compatible with you in order to minimize the chance of a transfusion reaction. If you or a relative donates blood, this is often done in anticipation of surgery and is not appropriate for emergency situations. It takes many days to process the donated blood. RISKS AND COMPLICATIONS Although transfusion therapy is very safe and saves many lives, the main dangers of transfusion include:   Getting an infectious disease.  Developing a transfusion reaction. This is an allergic reaction to something in the blood you were given. Every precaution is taken to prevent this. The decision to have a blood transfusion has been considered carefully by your caregiver before blood is given. Blood is not given unless the benefits outweigh the risks. AFTER THE TRANSFUSION  Right after receiving a blood transfusion, you will usually feel much better and more energetic. This is especially true if your red blood cells have gotten low (anemic). The transfusion raises the level of the red blood cells which carry oxygen, and this usually causes an energy increase.  The nurse administering the transfusion will  monitor you carefully for complications. HOME CARE INSTRUCTIONS  No special instructions are needed after a transfusion. You may find your energy is better. Speak with your caregiver about any limitations on activity for underlying diseases you may have. SEEK MEDICAL CARE IF:   Your condition is  not improving after your transfusion.  You develop redness or irritation at the intravenous (IV) site. SEEK IMMEDIATE MEDICAL CARE IF:  Any of the following symptoms occur over the next 12 hours:  Shaking chills.  You have a temperature by mouth above 102 F (38.9 C), not controlled by medicine.  Chest, back, or muscle pain.  People around you feel you are not acting correctly or are confused.  Shortness of breath or difficulty breathing.  Dizziness and fainting.  You get a rash or develop hives.  You have a decrease in urine output.  Your urine turns a dark color or changes to pink, red, or brown. Any of the following symptoms occur over the next 10 days:  You have a temperature by mouth above 102 F (38.9 C), not controlled by medicine.  Shortness of breath.  Weakness after normal activity.  The white part of the eye turns yellow (jaundice).  You have a decrease in the amount of urine or are urinating less often.  Your urine turns a dark color or changes to pink, red, or brown. Document Released: 06/14/2000 Document Revised: 09/09/2011 Document Reviewed: 02/01/2008 [facility] Patient Information 2014 [place], [place].

## 2013-02-25 LAB — TYPE AND SCREEN: Unit division: 0

## 2013-03-02 ENCOUNTER — Other Ambulatory Visit (HOSPITAL_BASED_OUTPATIENT_CLINIC_OR_DEPARTMENT_OTHER): Payer: Medicare Other

## 2013-03-02 ENCOUNTER — Encounter (HOSPITAL_COMMUNITY): Payer: Medicare Other

## 2013-03-02 ENCOUNTER — Other Ambulatory Visit: Payer: Self-pay | Admitting: *Deleted

## 2013-03-02 ENCOUNTER — Encounter (HOSPITAL_COMMUNITY)
Admission: RE | Admit: 2013-03-02 | Discharge: 2013-03-02 | Disposition: A | Discharge status: Home / Self Care | Payer: Medicare Other | Source: Ambulatory Visit | Attending: Oncology | Admitting: Oncology

## 2013-03-02 DIAGNOSIS — D46C Myelodysplastic syndrome with isolated del(5q) chromosomal abnormality: Secondary | ICD-10-CM

## 2013-03-02 DIAGNOSIS — D649 Anemia, unspecified: Secondary | ICD-10-CM | POA: Insufficient documentation

## 2013-03-02 LAB — CBC WITH DIFFERENTIAL/PLATELET
Basophils Absolute: 0.1 10*3/uL (ref 0.0–0.1)
EOS%: 2.4 % (ref 0.0–7.0)
HGB: 8.5 g/dL — ABNORMAL LOW (ref 13.0–17.1)
MCH: 31 pg (ref 27.2–33.4)
NEUT#: 5.3 10*3/uL (ref 1.5–6.5)
RDW: 18.2 % — ABNORMAL HIGH (ref 11.0–14.6)
WBC: 8.7 10*3/uL (ref 4.0–10.3)
lymph#: 1.9 10*3/uL (ref 0.9–3.3)

## 2013-03-03 ENCOUNTER — Other Ambulatory Visit: Payer: Self-pay | Admitting: *Deleted

## 2013-03-03 ENCOUNTER — Telehealth: Payer: Self-pay | Admitting: *Deleted

## 2013-03-03 NOTE — Telephone Encounter (Signed)
I called and spoke with [facility] regarding his appt for Friday

## 2013-03-03 NOTE — Telephone Encounter (Signed)
I have called and spoke with transportation for his appt on Friday

## 2013-03-05 ENCOUNTER — Ambulatory Visit (HOSPITAL_BASED_OUTPATIENT_CLINIC_OR_DEPARTMENT_OTHER): Payer: Medicare Other

## 2013-03-05 VITALS — BP 159/67 | HR 71 | Temp 98.8°F | Resp 18

## 2013-03-05 DIAGNOSIS — D649 Anemia, unspecified: Secondary | ICD-10-CM

## 2013-03-05 MED ORDER — FUROSEMIDE 10 MG/ML IJ SOLN
20.0000 mg | Freq: Once | INTRAMUSCULAR | Status: AC
  Administered 2013-03-05: 20 mg via INTRAVENOUS

## 2013-03-05 MED ORDER — SODIUM CHLORIDE 0.9 % IV SOLN
250.0000 mL | Freq: Once | INTRAVENOUS | Status: AC
  Administered 2013-03-05: 250 mL via INTRAVENOUS

## 2013-03-05 NOTE — Patient Instructions (Signed)
Blood Transfusion  A blood transfusion replaces your blood or some of its parts. Blood is replaced when you have lost blood because of surgery, an accident, or for severe blood conditions like anemia. You can donate blood to be used on yourself if you have a planned surgery. If you lose blood during that surgery, your own blood can be given back to you. Any blood given to you is checked to make sure it matches your blood type. Your temperature, blood pressure, and heart rate (vital signs) will be checked often.  GET HELP RIGHT AWAY IF:   You feel sick to your stomach (nauseous) or throw up (vomit).  You have watery poop (diarrhea).  You have shortness of breath or trouble breathing.  You have blood in your pee (urine) or have dark colored pee.  You have chest pain or tightness.  Your eyes or skin turn yellow (jaundice).  You have a temperature by mouth above 102 F (38.9 C), not controlled by medicine.  You start to shake and have chills.  You develop a a red rash (hives) or feel itchy.  You develop lightheadedness or feel confused.  You develop back, joint, or muscle pain.  You do not feel hungry (lost appetite).  You feel tired, restless, or nervous.  You develop belly (abdominal) cramps. Document Released: 09/13/2008 Document Revised: 09/09/2011 Document Reviewed: 09/13/2008 ExitCare Patient Information 2014 [employer].  

## 2013-03-06 LAB — TYPE AND SCREEN
ABO/RH(D): O POS
Antibody Screen: NEGATIVE
Unit division: 0
Unit division: 0

## 2013-03-09 ENCOUNTER — Other Ambulatory Visit (HOSPITAL_BASED_OUTPATIENT_CLINIC_OR_DEPARTMENT_OTHER): Payer: Medicare Other | Admitting: Lab

## 2013-03-09 ENCOUNTER — Ambulatory Visit (HOSPITAL_BASED_OUTPATIENT_CLINIC_OR_DEPARTMENT_OTHER): Payer: Medicare Other | Admitting: Nurse Practitioner

## 2013-03-09 ENCOUNTER — Telehealth: Payer: Self-pay | Admitting: Oncology

## 2013-03-09 VITALS — BP 130/53 | HR 73 | Temp 98.3°F | Resp 18 | Ht 66.0 in | Wt 172.9 lb

## 2013-03-09 DIAGNOSIS — D649 Anemia, unspecified: Secondary | ICD-10-CM

## 2013-03-09 DIAGNOSIS — D46C Myelodysplastic syndrome with isolated del(5q) chromosomal abnormality: Secondary | ICD-10-CM

## 2013-03-09 LAB — CBC WITH DIFFERENTIAL/PLATELET
Basophils Absolute: 0.1 10*3/uL (ref 0.0–0.1)
EOS%: 3.8 % (ref 0.0–7.0)
HCT: 30.2 % — ABNORMAL LOW (ref 38.4–49.9)
HGB: 9.8 g/dL — ABNORMAL LOW (ref 13.0–17.1)
MCH: 30 pg (ref 27.2–33.4)
MCV: 92.4 fL (ref 79.3–98.0)
MONO%: 11 % (ref 0.0–14.0)
NEUT%: 53.6 % (ref 39.0–75.0)

## 2013-03-09 NOTE — Telephone Encounter (Signed)
gv and printed appt sched and avs for pt for SEpt Oct adn NOV

## 2013-03-09 NOTE — Progress Notes (Signed)
OFFICE PROGRESS NOTE  Interval history:  Aaron Bruce returns as scheduled. The hemoglobin returned at 8.5 on 03/02/2013. He was transfused 2 units of blood on 03/05/2013. He notes no significant change in his overall condition since the blood transfusion. He denies shortness of breath. No chest pain. He denies bleeding. He has a good appetite.   Objective: Blood pressure 130/53, pulse 73, temperature 98.3 F (36.8 C), temperature source Oral, resp. rate 18, height 5\' 6"  (1.676 m), weight 172 lb 14.4 oz (78.427 kg).  No thrush. Tongue appears moist. Lungs are clear. Regular cardiac rhythm. Abdomen soft. No organomegaly. No leg edema. Calves nontender.  Lab Results: Lab Results  Component Value Date   WBC 5.8 03/09/2013   HGB 9.8* 03/09/2013   HCT 30.2* 03/09/2013   MCV 92.4 03/09/2013   PLT 112* 03/09/2013    Chemistry:    Chemistry      Component Value Date/Time   NA 138 02/16/2013 1414   NA 138 02/08/2013 0350   K 4.7 02/16/2013 1414   K 4.0 02/08/2013 0350   CL 97 02/08/2013 0350   CL 105 07/28/2012 1507   CO2 28 02/16/2013 1414   CO2 33* 02/08/2013 0350   BUN 66.7* 02/16/2013 1414   BUN 59* 02/08/2013 0350   CREATININE 1.6* 02/16/2013 1414   CREATININE 1.27 02/08/2013 0350      Component Value Date/Time   CALCIUM 9.1 02/16/2013 1414   CALCIUM 9.2 02/08/2013 0350   ALKPHOS 59 02/07/2013 0403   ALKPHOS 67 01/27/2013 0823   AST 12 02/07/2013 0403   AST 14 01/27/2013 0823   ALT 20 02/07/2013 0403   ALT 26 01/27/2013 0823   BILITOT 0.4 02/07/2013 0403   BILITOT 0.73 01/27/2013 0823       Studies/Results: No results found.  Medications: I have reviewed the patient's current medications.  Assessment/Plan:  1. 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 He began Revlimid 11/30/2009. Revlimid was placed on hold 12/13/2009 when the absolute neutrophil count returned at 0.5. The Revlimid was resumed at a dose of 10 mg every other day on 12/21/2009. The anemia improved.  Revlimid was decreased to 5 mg every other day 01/2010 due to neutropenia. Revlimid was discontinued following an office visit 02/28/2012 due to progressive anemia and leukopenia. Bone marrow biopsy on 04/16/2012 confirmed changes of myelodysplasia with no increase in blasts (5q-and 9:11 translocation). Revlimid resumed on 05/27/2012. Increased to 5 mg daily on 06/23/2012. Revlimid was discontinued during a hospitalization February 2014 due to continued severe anemia. He began a trial of weekly Procrit on 09/08/2012. He continues to require frequent red cell transfusions. The Procrit was discontinued 02/05/2013 . 2. Probable cellulitis of the left lower posterior leg 12/13/2009, resolved following treatment with ciprofloxacin.  3. Left lower extremity edema and pain 12/13/2009 with a negative venous Doppler. 4. History of hypertension. 5. Diabetes. 6. History of a cerebrovascular accident. 7. Hyperlipidemia. 8. Peripheral vascular disease. 9. History of neutropenia secondary to Revlimid. 10. History of mild thrombocytopenia. Likely secondary to myelodysplasia. 11. Status post kyphoplasty L3 and L4 on 07/31/2011 for compression fractures. 12. Mildly elevated creatinine 06/08/2012. 13. Hospitalization 06/16/2013 through 08/24/2012 with dyspnea found to be in acute congestive heart failure related to severe anemia. 14. Dyspnea and clinical evidence of congestive heart failure requiring hospital admission 02/05/2013, improved following diuresis and red cell transfusions  Disposition-Mr. Price appears stable. We will continue to check labs on a weekly schedule and provide red cell transfusion support as  needed. He will return for a followup visit in one month.  Plan reviewed with Dr. Truett Perna.  Lonna Cobb ANP/GNP-BC

## 2013-03-16 ENCOUNTER — Other Ambulatory Visit (HOSPITAL_BASED_OUTPATIENT_CLINIC_OR_DEPARTMENT_OTHER): Payer: Medicare Other

## 2013-03-16 DIAGNOSIS — D649 Anemia, unspecified: Secondary | ICD-10-CM

## 2013-03-16 DIAGNOSIS — D46C Myelodysplastic syndrome with isolated del(5q) chromosomal abnormality: Secondary | ICD-10-CM

## 2013-03-16 LAB — CBC WITH DIFFERENTIAL/PLATELET
BASO%: 1.4 % (ref 0.0–2.0)
EOS%: 2.9 % (ref 0.0–7.0)
HCT: 26.5 % — ABNORMAL LOW (ref 38.4–49.9)
MCH: 30.4 pg (ref 27.2–33.4)
MCHC: 33.6 g/dL (ref 32.0–36.0)
MCV: 90.6 fL (ref 79.3–98.0)
MONO%: 15.4 % — ABNORMAL HIGH (ref 0.0–14.0)
NEUT%: 43.9 % (ref 39.0–75.0)
lymph#: 1.9 10*3/uL (ref 0.9–3.3)

## 2013-03-16 LAB — BASIC METABOLIC PANEL (CC13)
CO2: 29 mEq/L (ref 22–29)
Calcium: 9.2 mg/dL (ref 8.4–10.4)
Chloride: 102 mEq/L (ref 98–109)
Creatinine: 1.6 mg/dL — ABNORMAL HIGH (ref 0.7–1.3)
Glucose: 191 mg/dl — ABNORMAL HIGH (ref 70–140)
Sodium: 140 mEq/L (ref 136–145)

## 2013-03-17 ENCOUNTER — Telehealth: Payer: Self-pay | Admitting: *Deleted

## 2013-03-17 ENCOUNTER — Other Ambulatory Visit: Payer: Self-pay | Admitting: *Deleted

## 2013-03-17 DIAGNOSIS — D46C Myelodysplastic syndrome with isolated del(5q) chromosomal abnormality: Secondary | ICD-10-CM

## 2013-03-17 NOTE — Telephone Encounter (Signed)
Labs faxed to [facility] attn: MD. Sherron Monday with Jola Babinski, pt's nurse to make her aware BUN and creatinine are elevated. She stated labs will be taken to MD.

## 2013-03-17 NOTE — Telephone Encounter (Signed)
Message copied by Caleb Popp on Wed Mar 17, 2013  3:08 PM ------      Message from: Thornton Papas B      Created: Wed Mar 17, 2013  2:56 PM       Copy to primary MD, ?dehydrated, ? Decrease lasix            bmet next visit ------

## 2013-03-23 ENCOUNTER — Other Ambulatory Visit: Payer: Self-pay | Admitting: *Deleted

## 2013-03-23 ENCOUNTER — Ambulatory Visit (HOSPITAL_BASED_OUTPATIENT_CLINIC_OR_DEPARTMENT_OTHER): Payer: Medicare Other

## 2013-03-23 ENCOUNTER — Other Ambulatory Visit (HOSPITAL_BASED_OUTPATIENT_CLINIC_OR_DEPARTMENT_OTHER): Payer: Medicare Other | Admitting: Lab

## 2013-03-23 VITALS — BP 130/49 | HR 49 | Temp 98.2°F | Resp 20

## 2013-03-23 DIAGNOSIS — D649 Anemia, unspecified: Secondary | ICD-10-CM

## 2013-03-23 DIAGNOSIS — D46C Myelodysplastic syndrome with isolated del(5q) chromosomal abnormality: Secondary | ICD-10-CM

## 2013-03-23 LAB — CBC WITH DIFFERENTIAL/PLATELET
BASO%: 1 % (ref 0.0–2.0)
EOS%: 2.1 % (ref 0.0–7.0)
LYMPH%: 16 % (ref 14.0–49.0)
MCH: 30.4 pg (ref 27.2–33.4)
MCHC: 33.1 g/dL (ref 32.0–36.0)
MCV: 91.9 fL (ref 79.3–98.0)
MONO%: 16.4 % — ABNORMAL HIGH (ref 0.0–14.0)
NEUT#: 5.4 10*3/uL (ref 1.5–6.5)
Platelets: 116 10*3/uL — ABNORMAL LOW (ref 140–400)
RBC: 2.39 10*6/uL — ABNORMAL LOW (ref 4.20–5.82)
RDW: 17.7 % — ABNORMAL HIGH (ref 11.0–14.6)

## 2013-03-23 LAB — HOLD TUBE, BLOOD BANK

## 2013-03-23 MED ORDER — FUROSEMIDE 10 MG/ML IJ SOLN: 20.0000 mg | Freq: Once | INTRAMUSCULAR | Status: DC

## 2013-03-23 MED ORDER — SODIUM CHLORIDE 0.9 % IV SOLN
250.0000 mL | Freq: Once | INTRAVENOUS | Status: AC
  Administered 2013-03-23: 250 mL via INTRAVENOUS

## 2013-03-23 NOTE — Patient Instructions (Addendum)
Blood Transfusion Information WHAT IS A BLOOD TRANSFUSION? A transfusion is the replacement of blood or some of its parts. Blood is made up of multiple cells which provide different functions.  Red blood cells carry oxygen and are used for blood loss replacement.  White blood cells fight against infection.  Platelets control bleeding.  Plasma helps clot blood.  Other blood products are available for specialized needs, such as hemophilia or other clotting disorders. BEFORE THE TRANSFUSION  Who gives blood for transfusions?   You may be able to donate blood to be used at a later date on yourself (autologous donation).  Relatives can be asked to donate blood. This is generally not any safer than if you have received blood from a stranger. The same precautions are taken to ensure safety when a relative's blood is donated.  Healthy volunteers who are fully evaluated to make sure their blood is safe. This is blood bank blood. Transfusion therapy is the safest it has ever been in the practice of medicine. Before blood is taken from a donor, a complete history is taken to make sure that person has no history of diseases nor engages in risky social behavior (examples are intravenous drug use or sexual activity with multiple partners). The donor's travel history is screened to minimize risk of transmitting infections, such as malaria. The donated blood is tested for signs of infectious diseases, such as HIV and hepatitis. The blood is then tested to be sure it is compatible with you in order to minimize the chance of a transfusion reaction. If you or a relative donates blood, this is often done in anticipation of surgery and is not appropriate for emergency situations. It takes many days to process the donated blood. RISKS AND COMPLICATIONS Although transfusion therapy is very safe and saves many lives, the main dangers of transfusion include:   Getting an infectious disease.  Developing a  transfusion reaction. This is an allergic reaction to something in the blood you were given. Every precaution is taken to prevent this. The decision to have a blood transfusion has been considered carefully by your caregiver before blood is given. Blood is not given unless the benefits outweigh the risks. AFTER THE TRANSFUSION  Right after receiving a blood transfusion, you will usually feel much better and more energetic. This is especially true if your red blood cells have gotten low (anemic). The transfusion raises the level of the red blood cells which carry oxygen, and this usually causes an energy increase.  The nurse administering the transfusion will monitor you carefully for complications. HOME CARE INSTRUCTIONS  No special instructions are needed after a transfusion. You may find your energy is better. Speak with your caregiver about any limitations on activity for underlying diseases you may have. SEEK MEDICAL CARE IF:   Your condition is not improving after your transfusion.  You develop redness or irritation at the intravenous (IV) site. SEEK IMMEDIATE MEDICAL CARE IF:  Any of the following symptoms occur over the next 12 hours:  Shaking chills.  You have a temperature by mouth above 102 F (38.9 C), not controlled by medicine.  Chest, back, or muscle pain.  People around you feel you are not acting correctly or are confused.  Shortness of breath or difficulty breathing.  Dizziness and fainting.  You get a rash or develop hives.  You have a decrease in urine output.  Your urine turns a dark color or changes to pink, red, or brown. Any of the following   symptoms occur over the next 10 days:  You have a temperature by mouth above 102 F (38.9 C), not controlled by medicine.  Shortness of breath.  Weakness after normal activity.  The white part of the eye turns yellow (jaundice).  You have a decrease in the amount of urine or are urinating less often.  Your  urine turns a dark color or changes to pink, red, or brown. Document Released: 06/14/2000 Document Revised: 09/09/2011 Document Reviewed: 02/01/2008 Exit[employer] Patient Information 2014 [employer].  

## 2013-03-24 ENCOUNTER — Ambulatory Visit (HOSPITAL_BASED_OUTPATIENT_CLINIC_OR_DEPARTMENT_OTHER): Payer: Medicare Other

## 2013-03-24 VITALS — BP 158/63 | HR 48 | Temp 97.9°F | Resp 22

## 2013-03-24 DIAGNOSIS — D649 Anemia, unspecified: Secondary | ICD-10-CM

## 2013-03-24 MED ORDER — FUROSEMIDE 10 MG/ML IJ SOLN
20.0000 mg | Freq: Once | INTRAMUSCULAR | Status: AC
  Administered 2013-03-24: 20 mg via INTRAVENOUS

## 2013-03-24 MED ORDER — SODIUM CHLORIDE 0.9 % IV SOLN
250.0000 mL | Freq: Once | INTRAVENOUS | Status: AC
  Administered 2013-03-24: 250 mL via INTRAVENOUS

## 2013-03-24 NOTE — Patient Instructions (Addendum)
Blood Transfusion Information WHAT IS A BLOOD TRANSFUSION? A transfusion is the replacement of blood or some of its parts. Blood is made up of multiple cells which provide different functions.  Red blood cells carry oxygen and are used for blood loss replacement.  White blood cells fight against infection.  Platelets control bleeding.  Plasma helps clot blood.  Other blood products are available for specialized needs, such as hemophilia or other clotting disorders. BEFORE THE TRANSFUSION  Who gives blood for transfusions?   You may be able to donate blood to be used at a later date on yourself (autologous donation).  Relatives can be asked to donate blood. This is generally not any safer than if you have received blood from a stranger. The same precautions are taken to ensure safety when a relative's blood is donated.  Healthy volunteers who are fully evaluated to make sure their blood is safe. This is blood bank blood. Transfusion therapy is the safest it has ever been in the practice of medicine. Before blood is taken from a donor, a complete history is taken to make sure that person has no history of diseases nor engages in risky social behavior (examples are intravenous drug use or sexual activity with multiple partners). The donor's travel history is screened to minimize risk of transmitting infections, such as malaria. The donated blood is tested for signs of infectious diseases, such as HIV and hepatitis. The blood is then tested to be sure it is compatible with you in order to minimize the chance of a transfusion reaction. If you or a relative donates blood, this is often done in anticipation of surgery and is not appropriate for emergency situations. It takes many days to process the donated blood. RISKS AND COMPLICATIONS Although transfusion therapy is very safe and saves many lives, the main dangers of transfusion include:   Getting an infectious disease.  Developing a  transfusion reaction. This is an allergic reaction to something in the blood you were given. Every precaution is taken to prevent this. The decision to have a blood transfusion has been considered carefully by your caregiver before blood is given. Blood is not given unless the benefits outweigh the risks. AFTER THE TRANSFUSION  Right after receiving a blood transfusion, you will usually feel much better and more energetic. This is especially true if your red blood cells have gotten low (anemic). The transfusion raises the level of the red blood cells which carry oxygen, and this usually causes an energy increase.  The nurse administering the transfusion will monitor you carefully for complications. HOME CARE INSTRUCTIONS  No special instructions are needed after a transfusion. You may find your energy is better. Speak with your caregiver about any limitations on activity for underlying diseases you may have. SEEK MEDICAL CARE IF:   Your condition is not improving after your transfusion.  You develop redness or irritation at the intravenous (IV) site. SEEK IMMEDIATE MEDICAL CARE IF:  Any of the following symptoms occur over the next 12 hours:  Shaking chills.  You have a temperature by mouth above 102 F (38.9 C), not controlled by medicine.  Chest, back, or muscle pain.  People around you feel you are not acting correctly or are confused.  Shortness of breath or difficulty breathing.  Dizziness and fainting.  You get a rash or develop hives.  You have a decrease in urine output.  Your urine turns a dark color or changes to pink, red, or brown. Any of the following   symptoms occur over the next 10 days:  You have a temperature by mouth above 102 F (38.9 C), not controlled by medicine.  Shortness of breath.  Weakness after normal activity.  The white part of the eye turns yellow (jaundice).  You have a decrease in the amount of urine or are urinating less often.  Your  urine turns a dark color or changes to pink, red, or brown. Document Released: 06/14/2000 Document Revised: 09/09/2011 Document Reviewed: 02/01/2008 Exit[employer] Patient Information 2014 [employer].  

## 2013-03-25 LAB — TYPE AND SCREEN
ABO/RH(D): O POS
Antibody Screen: NEGATIVE
Unit division: 0
Unit division: 0

## 2013-03-30 ENCOUNTER — Other Ambulatory Visit (HOSPITAL_BASED_OUTPATIENT_CLINIC_OR_DEPARTMENT_OTHER): Payer: Medicare Other | Admitting: Lab

## 2013-03-30 ENCOUNTER — Other Ambulatory Visit: Payer: Self-pay | Admitting: *Deleted

## 2013-03-30 ENCOUNTER — Ambulatory Visit (HOSPITAL_BASED_OUTPATIENT_CLINIC_OR_DEPARTMENT_OTHER): Payer: Medicare Other

## 2013-03-30 VITALS — BP 145/47 | HR 47 | Temp 98.1°F | Resp 20

## 2013-03-30 DIAGNOSIS — D46C Myelodysplastic syndrome with isolated del(5q) chromosomal abnormality: Secondary | ICD-10-CM

## 2013-03-30 DIAGNOSIS — D649 Anemia, unspecified: Secondary | ICD-10-CM

## 2013-03-30 LAB — CBC WITH DIFFERENTIAL/PLATELET
Basophils Absolute: 0.1 10*3/uL (ref 0.0–0.1)
EOS%: 4.7 % (ref 0.0–7.0)
HCT: 23.5 % — ABNORMAL LOW (ref 38.4–49.9)
HGB: 7.9 g/dL — ABNORMAL LOW (ref 13.0–17.1)
MCH: 29.7 pg (ref 27.2–33.4)
MCHC: 33.6 g/dL (ref 32.0–36.0)
MCV: 88.5 fL (ref 79.3–98.0)
MONO#: 0.6 10*3/uL (ref 0.1–0.9)
NEUT#: 2.3 10*3/uL (ref 1.5–6.5)
NEUT%: 46.8 % (ref 39.0–75.0)
RDW: 17.6 % — ABNORMAL HIGH (ref 11.0–14.6)
WBC: 4.8 10*3/uL (ref 4.0–10.3)
lymph#: 1.6 10*3/uL (ref 0.9–3.3)

## 2013-03-30 LAB — PREPARE RBC (CROSSMATCH)

## 2013-03-30 MED ORDER — SODIUM CHLORIDE 0.9 % IV SOLN: 250.0000 mL | Freq: Once | INTRAVENOUS | Status: DC

## 2013-03-30 MED ORDER — SODIUM CHLORIDE 0.9 % IJ SOLN
10.0000 mL | INTRAMUSCULAR | Status: DC | PRN
  Filled 2013-03-30: qty 10

## 2013-03-30 MED ORDER — FUROSEMIDE 10 MG/ML IJ SOLN
20.0000 mg | Freq: Once | INTRAMUSCULAR | Status: AC
  Administered 2013-03-30: 20 mg via INTRAVENOUS

## 2013-03-31 ENCOUNTER — Ambulatory Visit (HOSPITAL_COMMUNITY)
Admission: RE | Admit: 2013-03-31 | Discharge: 2013-03-31 | Disposition: A | Discharge status: Home / Self Care | Payer: Medicare Other | Source: Ambulatory Visit | Attending: Oncology | Admitting: Oncology

## 2013-03-31 ENCOUNTER — Ambulatory Visit (HOSPITAL_BASED_OUTPATIENT_CLINIC_OR_DEPARTMENT_OTHER): Payer: Medicare Other

## 2013-03-31 ENCOUNTER — Ambulatory Visit (HOSPITAL_COMMUNITY)
Admission: RE | Admit: 2013-03-31 | Discharge: 2013-03-31 | Disposition: A | Discharge status: Home / Self Care | Payer: Medicare Other | Source: Ambulatory Visit

## 2013-03-31 VITALS — BP 156/45 | HR 50 | Temp 98.4°F | Resp 20

## 2013-03-31 DIAGNOSIS — D649 Anemia, unspecified: Secondary | ICD-10-CM

## 2013-03-31 MED ORDER — FUROSEMIDE 10 MG/ML IJ SOLN
20.0000 mg | Freq: Once | INTRAMUSCULAR | Status: AC
  Administered 2013-03-31: 20 mg via INTRAMUSCULAR

## 2013-03-31 MED ORDER — SODIUM CHLORIDE 0.9 % IV SOLN
Freq: Once | INTRAVENOUS | Status: AC
  Administered 2013-03-31: 15:00:00 via INTRAVENOUS

## 2013-04-01 LAB — TYPE AND SCREEN
ABO/RH(D): O POS
Antibody Screen: NEGATIVE

## 2013-04-06 ENCOUNTER — Ambulatory Visit (HOSPITAL_BASED_OUTPATIENT_CLINIC_OR_DEPARTMENT_OTHER): Payer: Medicare Other | Admitting: Nurse Practitioner

## 2013-04-06 ENCOUNTER — Telehealth: Payer: Self-pay | Admitting: Oncology

## 2013-04-06 ENCOUNTER — Other Ambulatory Visit (HOSPITAL_BASED_OUTPATIENT_CLINIC_OR_DEPARTMENT_OTHER): Payer: Medicare Other | Admitting: Lab

## 2013-04-06 VITALS — BP 182/61 | HR 57 | Temp 99.0°F | Resp 22 | Ht 66.0 in | Wt 190.4 lb

## 2013-04-06 DIAGNOSIS — D649 Anemia, unspecified: Secondary | ICD-10-CM

## 2013-04-06 DIAGNOSIS — D46C Myelodysplastic syndrome with isolated del(5q) chromosomal abnormality: Secondary | ICD-10-CM

## 2013-04-06 LAB — CBC WITH DIFFERENTIAL/PLATELET
BASO%: 2.1 % — ABNORMAL HIGH (ref 0.0–2.0)
EOS%: 3.4 % (ref 0.0–7.0)
HCT: 26 % — ABNORMAL LOW (ref 38.4–49.9)
HGB: 8.5 g/dL — ABNORMAL LOW (ref 13.0–17.1)
LYMPH%: 33.5 % (ref 14.0–49.0)
MCH: 29 pg (ref 27.2–33.4)
MCHC: 32.8 g/dL (ref 32.0–36.0)
NEUT#: 2.4 10*3/uL (ref 1.5–6.5)
NEUT%: 47 % (ref 39.0–75.0)
RBC: 2.93 10*6/uL — ABNORMAL LOW (ref 4.20–5.82)
RDW: 16.8 % — ABNORMAL HIGH (ref 11.0–14.6)
WBC: 5.1 10*3/uL (ref 4.0–10.3)
lymph#: 1.7 10*3/uL (ref 0.9–3.3)

## 2013-04-06 LAB — HOLD TUBE, BLOOD BANK

## 2013-04-06 NOTE — Progress Notes (Signed)
OFFICE PROGRESS NOTE  Interval history:  [name] is an 79 year old man with MDS (5Q minus). He is transfusion dependent for red cells. He was last transfused on 03/31/2013 for a hemoglobin of 7.9.  He is seen today for scheduled followup.  He did not note significant improvement in his overall condition following the blood transfusion. He has intermittent shortness of breath. He has occasional cough. No chest pain. He denies fever. No bleeding. No unusual bruising. He denies any fever or chills. Bowels moving regularly. No hematuria or dysuria. Overall good appetite. He notes that he is continuing to gain weight. He denies pain except related to intermittent leg cramps.   Objective: Blood pressure 182/61, pulse 57, temperature 99 F (37.2 C), temperature source Oral, resp. rate 22, height 5\' 6"  (1.676 m), weight 190 lb 6.4 oz (86.365 kg), SpO2 97.00%.  Oropharynx is without thrush or ulceration. Lungs are clear. No wheezes or rales. Regular cardiac rhythm. Abdomen is soft. No organomegaly. Pitting lower leg edema bilaterally. A few ecchymoses on the forearms. He is alert. Follows commands.  Lab Results: Lab Results  Component Value Date   WBC 5.1 04/06/2013   HGB 8.5* 04/06/2013   HCT 26.0* 04/06/2013   MCV 88.7 04/06/2013   PLT 90* 04/06/2013    Chemistry:    Chemistry      Component Value Date/Time   NA 140 03/16/2013 1411   NA 138 02/08/2013 0350   K 5.2* 03/16/2013 1411   K 4.0 02/08/2013 0350   CL 97 02/08/2013 0350   CL 105 07/28/2012 1507   CO2 29 03/16/2013 1411   CO2 33* 02/08/2013 0350   BUN 95.7* 03/16/2013 1411   BUN 59* 02/08/2013 0350   CREATININE 1.6* 03/16/2013 1411   CREATININE 1.27 02/08/2013 0350      Component Value Date/Time   CALCIUM 9.2 03/16/2013 1411   CALCIUM 9.2 02/08/2013 0350   ALKPHOS 59 02/07/2013 0403   ALKPHOS 67 01/27/2013 0823   AST 12 02/07/2013 0403   AST 14 01/27/2013 0823   ALT 20 02/07/2013 0403   ALT 26 01/27/2013 0823   BILITOT 0.4 02/07/2013 0403    BILITOT 0.73 01/27/2013 0823       Studies/Results: No results found.  Medications: He did not bring a current medication list to today's visit. He is unsure of what medications he is taking. The medication list in our system was reviewed.  Assessment/Plan:  1. 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 He began Revlimid 11/30/2009. Revlimid was placed on hold 12/13/2009 when the absolute neutrophil count returned at 0.5. The Revlimid was resumed at a dose of 10 mg every other day on 12/21/2009. The anemia improved. Revlimid was decreased to 5 mg every other day 01/2010 due to neutropenia. Revlimid was discontinued following an office visit 02/28/2012 due to progressive anemia and leukopenia. Bone marrow biopsy on 04/16/2012 confirmed changes of myelodysplasia with no increase in blasts (5q-and 9:11 translocation). Revlimid resumed on 05/27/2012. Increased to 5 mg daily on 06/23/2012. Revlimid was discontinued during a hospitalization February 2014 due to continued severe anemia. He began a trial of weekly Procrit on 09/08/2012. He continues to require frequent red cell transfusions. The Procrit was discontinued 02/05/2013. 2. Probable cellulitis of the left lower posterior leg 12/13/2009, resolved following treatment with ciprofloxacin.  3. Left lower extremity edema and pain 12/13/2009 with a negative venous Doppler. 4. History of hypertension. 5. Diabetes. 6. History of a cerebrovascular accident. 7. Hyperlipidemia. 8. Peripheral vascular  disease. 9. History of neutropenia secondary to Revlimid. 10. History of mild thrombocytopenia. Likely secondary to myelodysplasia. 11. Status post kyphoplasty L3 and L4 on 07/31/2011 for compression fractures. 12. Mildly elevated creatinine 06/08/2012. 13. Hospitalization 06/16/2013 through 08/24/2012 with dyspnea found to be in acute congestive heart failure related to severe anemia. 14. Dyspnea and clinical evidence of  congestive heart failure requiring hospital admission 02/05/2013, improved following diuresis and red cell transfusions 15. Weight gain. He reports the nursing facility is aware of the increase in his weight.  Disposition-Aaron Bruce appears stable. He was last transfused 03/31/2013. We will continue to check weekly CBCs and provide red cell transfusion support as needed.  We discussed a trial of 5-azacytidine and what that would entail. He would like to consider this and discuss further with Dr. Truett Bruce when he returns for a followup visit in one month.  Plan reviewed with Dr. Truett Bruce.  Lonna Cobb ANP/GNP-BC

## 2013-04-06 NOTE — Telephone Encounter (Signed)
Gave pt appt for labs and MD for October , emailed [facility] regarding 2 units of PRBC on 10/14

## 2013-04-07 ENCOUNTER — Telehealth: Payer: Self-pay | Admitting: *Deleted

## 2013-04-07 NOTE — Telephone Encounter (Signed)
Per staff message and POF I have scheduled appts.  JMW  

## 2013-04-08 ENCOUNTER — Telehealth: Payer: Self-pay | Admitting: Oncology

## 2013-04-08 NOTE — Telephone Encounter (Signed)
Gave pt's appt to [facility] nursing home for 10/14

## 2013-04-13 ENCOUNTER — Ambulatory Visit (HOSPITAL_BASED_OUTPATIENT_CLINIC_OR_DEPARTMENT_OTHER): Payer: Medicare Other

## 2013-04-13 ENCOUNTER — Other Ambulatory Visit: Payer: Self-pay | Admitting: *Deleted

## 2013-04-13 ENCOUNTER — Other Ambulatory Visit (HOSPITAL_BASED_OUTPATIENT_CLINIC_OR_DEPARTMENT_OTHER): Payer: Medicare Other | Admitting: Lab

## 2013-04-13 VITALS — BP 147/62 | HR 79 | Temp 99.2°F | Resp 16

## 2013-04-13 DIAGNOSIS — D649 Anemia, unspecified: Secondary | ICD-10-CM

## 2013-04-13 DIAGNOSIS — D46C Myelodysplastic syndrome with isolated del(5q) chromosomal abnormality: Secondary | ICD-10-CM

## 2013-04-13 LAB — CBC WITH DIFFERENTIAL/PLATELET
EOS%: 4.2 % (ref 0.0–7.0)
Eosinophils Absolute: 0.2 10*3/uL (ref 0.0–0.5)
MCHC: 33 g/dL (ref 32.0–36.0)
MCV: 89.2 fL (ref 79.3–98.0)
MONO%: 16 % — ABNORMAL HIGH (ref 0.0–14.0)
NEUT#: 2.3 10*3/uL (ref 1.5–6.5)
Platelets: 110 10*3/uL — ABNORMAL LOW (ref 140–400)
RBC: 2.5 10*6/uL — ABNORMAL LOW (ref 4.20–5.82)
RDW: 17.3 % — ABNORMAL HIGH (ref 11.0–14.6)
lymph#: 1.6 10*3/uL (ref 0.9–3.3)

## 2013-04-13 LAB — HOLD TUBE, BLOOD BANK

## 2013-04-13 MED ORDER — FUROSEMIDE 10 MG/ML IJ SOLN
20.0000 mg | Freq: Once | INTRAMUSCULAR | Status: AC
  Administered 2013-04-13: 20 mg via INTRAVENOUS

## 2013-04-13 MED ORDER — SODIUM CHLORIDE 0.9 % IV SOLN
250.0000 mL | Freq: Once | INTRAVENOUS | Status: AC
  Administered 2013-04-13: 250 mL via INTRAVENOUS

## 2013-04-13 NOTE — Patient Instructions (Signed)
Blood Transfusion  A blood transfusion replaces your blood or some of its parts. Blood is replaced when you have lost blood because of surgery, an accident, or for severe blood conditions like anemia. You can donate blood to be used on yourself if you have a planned surgery. If you lose blood during that surgery, your own blood can be given back to you. Any blood given to you is checked to make sure it matches your blood type. Your temperature, blood pressure, and heart rate (vital signs) will be checked often.  GET HELP RIGHT AWAY IF:   You feel sick to your stomach (nauseous) or throw up (vomit).  You have watery poop (diarrhea).  You have shortness of breath or trouble breathing.  You have blood in your pee (urine) or have dark colored pee.  You have chest pain or tightness.  Your eyes or skin turn yellow (jaundice).  You have a temperature by mouth above 102 F (38.9 C), not controlled by medicine.  You start to shake and have chills.  You develop a a red rash (hives) or feel itchy.  You develop lightheadedness or feel confused.  You develop back, joint, or muscle pain.  You do not feel hungry (lost appetite).  You feel tired, restless, or nervous.  You develop belly (abdominal) cramps. Document Released: 09/13/2008 Document Revised: 09/09/2011 Document Reviewed: 09/13/2008 ExitCare Patient Information 2014 [employer].  

## 2013-04-14 LAB — TYPE AND SCREEN
ABO/RH(D): O POS
Antibody Screen: NEGATIVE
Unit division: 0

## 2013-04-20 ENCOUNTER — Telehealth: Payer: Self-pay | Admitting: *Deleted

## 2013-04-20 ENCOUNTER — Other Ambulatory Visit: Payer: Self-pay | Admitting: *Deleted

## 2013-04-20 ENCOUNTER — Other Ambulatory Visit (HOSPITAL_BASED_OUTPATIENT_CLINIC_OR_DEPARTMENT_OTHER): Payer: Medicare Other | Admitting: Lab

## 2013-04-20 DIAGNOSIS — D46C Myelodysplastic syndrome with isolated del(5q) chromosomal abnormality: Secondary | ICD-10-CM

## 2013-04-20 DIAGNOSIS — D649 Anemia, unspecified: Secondary | ICD-10-CM

## 2013-04-20 LAB — HOLD TUBE, BLOOD BANK

## 2013-04-20 LAB — CBC WITH DIFFERENTIAL/PLATELET
BASO%: 1.3 % (ref 0.0–2.0)
MCHC: 33.1 g/dL (ref 32.0–36.0)
MONO#: 1.1 10*3/uL — ABNORMAL HIGH (ref 0.1–0.9)
RBC: 2.86 10*6/uL — ABNORMAL LOW (ref 4.20–5.82)
WBC: 7.7 10*3/uL (ref 4.0–10.3)
lymph#: 2.1 10*3/uL (ref 0.9–3.3)

## 2013-04-20 NOTE — Telephone Encounter (Signed)
CBC reviewed with Misty Stanley, NP. Plan for transfusion next week. Spoke with pt in lobby, explained he will not need transfusion today. Will schedule appt for 10/28. He voiced understanding. Notified transportation to pick up pt.

## 2013-04-27 ENCOUNTER — Other Ambulatory Visit: Payer: Self-pay | Admitting: *Deleted

## 2013-04-27 ENCOUNTER — Other Ambulatory Visit (HOSPITAL_BASED_OUTPATIENT_CLINIC_OR_DEPARTMENT_OTHER): Payer: Medicare Other | Admitting: Lab

## 2013-04-27 ENCOUNTER — Encounter (INDEPENDENT_AMBULATORY_CARE_PROVIDER_SITE_OTHER): Payer: Self-pay

## 2013-04-27 ENCOUNTER — Ambulatory Visit (HOSPITAL_BASED_OUTPATIENT_CLINIC_OR_DEPARTMENT_OTHER): Payer: Medicare Other

## 2013-04-27 VITALS — BP 162/67 | HR 55 | Temp 98.1°F | Resp 24

## 2013-04-27 DIAGNOSIS — D649 Anemia, unspecified: Secondary | ICD-10-CM

## 2013-04-27 DIAGNOSIS — D46C Myelodysplastic syndrome with isolated del(5q) chromosomal abnormality: Secondary | ICD-10-CM

## 2013-04-27 LAB — CBC WITH DIFFERENTIAL/PLATELET
BASO%: 0.6 % (ref 0.0–2.0)
LYMPH%: 17.2 % (ref 14.0–49.0)
MCH: 29.5 pg (ref 27.2–33.4)
MCHC: 32 g/dL (ref 32.0–36.0)
MONO#: 1.5 10*3/uL — ABNORMAL HIGH (ref 0.1–0.9)
NEUT#: 6.5 10*3/uL (ref 1.5–6.5)
Platelets: 158 10*3/uL (ref 140–400)
RBC: 2.51 10*6/uL — ABNORMAL LOW (ref 4.20–5.82)
RDW: 19.7 % — ABNORMAL HIGH (ref 11.0–14.6)
WBC: 10 10*3/uL (ref 4.0–10.3)

## 2013-04-27 LAB — HOLD TUBE, BLOOD BANK

## 2013-04-27 MED ORDER — SODIUM CHLORIDE 0.9 % IV SOLN
250.0000 mL | Freq: Once | INTRAVENOUS | Status: AC
  Administered 2013-04-27: 250 mL via INTRAVENOUS

## 2013-04-27 MED ORDER — FUROSEMIDE 10 MG/ML IJ SOLN
20.0000 mg | Freq: Once | INTRAMUSCULAR | Status: AC
  Administered 2013-04-27: 20 mg via INTRAVENOUS

## 2013-04-27 NOTE — Patient Instructions (Signed)
Blood Transfusion Information WHAT IS A BLOOD TRANSFUSION? A transfusion is the replacement of blood or some of its parts. Blood is made up of multiple cells which provide different functions.  Red blood cells carry oxygen and are used for blood loss replacement.  White blood cells fight against infection.  Platelets control bleeding.  Plasma helps clot blood.  Other blood products are available for specialized needs, such as hemophilia or other clotting disorders. BEFORE THE TRANSFUSION  Who gives blood for transfusions?   You may be able to donate blood to be used at a later date on yourself (autologous donation).  Relatives can be asked to donate blood. This is generally not any safer than if you have received blood from a stranger. The same precautions are taken to ensure safety when a relative's blood is donated.  Healthy volunteers who are fully evaluated to make sure their blood is safe. This is blood bank blood. Transfusion therapy is the safest it has ever been in the practice of medicine. Before blood is taken from a donor, a complete history is taken to make sure that person has no history of diseases nor engages in risky social behavior (examples are intravenous drug use or sexual activity with multiple partners). The donor's travel history is screened to minimize risk of transmitting infections, such as malaria. The donated blood is tested for signs of infectious diseases, such as HIV and hepatitis. The blood is then tested to be sure it is compatible with you in order to minimize the chance of a transfusion reaction. If you or a relative donates blood, this is often done in anticipation of surgery and is not appropriate for emergency situations. It takes many days to process the donated blood. RISKS AND COMPLICATIONS Although transfusion therapy is very safe and saves many lives, the main dangers of transfusion include:   Getting an infectious disease.  Developing a  transfusion reaction. This is an allergic reaction to something in the blood you were given. Every precaution is taken to prevent this. The decision to have a blood transfusion has been considered carefully by your caregiver before blood is given. Blood is not given unless the benefits outweigh the risks. AFTER THE TRANSFUSION  Right after receiving a blood transfusion, you will usually feel much better and more energetic. This is especially true if your red blood cells have gotten low (anemic). The transfusion raises the level of the red blood cells which carry oxygen, and this usually causes an energy increase.  The nurse administering the transfusion will monitor you carefully for complications. HOME CARE INSTRUCTIONS  No special instructions are needed after a transfusion. You may find your energy is better. Speak with your caregiver about any limitations on activity for underlying diseases you may have. SEEK MEDICAL CARE IF:   Your condition is not improving after your transfusion.  You develop redness or irritation at the intravenous (IV) site. SEEK IMMEDIATE MEDICAL CARE IF:  Any of the following symptoms occur over the next 12 hours:  Shaking chills.  You have a temperature by mouth above 102 F (38.9 C), not controlled by medicine.  Chest, back, or muscle pain.  People around you feel you are not acting correctly or are confused.  Shortness of breath or difficulty breathing.  Dizziness and fainting.  You get a rash or develop hives.  You have a decrease in urine output.  Your urine turns a dark color or changes to pink, red, or brown. Any of the following   symptoms occur over the next 10 days:  You have a temperature by mouth above 102 F (38.9 C), not controlled by medicine.  Shortness of breath.  Weakness after normal activity.  The white part of the eye turns yellow (jaundice).  You have a decrease in the amount of urine or are urinating less often.  Your  urine turns a dark color or changes to pink, red, or brown. Document Released: 06/14/2000 Document Revised: 09/09/2011 Document Reviewed: 02/01/2008 Exit[employer] Patient Information 2014 [employer].  

## 2013-04-28 LAB — TYPE AND SCREEN
ABO/RH(D): O POS
Antibody Screen: NEGATIVE
Unit division: 0

## 2013-05-03 ENCOUNTER — Non-Acute Institutional Stay (SKILLED_NURSING_FACILITY): Payer: Medicare Other | Admitting: Nurse Practitioner

## 2013-05-03 DIAGNOSIS — D649 Anemia, unspecified: Secondary | ICD-10-CM

## 2013-05-03 DIAGNOSIS — I509 Heart failure, unspecified: Secondary | ICD-10-CM

## 2013-05-03 DIAGNOSIS — N182 Chronic kidney disease, stage 2 (mild): Secondary | ICD-10-CM

## 2013-05-03 DIAGNOSIS — I10 Essential (primary) hypertension: Secondary | ICD-10-CM

## 2013-05-03 DIAGNOSIS — D46C Myelodysplastic syndrome with isolated del(5q) chromosomal abnormality: Secondary | ICD-10-CM

## 2013-05-03 DIAGNOSIS — E785 Hyperlipidemia, unspecified: Secondary | ICD-10-CM

## 2013-05-03 DIAGNOSIS — E1329 Other specified diabetes mellitus with other diabetic kidney complication: Secondary | ICD-10-CM

## 2013-05-03 NOTE — Progress Notes (Signed)
Patient ID: Aaron Bruce, male   DOB: [birth year], 79 y.o.   MRN: [id number] Nursing Home Location:  [facility]   Place of Service: SNF (31)  PCP: Michiel Sites, MD   No Known Allergies  Chief Complaint  Patient presents with  . Medical Managment of Chronic Issues    HPI:  79 yo male with h/o myelodysplastic syndrome with 5q deletion and anemia requiring transfusions, DMII, L5 vertebral fx, hypertension, hyperlipidemia, PVD, sick sinus syndrome and acute on chronic diastolic chf is being seen today for routine follow up. He follows with Dr. Myrle Sheng for procrit injections for his severe macrocytic anemia. He was last transfused was last week.  Nursing has no concerns at this time.    Review of Systems:  Review of Systems  Constitutional: Negative for fever, chills and malaise/fatigue.  Respiratory: Negative for cough and shortness of breath.   Cardiovascular: Negative for chest pain.  Gastrointestinal: Negative for heartburn, abdominal pain and constipation.  Genitourinary: Negative for dysuria, urgency and frequency.  Musculoskeletal: Positive for myalgias (occasionally). Negative for joint pain.  Skin: Negative.   Neurological: Negative for weakness and headaches.     Past Medical History  Diagnosis Date  . Anemia   . Hypertension   . Diabetes mellitus   . Hyperlipemia   . Peripheral vascular disease   . Arthritis     Osteoarthritis  . Irritable bowel syndrome (IBS)   . Myelodysplasia with 5q deletion   . Thrombocytopenia     stable--due to Revlimid  . Chronic CHF 01/28/2013  . Stroke   . Depression   . Hypercalcemia    Past Surgical History  Procedure Laterality Date  . Tonsillectomy  1948  . Kidney stone surgery  1950's   Social History:   reports that he quit smoking about 74 years ago. He has never used smokeless tobacco. He reports that he does not drink alcohol or use illicit drugs.  No family history on  file.  Medications: Patient's Medications  New Prescriptions   No medications on file  Previous Medications   AMLODIPINE (NORVASC) 10 MG TABLET    Take 10 mg by mouth daily.    ASPIRIN 325 MG TABLET    Take 1 tablet (325 mg total) by mouth daily.   CARVEDILOL (COREG) 3.125 MG TABLET    Take 1 tablet (3.125 mg total) by mouth 2 (two) times daily.   EZETIMIBE-SIMVASTATIN (VYTORIN) 10-40 MG PER TABLET    Take 1 tablet by mouth at bedtime.   FERROUS SULFATE 325 (65 FE) MG TABLET    Take 325 mg by mouth 2 (two) times daily.   FLUOXETINE (PROZAC) 10 MG CAPSULE    Take 10 mg by mouth daily.   FUROSEMIDE (LASIX) 40 MG TABLET    Take 1 tablet (40 mg total) by mouth 2 (two) times daily.   HYDROCODONE-ACETAMINOPHEN (NORCO/VICODIN) 5-325 MG PER TABLET    Take 1 tablet by mouth every 4 (four) hours as needed.   INSULIN ASPART (NOVOLOG) 100 UNIT/ML INJECTION    Inject 5 Units into the skin 3 (three) times daily with meals. For CBG greater than or equal to 150   INSULIN DETEMIR (LEVEMIR) 100 UNIT/ML INJECTION    Inject 18 Units into the skin at bedtime.   ISOSORBIDE MONONITRATE (IMDUR) 30 MG 24 HR TABLET    Take 1 tablet (30 mg total) by mouth daily.   MULTIPLE VITAMIN (MULTIVITAMIN WITH MINERALS) TABS TABLET    Take 1  tablet by mouth daily.   MYRBETRIQ 50 MG TB24    Take 50 mg by mouth Daily.   TERIPARATIDE, RECOMBINANT, (FORTEO [place])    Inject 20 mcg into the skin daily.    VITAMIN B-12 1000 MCG TABLET    Take 1 tablet (1,000 mcg total) by mouth daily.  Modified Medications   No medications on file  Discontinued Medications   No medications on file     Physical Exam:  Filed Vitals:   05/03/13 1127  BP: 108/58  Pulse: 56  Temp: 97.5 F (36.4 C)  Resp: 20   GENERAL APPEARANCE: Alert, conversant. Appropriately groomed. No acute distress.  SKIN: No diaphoresis rash, or wounds HEENT: unremakrable RESPIRATORY: Breathing is even, unlabored. Lung sounds are clear   CARDIOVASCULAR: Heart RRR no  murmurs, rubs or gallops. +1 LE edema.  ARTERIAL: radial pulse 2+ VENOUS: No varicosities. No venous stasis skin changes  GASTROINTESTINAL: Abdomen is soft, non-tender, not distended w/ normal bowel sounds.  GENITOURINARY: Bladder non tender, not distended  MUSCULOSKELETAL: No abnormal joints or musculature NEUROLOGIC: Oriented X2. Moves all extremities no tremor. PSYCHIATRIC: Mood and affect appropriate to situation, no behavioral issues    Labs reviewed: Basic Metabolic Panel:  Recent Labs  16/10/96 0446 02/07/13 0403 02/08/13 0350 02/09/13 1512 02/16/13 1414 03/16/13 1411  NA 139 139 138 143 138 140  K 4.6 4.4 4.0 4.1 4.7 5.2*  CL 103 100 97  --   --   --   CO2 29 33* 33* 29 28 29   GLUCOSE 117* 108* 150* 213* 235* 191*  BUN 58* 62* 59* 59.7* 66.7* 95.7*  CREATININE 1.56* 1.50* 1.27 1.3 1.6* 1.6*  CALCIUM 8.9 8.9 9.2 9.2 9.1 9.2   Liver Function Tests:  Recent Labs  02/05/13 1518 02/06/13 0446 02/07/13 0403  AST 14 13 12   ALT 23 22 20   ALKPHOS 71 61 59  BILITOT 0.3 0.6 0.4  PROT 5.9* 5.7* 5.5*  ALBUMIN 3.4* 3.4* 3.2*   No results found for this basename: LIPASE, AMYLASE,  in the last 8760 hours No results found for this basename: AMMONIA,  in the last 8760 hours CBC:  Recent Labs  04/13/13 1114 04/20/13 1416 04/27/13 1210  WBC 5.0 7.7 10.0  NEUTROABS 2.3 4.1 6.5  HGB 7.4* 8.5* 7.4*  HCT 22.3* 25.7* 23.1*  MCV 89.2 89.7 92.0  PLT 110* 104* 158   Cardiac Enzymes:  Recent Labs  02/05/13 1518 02/05/13 2217 02/06/13 0446  CKTOTAL 29  --   --   CKMB 5.5*  --   --   TROPONINI 0.53* 0.48* 0.42*   BNP: No components found with this basename: POCBNP,  CBG:  Recent Labs  02/07/13 1648 02/07/13 2228 02/08/13 0743  GLUCAP 127* 147* 158*   TSH: No results found for this basename: TSH,  in the last 8760 hours A1C: Lab Results  Component Value Date   HGBA1C 5.9* 08/18/2012  CMP with Estimated GFR    Result: 11/02/2012 11:38 AM   ( Status: F )      C Sodium 137     135-145 mEq/L SLN   Potassium 4.5     3.5-5.3 mEq/L SLN   Chloride 105     96-112 mEq/L SLN   CO2 25     19-32 mEq/L SLN   Glucose 129   H 70-99 mg/dL SLN   BUN 31   H 0-45 mg/dL SLN   Creatinine 4.09     0.50-1.35 mg/dL SLN  Bilirubin, Total 0.9     0.3-1.2 mg/dL SLN   Alkaline Phosphatase 69     39-117 U/L SLN   AST/SGOT 11     0-37 U/L SLN   ALT/SGPT 11     0-53 U/L SLN   Total Protein 5.3   L 6.0-8.3 g/dL SLN   Albumin 3.5     0.9-6.0 g/dL SLN   Calcium 8.7     4.5-40.9 mg/dL SLN   Est GFR, African American 68      mL/min SLN   Est GFR, NonAfrican American 59   L  mL/min SLN C Hemoglobin A1C    Result: 11/02/2012 11:33 AM   ( Status: F )       Hemoglobin A1C 7.5   H <5.7 % SLN C Estimated Average Glucose 169   H <117 mg/dL SLN BMP with Estimated GFR    Result: 03/22/2013 10:52 AM   ( Status: F )     C Sodium 139     135-145 mEq/L SLN   Potassium 5.0     3.5-5.3 mEq/L SLN   Chloride 106     96-112 mEq/L SLN   CO2 28     19-32 mEq/L SLN   Glucose 88     70-99 mg/dL SLN   BUN 811   H 9-14 mg/dL SLN   Creatinine 7.82   H 0.50-1.35 mg/dL SLN   Calcium 9.0     9.5-62.1 mg/dL SLN   Est GFR, African American 42   L  mL/min SLN   Est GFR, NonAfrican American 36   L  mL/min SLN C Basic Metabolic Panel    Result: 03/29/2013 11:58 AM   ( Status: F )     C Sodium 140     135-145 mEq/L SLN   Potassium 5.1     3.5-5.3 mEq/L SLN   Chloride 107     96-112 mEq/L SLN   CO2 25     19-32 mEq/L SLN   Glucose 107   H 70-99 mg/dL SLN   BUN 87   H 3-08 mg/dL SLN   Creatinine 6.57   H 0.50-1.35 mg/dL SLN   Calcium 9.1     8.4-69.6 mg/dL SLN   Lipid Profile    Result: 03/29/2013 11:58 AM   ( Status: F )       Cholesterol 77     0-200 mg/dL SLN C Triglyceride 44     <150 mg/dL SLN   HDL Cholesterol 40     >39 mg/dL SLN   Total Chol/HDL Ratio 1.9      Ratio SLN   VLDL Cholesterol (Calc) 9     0-40 mg/dL SLN   LDL Cholesterol (Calc) 28      0-99 mg/dL SLN   Assessment/Plan 1. Chronic CHF -daily weights -weight has remained stable No signs of worsening CHF; has parameters for additional lasix; weight has remained stable  2. HTN (hypertension), benign Patient is stable; continue current regimen. Will monitor and make changes as necessary.  3. DM2 (diabetes mellitus, type 2) Fasting blood sugars remain elevated 150 and over; will get A1c and increase levemir to 22 units at this time.   4. Anemia requiring transfusions conts with forteo and iron; frequent cbcs at cancer center; S/p 2 units PRBC last week  5. MDS (myelodysplastic syndrome) with 5q deletion Following with oncology  6. Hyperlipidemia Patient is stable; continue current regimen. Will monitor and make changes as necessary.  7. CKD  stage 2  trending up; will follow up bmp

## 2013-05-04 ENCOUNTER — Other Ambulatory Visit: Payer: Self-pay | Admitting: *Deleted

## 2013-05-04 ENCOUNTER — Ambulatory Visit (HOSPITAL_BASED_OUTPATIENT_CLINIC_OR_DEPARTMENT_OTHER): Payer: Medicare Other | Admitting: Oncology

## 2013-05-04 ENCOUNTER — Telehealth: Payer: Self-pay | Admitting: Oncology

## 2013-05-04 ENCOUNTER — Other Ambulatory Visit (HOSPITAL_BASED_OUTPATIENT_CLINIC_OR_DEPARTMENT_OTHER): Payer: Medicare Other | Admitting: Lab

## 2013-05-04 VITALS — BP 153/62 | HR 51 | Temp 98.0°F | Resp 18 | Ht 66.0 in

## 2013-05-04 DIAGNOSIS — D649 Anemia, unspecified: Secondary | ICD-10-CM

## 2013-05-04 DIAGNOSIS — D46C Myelodysplastic syndrome with isolated del(5q) chromosomal abnormality: Secondary | ICD-10-CM

## 2013-05-04 LAB — CBC WITH DIFFERENTIAL/PLATELET
BASO%: 1.4 % (ref 0.0–2.0)
Basophils Absolute: 0.1 10*3/uL (ref 0.0–0.1)
EOS%: 2.5 % (ref 0.0–7.0)
HCT: 26.3 % — ABNORMAL LOW (ref 38.4–49.9)
HGB: 8.5 g/dL — ABNORMAL LOW (ref 13.0–17.1)
LYMPH%: 22.3 % (ref 14.0–49.0)
MCH: 29.9 pg (ref 27.2–33.4)
MCHC: 32.2 g/dL (ref 32.0–36.0)
MCV: 93 fL (ref 79.3–98.0)
MONO%: 15.2 % — ABNORMAL HIGH (ref 0.0–14.0)
NEUT%: 58.6 % (ref 39.0–75.0)
Platelets: 92 10*3/uL — ABNORMAL LOW (ref 140–400)
RDW: 19 % — ABNORMAL HIGH (ref 11.0–14.6)
lymph#: 1.7 10*3/uL (ref 0.9–3.3)

## 2013-05-04 NOTE — Progress Notes (Signed)
   [facility]    OFFICE PROGRESS NOTE   INTERVAL HISTORY:   [name] returns as scheduled. He continues to receive frequent red cell transfusion support, last one week ago. He says that he sometimes feels better after the transfusions. He currently has a cough and dyspnea. He reports malaise. He continues to reside at the [facility] facility.  Objective:  Vital signs in last 24 hours:  Blood pressure 153/62, pulse 51, temperature 98 F (36.7 C), temperature source Oral, resp. rate 18, height 5\' 6"  (1.676 m), weight 0 lb (0 kg), SpO2 94.00%.    HEENT: Erythema/discharge at the bilateral conjunctivae Resp: Inspiratory rhonchi at the left lower chest, decreased breath sounds at the right lower chest Cardio: Regular rate and rhythm GI: No hepatosplenomegaly Vascular: Pitting edema below the knee bilaterally   Lab Results:  Lab Results  Component Value Date   WBC 7.8 05/04/2013   HGB 8.5* 05/04/2013   HCT 26.3* 05/04/2013   MCV 93.0 05/04/2013   PLT 92* 05/04/2013   ANC 1.2    Medications: I have reviewed the patient's current medications.  Assessment/Plan: 1. 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 He began Revlimid 11/30/2009. Revlimid was placed on hold 12/13/2009 when the absolute neutrophil count returned at 0.5. The Revlimid was resumed at a dose of 10 mg every other day on 12/21/2009. The anemia improved. Revlimid was decreased to 5 mg every other day 01/2010 due to neutropenia. Revlimid was discontinued following an office visit 02/28/2012 due to progressive anemia and leukopenia. Bone marrow biopsy on 04/16/2012 confirmed changes of myelodysplasia with no increase in blasts (5q-and 9:11 translocation). Revlimid resumed on 05/27/2012. Increased to 5 mg daily on 06/23/2012. Revlimid was discontinued during a hospitalization February 2014 due to continued severe anemia. He began a trial of weekly Procrit on 09/08/2012. He  continues to require frequent red cell transfusions. The Procrit was discontinued 02/05/2013. 2. Probable cellulitis of the left lower posterior leg 12/13/2009, resolved following treatment with ciprofloxacin.  3. Left lower extremity edema and pain 12/13/2009 with a negative venous Doppler. 4. History of hypertension. 5. Diabetes. 6. History of a cerebrovascular accident. 7. Hyperlipidemia. 8. Peripheral vascular disease. 9. History of neutropenia secondary to Revlimid. 10. History of mild thrombocytopenia. Likely secondary to myelodysplasia. 11. Status post kyphoplasty L3 and L4 on 07/31/2011 for compression fractures. 12. Mildly elevated creatinine 06/08/2012. 13. Hospitalization 06/16/2013 through 08/24/2012 with dyspnea found to be in acute congestive heart failure related to severe anemia. 14. Dyspnea and clinical evidence of congestive heart failure requiring hospital admission 02/05/2013, improved following diuresis and red cell transfusions 15. Cough/dyspnea-likely related to congestive heart failure in the setting of severe anemia. We will check a chest x-ray to look for evidence of pneumonia  Disposition:  [name] will be scheduled for a red cell transfusion and Lasix on 05/05/2013. We will check a chest x-ray the same day. He continues to require frequent red cell transfusions. We again discussed 5 -azacytidine. [name] acknowledge it will be difficult for him to return to the cancer Center on a daily schedule to receive this agent. He has multiple comorbid conditions. We decided to continue transfusion support for symptoms. We also need to consider hospice care. [name] will return for an office visit in 2 weeks.   Thornton Papas, MD  05/04/2013  5:52 PM

## 2013-05-04 NOTE — Telephone Encounter (Signed)
Gave pt appt for lab and Ml on November 2014, instructed pt regarding chest x-ray and PRBC tomorrow 11/5

## 2013-05-04 NOTE — Progress Notes (Signed)
Chaplain greeted patient in lobby. He stated that he was waiting for the results of his lab to see if he needed to receive a blood transfusion today. Pt said he had not been feeling well and had a cold. Chaplain expressed encouragement and empathy. Pt expressed thanks for visit.

## 2013-05-05 ENCOUNTER — Ambulatory Visit (HOSPITAL_BASED_OUTPATIENT_CLINIC_OR_DEPARTMENT_OTHER): Payer: Medicare Other

## 2013-05-05 ENCOUNTER — Ambulatory Visit (HOSPITAL_COMMUNITY)
Admission: RE | Admit: 2013-05-05 | Discharge: 2013-05-05 | Disposition: A | Discharge status: Home / Self Care | Payer: Medicare Other | Source: Ambulatory Visit | Attending: Oncology | Admitting: Oncology

## 2013-05-05 VITALS — BP 152/51 | HR 50 | Temp 98.6°F | Resp 20

## 2013-05-05 DIAGNOSIS — D649 Anemia, unspecified: Secondary | ICD-10-CM

## 2013-05-05 DIAGNOSIS — I517 Cardiomegaly: Secondary | ICD-10-CM | POA: Insufficient documentation

## 2013-05-05 DIAGNOSIS — J811 Chronic pulmonary edema: Secondary | ICD-10-CM | POA: Insufficient documentation

## 2013-05-05 DIAGNOSIS — D46C Myelodysplastic syndrome with isolated del(5q) chromosomal abnormality: Secondary | ICD-10-CM | POA: Insufficient documentation

## 2013-05-05 DIAGNOSIS — J90 Pleural effusion, not elsewhere classified: Secondary | ICD-10-CM | POA: Insufficient documentation

## 2013-05-05 MED ORDER — SODIUM CHLORIDE 0.9 % IV SOLN
250.0000 mL | Freq: Once | INTRAVENOUS | Status: AC
  Administered 2013-05-05: 250 mL via INTRAVENOUS

## 2013-05-05 MED ORDER — SODIUM CHLORIDE 0.9 % IV SOLN: 250.0000 mL | Freq: Once | INTRAVENOUS | Status: DC

## 2013-05-05 MED ORDER — FUROSEMIDE 10 MG/ML IJ SOLN
40.0000 mg | Freq: Once | INTRAMUSCULAR | Status: AC
  Administered 2013-05-05: 40 mg via INTRAVENOUS

## 2013-05-05 NOTE — Progress Notes (Signed)
Blood infusing at 185 cc/hr and continues.  No distress noted.

## 2013-05-05 NOTE — Progress Notes (Signed)
Discharged via wheelchair 2 person assist.  In no distress but c/o decreased strength.  [facility] transportation representative wheeled him to lobby for discharge.

## 2013-05-05 NOTE — Progress Notes (Signed)
Blood has ended and he denies any signs of reaction.

## 2013-05-05 NOTE — Progress Notes (Signed)
Changed pts diaper due to incontinence.

## 2013-05-06 ENCOUNTER — Ambulatory Visit (HOSPITAL_BASED_OUTPATIENT_CLINIC_OR_DEPARTMENT_OTHER): Payer: Medicare Other

## 2013-05-06 VITALS — BP 165/61 | HR 75 | Temp 98.1°F | Resp 18

## 2013-05-06 DIAGNOSIS — D649 Anemia, unspecified: Secondary | ICD-10-CM

## 2013-05-06 MED ORDER — FUROSEMIDE 10 MG/ML IJ SOLN
40.0000 mg | Freq: Once | INTRAMUSCULAR | Status: AC
  Administered 2013-05-06: 40 mg via INTRAVENOUS

## 2013-05-06 MED ORDER — SODIUM CHLORIDE 0.9 % IV SOLN
Freq: Once | INTRAVENOUS | Status: AC
  Administered 2013-05-06: 16:00:00 via INTRAVENOUS

## 2013-05-06 NOTE — Progress Notes (Signed)
Dr. Truett Perna in infusion room at this time to assess patient.

## 2013-05-06 NOTE — Patient Instructions (Signed)
Blood Transfusion  A blood transfusion replaces your blood or some of its parts. Blood is replaced when you have lost blood because of surgery, an accident, or for severe blood conditions like anemia. You can donate blood to be used on yourself if you have a planned surgery. If you lose blood during that surgery, your own blood can be given back to you. Any blood given to you is checked to make sure it matches your blood type. Your temperature, blood pressure, and heart rate (vital signs) will be checked often.  GET HELP RIGHT AWAY IF:   You feel sick to your stomach (nauseous) or throw up (vomit).  You have watery poop (diarrhea).  You have shortness of breath or trouble breathing.  You have blood in your pee (urine) or have dark colored pee.  You have chest pain or tightness.  Your eyes or skin turn yellow (jaundice).  You have a temperature by mouth above 102 F (38.9 C), not controlled by medicine.  You start to shake and have chills.  You develop a a red rash (hives) or feel itchy.  You develop lightheadedness or feel confused.  You develop back, joint, or muscle pain.  You do not feel hungry (lost appetite).  You feel tired, restless, or nervous.  You develop belly (abdominal) cramps. Document Released: 09/13/2008 Document Revised: 09/09/2011 Document Reviewed: 09/13/2008 ExitCare Patient Information 2014 [employer].  

## 2013-05-06 NOTE — Progress Notes (Signed)
Chaplain made follow-up visit with patient in the lobby. Patient said that he was doing okay but that he still had a cold. He stated that it was a "bad cold" now. Pt said he was here for his blood transfusion as usual.

## 2013-05-07 ENCOUNTER — Non-Acute Institutional Stay (SKILLED_NURSING_FACILITY): Payer: Medicare Other | Admitting: Internal Medicine

## 2013-05-07 DIAGNOSIS — H1030 Unspecified acute conjunctivitis, unspecified eye: Secondary | ICD-10-CM

## 2013-05-07 DIAGNOSIS — H1033 Unspecified acute conjunctivitis, bilateral: Secondary | ICD-10-CM

## 2013-05-07 LAB — TYPE AND SCREEN
Antibody Screen: NEGATIVE
Unit division: 0

## 2013-05-07 NOTE — Progress Notes (Signed)
Patient ID: Aaron Bruce, male   DOB: 07-Apr-1925, 79 y.o.   MRN: [id number] Location:  [facility] Provider:  Gwenith Spitz. Renato Gails, D.O., C.M.D.  Code Status:  Full code   Chief Complaint  Patient presents with  . Acute Visit    bilateral conjunctivitis    HPI:  79 yo male with h/o myelodysplasia getting chronic transfusions, chronic diastolic heart failure, prior stroke, depression and some dementia was seen for an acute visit due to erythema, drainage, pruritus and pain in bilateral eyes.  He denies any loss of vision.    Review of Systems:  Review of Systems  Constitutional: Positive for malaise/fatigue.  Eyes: Positive for photophobia, pain, discharge and redness. Negative for blurred vision and double vision.  Respiratory: Positive for shortness of breath.   Cardiovascular: Negative for chest pain.  Gastrointestinal: Negative for constipation.  Genitourinary: Positive for urgency. Negative for dysuria.       Incontinence  Musculoskeletal: Negative for falls.  Neurological: Positive for weakness. Negative for dizziness and headaches.  Endo/Heme/Allergies: Bruises/bleeds easily.  Psychiatric/Behavioral: Positive for depression and memory loss.   Medications: Patient's Medications  New Prescriptions   No medications on file  Previous Medications   AMLODIPINE (NORVASC) 10 MG TABLET    Take 10 mg by mouth daily.    ASPIRIN 325 MG TABLET    Take 1 tablet (325 mg total) by mouth daily.   CARVEDILOL (COREG) 3.125 MG TABLET    Take 1 tablet (3.125 mg total) by mouth 2 (two) times daily.   EZETIMIBE-SIMVASTATIN (VYTORIN) 10-40 MG PER TABLET    Take 1 tablet by mouth at bedtime.   FERROUS SULFATE 325 (65 FE) MG TABLET    Take 325 mg by mouth 2 (two) times daily.   FLUOXETINE (PROZAC) 10 MG CAPSULE    Take 10 mg by mouth daily.   FUROSEMIDE (LASIX) 40 MG TABLET    Take 40 mg by mouth daily.   HYDROCODONE-ACETAMINOPHEN (NORCO/VICODIN) 5-325 MG PER TABLET    Take 1  tablet by mouth every 4 (four) hours as needed.   INSULIN ASPART (NOVOLOG) 100 UNIT/ML INJECTION    Inject 5 Units into the skin 3 (three) times daily with meals. For CBG greater than or equal to 150   INSULIN DETEMIR (LEVEMIR) 100 UNIT/ML INJECTION    Inject 22 Units into the skin at bedtime.    ISOSORBIDE MONONITRATE (IMDUR) 30 MG 24 HR TABLET    Take 1 tablet (30 mg total) by mouth daily.   MULTIPLE VITAMIN (MULTIVITAMIN WITH MINERALS) TABS TABLET    Take 1 tablet by mouth daily.   MYRBETRIQ 50 MG TB24    Take 50 mg by mouth Daily.   TERIPARATIDE, RECOMBINANT, (FORTEO [place])    Inject 20 mcg into the skin daily.    VITAMIN B-12 1000 MCG TABLET    Take 1 tablet (1,000 mcg total) by mouth daily.  Modified Medications   No medications on file  Discontinued Medications   No medications on file    Physical Exam: Physical Exam  Nursing note and vitals reviewed. Constitutional:  Overweight white male, nad sitting in wheelchair  HENT:  Head: Normocephalic and atraumatic.  Eyes:  Erythema of conjunctivae, yellow, thick drainage from both eyes  Cardiovascular: Normal rate, regular rhythm, normal heart sounds and intact distal pulses.   Pulmonary/Chest: He has rales.  Neurological: He is alert.  Skin: There is pallor.     Labs reviewed: Basic Metabolic Panel:  Recent Labs  02/06/13 0446 02/07/13 0403 02/08/13 0350 02/09/13 1512 02/16/13 1414 03/16/13 1411  NA 139 139 138 143 138 140  K 4.6 4.4 4.0 4.1 4.7 5.2*  CL 103 100 97  --   --   --   CO2 29 33* 33* 29 28 29   GLUCOSE 117* 108* 150* 213* 235* 191*  BUN 58* 62* 59* 59.7* 66.7* 95.7*  CREATININE 1.56* 1.50* 1.27 1.3 1.6* 1.6*  CALCIUM 8.9 8.9 9.2 9.2 9.1 9.2    Liver Function Tests:  Recent Labs  02/05/13 1518 02/06/13 0446 02/07/13 0403  AST 14 13 12   ALT 23 22 20   ALKPHOS 71 61 59  BILITOT 0.3 0.6 0.4  PROT 5.9* 5.7* 5.5*  ALBUMIN 3.4* 3.4* 3.2*    CBC:  Recent Labs  04/20/13 1416 04/27/13 1210  05/04/13 1520  WBC 7.7 10.0 7.8  NEUTROABS 4.1 6.5 4.6  HGB 8.5* 7.4* 8.5*  HCT 25.7* 23.1* 26.3*  MCV 89.7 92.0 93.0  PLT 104* 158 92*   Assessment/Plan 1. Acute conjunctivitis of both eyes -begin gentamicin ribbons to bilateral eyes for a week due to difficulty pt will have keeping eyes open for drops

## 2013-05-12 NOTE — Progress Notes (Signed)
Patient ID: Aaron Bruce, male   DOB: 1925/02/15, 79 y.o.   MRN: [id number]  [facility]  No Known Allergies  Chief Complaint  Patient presents with  . Medical Managment of Chronic Issues   HPI  He is being seen for the management of his chronic illnesses. There are no concerns being voiced by the nursing staff at this time. He is not voicing any complaints he does continue to experience weakness and fatigue.   Past Medical History  Diagnosis Date  . Anemia   . Hypertension   . Diabetes mellitus   . Hyperlipemia   . Peripheral vascular disease   . Arthritis     Osteoarthritis  . Irritable bowel syndrome (IBS)   . Myelodysplasia with 5q deletion   . Thrombocytopenia     stable--due to Revlimid  . Stroke   . Depression   . Hypercalcemia    Past Surgical History  Procedure Laterality Date  . Tonsillectomy  1948  . Kidney stone surgery  1950's    Filed Vitals:   12/15/12 1010  BP: 139/87  Pulse: 70  Height: 5\' 5"  (1.651 m)  Weight: 171 lb (77.565 kg)     MEDICATIONS levemir 18 units daily vicodin 5/325 mg every 4 hours as needed myrbetriq 50 mg daily novolog 5 units prior to meals for cbg >=150 mvi daily Vit b12 1000 mcg daily vytorin 10/40 mg daily Nectar thick liquids norvasc 10 mg daily Asa 325 mg daily Iron twice daily prozac 10 mg daily foreto injection daily   LABS REVIEWED: 11-02-12: glcuose 129; bun 31; creat 1.13; k+4.5; na++137; liver normal albumin 3.5; hgb a1c 7.5 12-01-12: wbc 3.6; hgb 7.3; hct 21.5; mcv 103.5; plt 150     Review of Systems  Constitutional: Positive for malaise/fatigue.  Respiratory: Negative for cough and shortness of breath.   Cardiovascular: Negative for chest pain and palpitations.  Gastrointestinal: Negative for heartburn and diarrhea.  Musculoskeletal: Negative for back pain and myalgias.  Skin: Negative.   Neurological: Negative for headaches.  Psychiatric/Behavioral: Negative for depression. The patient does  not have insomnia.      Physical Exam  Constitutional: He is oriented to person, place, and time. No distress.   Mouth/Throat: Oropharynx is clear and moist. No oropharyngeal exudate.  Eyes: EOM are normal. Pupils are equal, round, and reactive to light. No scleral icterus.  Neck: Normal range of motion. No JVD present. No thyromegaly present.  Cardiovascular: Normal rate, regular rhythm, normal heart sounds and intact distal pulses.   Pulmonary/Chest: Effort normal and breath sounds normal.  Abdominal: Soft. Bowel sounds are normal. He exhibits no distension and no mass. There is no tenderness.  Musculoskeletal: Normal range of motion.  Neurological: He is alert and oriented to person, place, and time.  Skin: Skin is warm and dry.  Psychiatric:  Flat affect    ASSESSMENT/PLAN  1. Diabetes: is stable will continue levemir 18 units daily and novolog 5 units prior to meals for cbg >=150 will monitor  2. Dyslipidemia: will continue vytorin 10/40 mg daily  3. Hypertension: is stable at present time will continue norvasc 10 mg daily and asa 325 mg daily   4. Dysphagia: no signs of aspiration present; will continue nectar thick liquids and will continue to monitor for signs of distress/aspiration  5. UI; is stable will continue myrbetriq 50 mg daily  6. Anemia: is stable will continue iron twice daily  7. MDS: is without change in status; will continue current plan  of care and will monitor his status  8. Osteoporosis: will continue foreto therapy as directed and will monitor  9. Depression: is emotionally stable will continue prozac 10 mg daily

## 2013-05-19 ENCOUNTER — Ambulatory Visit (HOSPITAL_BASED_OUTPATIENT_CLINIC_OR_DEPARTMENT_OTHER): Payer: Medicare Other | Admitting: Nurse Practitioner

## 2013-05-19 ENCOUNTER — Ambulatory Visit (HOSPITAL_BASED_OUTPATIENT_CLINIC_OR_DEPARTMENT_OTHER): Payer: Medicare Other

## 2013-05-19 ENCOUNTER — Other Ambulatory Visit (HOSPITAL_BASED_OUTPATIENT_CLINIC_OR_DEPARTMENT_OTHER): Payer: Medicare Other | Admitting: Lab

## 2013-05-19 VITALS — BP 150/42 | HR 62 | Temp 98.3°F | Resp 18

## 2013-05-19 VITALS — BP 120/54 | HR 57 | Temp 98.3°F | Resp 17 | Ht 66.0 in

## 2013-05-19 DIAGNOSIS — D649 Anemia, unspecified: Secondary | ICD-10-CM

## 2013-05-19 DIAGNOSIS — D46C Myelodysplastic syndrome with isolated del(5q) chromosomal abnormality: Secondary | ICD-10-CM

## 2013-05-19 DIAGNOSIS — D6489 Other specified anemias: Secondary | ICD-10-CM

## 2013-05-19 DIAGNOSIS — I509 Heart failure, unspecified: Secondary | ICD-10-CM

## 2013-05-19 LAB — HOLD TUBE, BLOOD BANK

## 2013-05-19 LAB — CBC WITH DIFFERENTIAL/PLATELET
BASO%: 2.2 % — ABNORMAL HIGH (ref 0.0–2.0)
Basophils Absolute: 0.1 10*3/uL (ref 0.0–0.1)
EOS%: 4.7 % (ref 0.0–7.0)
HCT: 24.6 % — ABNORMAL LOW (ref 38.4–49.9)
LYMPH%: 22.9 % (ref 14.0–49.0)
MCH: 28.6 pg (ref 27.2–33.4)
MCHC: 31.6 g/dL — ABNORMAL LOW (ref 32.0–36.0)
MCV: 90.5 fL (ref 79.3–98.0)
MONO%: 13.5 % (ref 0.0–14.0)
NEUT%: 56.7 % (ref 39.0–75.0)
lymph#: 1.4 10*3/uL (ref 0.9–3.3)

## 2013-05-19 MED ORDER — FUROSEMIDE 10 MG/ML IJ SOLN
40.0000 mg | Freq: Once | INTRAMUSCULAR | Status: AC
  Administered 2013-05-19: 40 mg via INTRAVENOUS

## 2013-05-19 MED ORDER — SODIUM CHLORIDE 0.9 % IV SOLN
250.0000 mL | Freq: Once | INTRAVENOUS | Status: AC
  Administered 2013-05-19: 250 mL via INTRAVENOUS

## 2013-05-19 NOTE — Progress Notes (Addendum)
OFFICE PROGRESS NOTE  Interval history:  [name] returns as scheduled. He was last transfused 05/05/2013/05/06/2013. He has mild dyspnea at present. No fever or cough. He feels better with supplemental oxygen. He reports spending the majority of the day in a wheelchair. He isn't sure if there has been any improvement in leg swelling.  Objective: Blood pressure 120/54, pulse 57, temperature 98.3 F (36.8 C), temperature source Oral, resp. rate 17, height 5\' 6"  (1.676 m), weight 0 lb (0 kg).  Faint rales right lung base. Regular cardiac rhythm. Abdomen soft and nontender. Pitting edema below the knees bilaterally. Alert and oriented. Moves all extremities.  Lab Results: Lab Results  Component Value Date   WBC 6.2 05/19/2013   HGB 7.8* 05/19/2013   HCT 24.6* 05/19/2013   MCV 90.5 05/19/2013   PLT 129* 05/19/2013    Chemistry:    Chemistry      Component Value Date/Time   NA 140 03/16/2013 1411   NA 138 02/08/2013 0350   K 5.2* 03/16/2013 1411   K 4.0 02/08/2013 0350   CL 97 02/08/2013 0350   CL 105 07/28/2012 1507   CO2 29 03/16/2013 1411   CO2 33* 02/08/2013 0350   BUN 95.7* 03/16/2013 1411   BUN 59* 02/08/2013 0350   CREATININE 1.6* 03/16/2013 1411   CREATININE 1.27 02/08/2013 0350      Component Value Date/Time   CALCIUM 9.2 03/16/2013 1411   CALCIUM 9.2 02/08/2013 0350   ALKPHOS 59 02/07/2013 0403   ALKPHOS 67 01/27/2013 0823   AST 12 02/07/2013 0403   AST 14 01/27/2013 0823   ALT 20 02/07/2013 0403   ALT 26 01/27/2013 0823   BILITOT 0.4 02/07/2013 0403   BILITOT 0.73 01/27/2013 0823       Studies/Results: Dg Chest 2 View  05/05/2013   CLINICAL DATA:  Pneumonia, congestive heart failure  COMPARISON:  DG CHEST 2 VIEW dated 02/05/2013  FINDINGS: Stable enlarged cardiac silhouette. There is mild pulmonary edema pattern not changed from prior. Small bilateral pleural effusions are present. No pneumothorax. Degenerative osteophytosis of the thoracic spine.  IMPRESSION: 1. Cardiomegaly  and mild edema 2. Small effusions are improved compared to prior  EXAM THE: CHEST  2 VIEW   Electronically Signed   By: Genevive Bi M.D.   On: 05/05/2013 14:30    Medications: I have reviewed the patient's current medications.  Assessment/Plan:  1. 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 He began Revlimid 11/30/2009. Revlimid was placed on hold 12/13/2009 when the absolute neutrophil count returned at 0.5. The Revlimid was resumed at a dose of 10 mg every other day on 12/21/2009. The anemia improved. Revlimid was decreased to 5 mg every other day 01/2010 due to neutropenia. Revlimid was discontinued following an office visit 02/28/2012 due to progressive anemia and leukopenia. Bone marrow biopsy on 04/16/2012 confirmed changes of myelodysplasia with no increase in blasts (5q-and 9:11 translocation). Revlimid resumed on 05/27/2012. Increased to 5 mg daily on 06/23/2012. Revlimid was discontinued during a hospitalization February 2014 due to continued severe anemia. He began a trial of weekly Procrit on 09/08/2012. He continues to require frequent red cell transfusions. The Procrit was discontinued 02/05/2013. 2. Probable cellulitis of the left lower posterior leg 12/13/2009, resolved following treatment with ciprofloxacin.  3. Left lower extremity edema and pain 12/13/2009 with a negative venous Doppler. 4. History of hypertension. 5. Diabetes. 6. History of a cerebrovascular accident. 7. Hyperlipidemia. 8. Peripheral vascular disease. 9. History of neutropenia  secondary to Revlimid. 10. History of mild thrombocytopenia. Likely secondary to myelodysplasia. 11. Status post kyphoplasty L3 and L4 on 07/31/2011 for compression fractures. 12. Mildly elevated creatinine 06/08/2012. 13. Hospitalization 06/16/2013 through 08/24/2012 with dyspnea found to be in acute congestive heart failure related to severe anemia. 14. Dyspnea and clinical evidence of congestive heart  failure requiring hospital admission 02/05/2013, improved following diuresis and red cell transfusions 15. Cough/dyspnea when here 05/04/2013. Likely related to congestive heart failure in the setting of severe anemia. Chest x-ray showed cardiomegaly and mild edema. Small bilateral pleural effusions were improved.  Disposition-Aaron Bruce will receive 2 units of blood with Lasix between units today. He will return in 2 weeks for labs and 4 weeks for an office visit. We again discussed the 5-azacytidine. He has not made a decision. We decided to continue when necessary transfusion support.  Patient seen with Dr. Truett Perna.  Lonna Cobb ANP/GNP-BC   This was a shared visit with Lonna Cobb. He continues to have severe transfusion-dependent anemia. We discussed the role for 5-Azacytidine chemotherapy. [name] would like me to call his daughter to discuss this further. The plan is to continue transfusion support for now. I will attempt to contact his daughter over the next week.  Mancel Bale, M.D.

## 2013-05-19 NOTE — Patient Instructions (Signed)
Blood Transfusion Information WHAT IS A BLOOD TRANSFUSION? A transfusion is the replacement of blood or some of its parts. Blood is made up of multiple cells which provide different functions.  Red blood cells carry oxygen and are used for blood loss replacement.  White blood cells fight against infection.  Platelets control bleeding.  Plasma helps clot blood.  Other blood products are available for specialized needs, such as hemophilia or other clotting disorders. BEFORE THE TRANSFUSION  Who gives blood for transfusions?   You may be able to donate blood to be used at a later date on yourself (autologous donation).  Relatives can be asked to donate blood. This is generally not any safer than if you have received blood from a stranger. The same precautions are taken to ensure safety when a relative's blood is donated.  Healthy volunteers who are fully evaluated to make sure their blood is safe. This is blood bank blood. Transfusion therapy is the safest it has ever been in the practice of medicine. Before blood is taken from a donor, a complete history is taken to make sure that person has no history of diseases nor engages in risky social behavior (examples are intravenous drug use or sexual activity with multiple partners). The donor's travel history is screened to minimize risk of transmitting infections, such as malaria. The donated blood is tested for signs of infectious diseases, such as HIV and hepatitis. The blood is then tested to be sure it is compatible with you in order to minimize the chance of a transfusion reaction. If you or a relative donates blood, this is often done in anticipation of surgery and is not appropriate for emergency situations. It takes many days to process the donated blood. RISKS AND COMPLICATIONS Although transfusion therapy is very safe and saves many lives, the main dangers of transfusion include:   Getting an infectious disease.  Developing a  transfusion reaction. This is an allergic reaction to something in the blood you were given. Every precaution is taken to prevent this. The decision to have a blood transfusion has been considered carefully by your caregiver before blood is given. Blood is not given unless the benefits outweigh the risks. AFTER THE TRANSFUSION  Right after receiving a blood transfusion, you will usually feel much better and more energetic. This is especially true if your red blood cells have gotten low (anemic). The transfusion raises the level of the red blood cells which carry oxygen, and this usually causes an energy increase.  The nurse administering the transfusion will monitor you carefully for complications. HOME CARE INSTRUCTIONS  No special instructions are needed after a transfusion. You may find your energy is better. Speak with your caregiver about any limitations on activity for underlying diseases you may have. SEEK MEDICAL CARE IF:   Your condition is not improving after your transfusion.  You develop redness or irritation at the intravenous (IV) site. SEEK IMMEDIATE MEDICAL CARE IF:  Any of the following symptoms occur over the next 12 hours:  Shaking chills.  You have a temperature by mouth above 102 F (38.9 C), not controlled by medicine.  Chest, back, or muscle pain.  People around you feel you are not acting correctly or are confused.  Shortness of breath or difficulty breathing.  Dizziness and fainting.  You get a rash or develop hives.  You have a decrease in urine output.  Your urine turns a dark color or changes to pink, red, or brown. Any of the following   symptoms occur over the next 10 days:  You have a temperature by mouth above 102 F (38.9 C), not controlled by medicine.  Shortness of breath.  Weakness after normal activity.  The white part of the eye turns yellow (jaundice).  You have a decrease in the amount of urine or are urinating less often.  Your  urine turns a dark color or changes to pink, red, or brown. Document Released: 06/14/2000 Document Revised: 09/09/2011 Document Reviewed: 02/01/2008 Exit[employer] Patient Information 2014 [employer].  

## 2013-05-20 ENCOUNTER — Telehealth: Payer: Self-pay | Admitting: Oncology

## 2013-05-20 LAB — TYPE AND SCREEN
ABO/RH(D): O POS
Antibody Screen: NEGATIVE
Unit division: 0
Unit division: 0

## 2013-05-20 NOTE — Telephone Encounter (Signed)
call pt home # and was advised to call [facility] @ [phone] ...s.w. nurse and advised on all DEC appts...ok and aware

## 2013-06-02 ENCOUNTER — Other Ambulatory Visit: Payer: Medicare Other | Admitting: Lab

## 2013-06-09 ENCOUNTER — Non-Acute Institutional Stay (SKILLED_NURSING_FACILITY): Payer: Medicare Other | Admitting: Internal Medicine

## 2013-06-09 ENCOUNTER — Encounter: Payer: Self-pay | Admitting: Internal Medicine

## 2013-06-09 DIAGNOSIS — D46C Myelodysplastic syndrome with isolated del(5q) chromosomal abnormality: Secondary | ICD-10-CM

## 2013-06-09 DIAGNOSIS — R410 Disorientation, unspecified: Secondary | ICD-10-CM

## 2013-06-09 DIAGNOSIS — E1149 Type 2 diabetes mellitus with other diabetic neurological complication: Secondary | ICD-10-CM

## 2013-06-09 DIAGNOSIS — I5031 Acute diastolic (congestive) heart failure: Secondary | ICD-10-CM

## 2013-06-09 DIAGNOSIS — I509 Heart failure, unspecified: Secondary | ICD-10-CM

## 2013-06-09 DIAGNOSIS — N182 Chronic kidney disease, stage 2 (mild): Secondary | ICD-10-CM

## 2013-06-09 DIAGNOSIS — D649 Anemia, unspecified: Secondary | ICD-10-CM

## 2013-06-09 NOTE — Progress Notes (Signed)
Patient ID: Aaron Bruce, male   DOB: January 23, 1925, 79 y.o.   MRN: [id number]  Location:  [facility]  Provider:  Gwenith Spitz. Renato Gails, D.O., C.M.D.  Code Status:  Full code, MOST    Chief Complaint  Patient presents with  . Acute Visit    shortness of breath, hypoxia to 60s on 2L, confusion--saying help, help    HPI:  79 yo male with h/o myelodysplastic syndrome with anemia requiring frequent transfusions, DMII with neurologic manifestations, chronic diastolic chf, and urinary incontinence was seen acutely due to acute hypoxia into the 60s, confusion, decreased level of responsiveness and dyspnea at rest.  He denied pain.  He kept repeating help, help, but as his oxygen improved, he became more alert.  See below for more details.  Review of Systems:  Review of Systems  Constitutional: Positive for malaise/fatigue. Negative for fever.  HENT: Negative for congestion.   Respiratory: Positive for cough, shortness of breath and wheezing. Negative for sputum production.   Cardiovascular: Positive for leg swelling. Negative for chest pain and palpitations.  Gastrointestinal: Negative for constipation.  Genitourinary: Positive for urgency. Negative for dysuria.  Musculoskeletal: Positive for joint pain.  Skin: Negative for rash.  Neurological: Positive for weakness. Negative for loss of consciousness and headaches.  Endo/Heme/Allergies: Bruises/bleeds easily.  Psychiatric/Behavioral: Positive for depression and memory loss.    Medications: Patient's Medications  New Prescriptions   No medications on file  Previous Medications   AMLODIPINE (NORVASC) 10 MG TABLET    Take 10 mg by mouth daily.    ASPIRIN 325 MG TABLET    Take 1 tablet (325 mg total) by mouth daily.   CARVEDILOL (COREG) 3.125 MG TABLET    Take 1 tablet (3.125 mg total) by mouth 2 (two) times daily.   EZETIMIBE-SIMVASTATIN (VYTORIN) 10-40 MG PER TABLET    Take 1 tablet by mouth at bedtime.   FERROUS SULFATE  325 (65 FE) MG TABLET    Take 325 mg by mouth 2 (two) times daily.   FLUOXETINE (PROZAC) 10 MG CAPSULE    Take 20 mg by mouth daily.    FUROSEMIDE (LASIX) 40 MG TABLET    Take 40 mg by mouth daily.   HYDROCODONE-ACETAMINOPHEN (NORCO/VICODIN) 5-325 MG PER TABLET    Take 1 tablet by mouth every 4 (four) hours as needed.   INSULIN ASPART (NOVOLOG) 100 UNIT/ML INJECTION    Inject 5 Units into the skin 3 (three) times daily with meals. For CBG greater than or equal to 150   INSULIN DETEMIR (LEVEMIR) 100 UNIT/ML INJECTION    Inject 22 Units into the skin at bedtime.    IPRATROPIUM-ALBUTEROL (DUONEB) 0.5-2.5 (3) MG/3ML SOLN    Take 3 mLs by nebulization every 4 (four) hours as needed.   ISOSORBIDE MONONITRATE (IMDUR) 30 MG 24 HR TABLET    Take 1 tablet (30 mg total) by mouth daily.   MULTIPLE VITAMIN (MULTIVITAMIN WITH MINERALS) TABS TABLET    Take 1 tablet by mouth daily.   MYRBETRIQ 50 MG TB24    Take 50 mg by mouth Daily.   TERIPARATIDE, RECOMBINANT, (FORTEO )    Inject 20 mcg into the skin daily.    VITAMIN B-12 1000 MCG TABLET    Take 1 tablet (1,000 mcg total) by mouth daily.   VITAMIN C (ASCORBIC ACID) 500 MG TABLET    Take 500 mg by mouth 2 (two) times daily.  Modified Medications   No medications on file  Discontinued  Medications   No medications on file    Physical Exam: Filed Vitals:   06/09/13 1310  BP: 144/63  Pulse: 83  Temp: 98 F (36.7 C)  Resp: 16  SpO2: 66%   Physical Exam  Constitutional:  Overweight white male, abdominal breathing, wearing O2 initially at 2L, upped to 5L with improvement  HENT:  Head: Normocephalic and atraumatic.  Cardiovascular: Regular rhythm and intact distal pulses.   bradycardic  Pulmonary/Chest: He is in respiratory distress. He has wheezes. He has rales. He exhibits no tenderness.  Abdominal: Soft. Bowel sounds are normal. He exhibits no distension. There is no tenderness.  Musculoskeletal: Normal range of motion. He exhibits no  tenderness.  Neurological:  Waxing and waning alertness, able to answer questions appropriately  Skin: Skin is warm and dry.  Easily bruised  Psychiatric:  Memory loss     Labs reviewed: Basic Metabolic Panel:  Recent Labs  47/82/95 0350  03/16/13 1411 07-Jul-2013 0135 07-Jul-2013 0943  NA 138  < > 140 134* 133*  K 4.0  < > 5.2* 7.5* 6.9*  CL 97  --   --  97 97  CO2 33*  < > 29 21 20   GLUCOSE 150*  < > 191* 182* 184*  BUN 59*  < > 95.7* 156* 158*  CREATININE 1.27  < > 1.6* 5.67* 6.00*  CALCIUM 9.2  < > 9.2 8.7 8.4  < > = values in this interval not displayed.  Liver Function Tests:  Recent Labs  02/06/13 0446 02/07/13 0403 Jul 07, 2013 0135  AST 13 12 9   ALT 22 20 19   ALKPHOS 61 59 69  BILITOT 0.6 0.4 0.3  PROT 5.7* 5.5* 6.0  ALBUMIN 3.4* 3.2* 3.4*    CBC:  Recent Labs  05/04/13 1520 05/19/13 1135 2013-07-07 0135 2013/07/07 0943  WBC 7.8 6.2 10.5 10.7*  NEUTROABS 4.6 3.5 7.3  --   HGB 8.5* 7.8* 7.2* 6.7*  HCT 26.3* 24.6* 21.7* 20.2*  MCV 93.0 90.5 95.6 95.3  PLT 92* 129* 147* 139*    Significant Diagnostic Results:  CXR pending  Assessment/Plan 1. Acute diastolic CHF (congestive heart failure) Dyspnea and hypoxia: Suspect volume overload/ acute chf due to hypoxia, rales on exam, and h/o the same. Oxygen increased to get sats over 90% (were in mid 60s), nebulizer treatment given, IV placed in right arm and 40mg  Lasix given IV push He put out 250cc yellow urine when foley placed to monitor outputs Cbc, bmp drawn and sent stat Pt became more alert and conversive CXR ordered stat Need care plan to discuss his goals of care--remain quite aggressive at this point considering his prognosis  2. MDS (myelodysplastic syndrome) with 5q deletion -was following with oncology--one of their notes indicates he was to continue to f/u, but he has not been seen since before Thanksgiving  3. Anemia requiring transfusions -last transfusion 11/22--this is typically the  context of his chf, and he has required lasix after transfusions -f/u cbc with diff  4. Chronic kidney disease, stage II (mild) -f/u bmp today stat  5. Subacute delirium -persists, hematology notes indicate he has been confused off and on during his appts for transfusions, this is also the case here at the facility--he became more alert after acute treatments of the hypoxia and dyspnea above, awiating other tests  6. Type II or unspecified type diabetes mellitus with neurological manifestations, not stated as uncontrolled(250.60) -fair control  Family/ staff Communication: discussed with several nurses helping with his care and  DNS  Goals of care:  Full code, MOST reviewed  Labs/tests ordered:  Cbc, bmp, cxr  2:53pm BMP returned:  Na 146, K 5.3 (before lasix), BUN 69, cr 1.60 Still awaiting xray.

## 2013-06-16 ENCOUNTER — Ambulatory Visit: Payer: Medicare Other | Admitting: Nurse Practitioner

## 2013-06-16 ENCOUNTER — Telehealth: Payer: Self-pay | Admitting: *Deleted

## 2013-06-16 ENCOUNTER — Other Ambulatory Visit: Payer: Medicare Other

## 2013-06-16 NOTE — Telephone Encounter (Signed)
Call from [place] at [facility]. Pt has pneumonia and doesn't feel up to coming in for an appt today. Will reschedule appt.

## 2013-06-17 ENCOUNTER — Inpatient Hospital Stay (HOSPITAL_COMMUNITY)
Admission: EM | Admit: 2013-06-17 | Discharge: 2013-07-01 | DRG: 682 | Disposition: E | Payer: Medicare Other | Attending: Internal Medicine | Admitting: Internal Medicine

## 2013-06-17 ENCOUNTER — Encounter (HOSPITAL_COMMUNITY): Payer: Self-pay | Admitting: Emergency Medicine

## 2013-06-17 ENCOUNTER — Telehealth: Payer: Self-pay | Admitting: Oncology

## 2013-06-17 ENCOUNTER — Emergency Department (HOSPITAL_COMMUNITY): Payer: Medicare Other

## 2013-06-17 DIAGNOSIS — D46C Myelodysplastic syndrome with isolated del(5q) chromosomal abnormality: Secondary | ICD-10-CM | POA: Diagnosis present

## 2013-06-17 DIAGNOSIS — Z515 Encounter for palliative care: Secondary | ICD-10-CM

## 2013-06-17 DIAGNOSIS — I509 Heart failure, unspecified: Secondary | ICD-10-CM | POA: Diagnosis present

## 2013-06-17 DIAGNOSIS — R001 Bradycardia, unspecified: Secondary | ICD-10-CM

## 2013-06-17 DIAGNOSIS — K117 Disturbances of salivary secretion: Secondary | ICD-10-CM

## 2013-06-17 DIAGNOSIS — N182 Chronic kidney disease, stage 2 (mild): Secondary | ICD-10-CM | POA: Diagnosis present

## 2013-06-17 DIAGNOSIS — E785 Hyperlipidemia, unspecified: Secondary | ICD-10-CM | POA: Diagnosis present

## 2013-06-17 DIAGNOSIS — R748 Abnormal levels of other serum enzymes: Secondary | ICD-10-CM | POA: Diagnosis present

## 2013-06-17 DIAGNOSIS — Z8673 Personal history of transient ischemic attack (TIA), and cerebral infarction without residual deficits: Secondary | ICD-10-CM

## 2013-06-17 DIAGNOSIS — R7989 Other specified abnormal findings of blood chemistry: Secondary | ICD-10-CM | POA: Diagnosis present

## 2013-06-17 DIAGNOSIS — N179 Acute kidney failure, unspecified: Principal | ICD-10-CM | POA: Diagnosis present

## 2013-06-17 DIAGNOSIS — R0600 Dyspnea, unspecified: Secondary | ICD-10-CM

## 2013-06-17 DIAGNOSIS — I498 Other specified cardiac arrhythmias: Secondary | ICD-10-CM | POA: Diagnosis present

## 2013-06-17 DIAGNOSIS — R0609 Other forms of dyspnea: Secondary | ICD-10-CM | POA: Diagnosis present

## 2013-06-17 DIAGNOSIS — Z9981 Dependence on supplemental oxygen: Secondary | ICD-10-CM

## 2013-06-17 DIAGNOSIS — F3289 Other specified depressive episodes: Secondary | ICD-10-CM | POA: Diagnosis present

## 2013-06-17 DIAGNOSIS — Z6826 Body mass index (BMI) 26.0-26.9, adult: Secondary | ICD-10-CM

## 2013-06-17 DIAGNOSIS — Z7982 Long term (current) use of aspirin: Secondary | ICD-10-CM

## 2013-06-17 DIAGNOSIS — R5381 Other malaise: Secondary | ICD-10-CM | POA: Diagnosis present

## 2013-06-17 DIAGNOSIS — N189 Chronic kidney disease, unspecified: Secondary | ICD-10-CM

## 2013-06-17 DIAGNOSIS — I129 Hypertensive chronic kidney disease with stage 1 through stage 4 chronic kidney disease, or unspecified chronic kidney disease: Secondary | ICD-10-CM | POA: Diagnosis present

## 2013-06-17 DIAGNOSIS — E119 Type 2 diabetes mellitus without complications: Secondary | ICD-10-CM | POA: Diagnosis present

## 2013-06-17 DIAGNOSIS — I5033 Acute on chronic diastolic (congestive) heart failure: Secondary | ICD-10-CM | POA: Diagnosis present

## 2013-06-17 DIAGNOSIS — I739 Peripheral vascular disease, unspecified: Secondary | ICD-10-CM | POA: Diagnosis present

## 2013-06-17 DIAGNOSIS — F329 Major depressive disorder, single episode, unspecified: Secondary | ICD-10-CM | POA: Diagnosis present

## 2013-06-17 DIAGNOSIS — K589 Irritable bowel syndrome without diarrhea: Secondary | ICD-10-CM | POA: Diagnosis present

## 2013-06-17 DIAGNOSIS — E875 Hyperkalemia: Secondary | ICD-10-CM | POA: Diagnosis present

## 2013-06-17 DIAGNOSIS — N19 Unspecified kidney failure: Secondary | ICD-10-CM

## 2013-06-17 DIAGNOSIS — Z794 Long term (current) use of insulin: Secondary | ICD-10-CM

## 2013-06-17 DIAGNOSIS — R627 Adult failure to thrive: Secondary | ICD-10-CM | POA: Diagnosis present

## 2013-06-17 DIAGNOSIS — Z87442 Personal history of urinary calculi: Secondary | ICD-10-CM

## 2013-06-17 DIAGNOSIS — F0390 Unspecified dementia without behavioral disturbance: Secondary | ICD-10-CM | POA: Diagnosis present

## 2013-06-17 DIAGNOSIS — D649 Anemia, unspecified: Secondary | ICD-10-CM | POA: Diagnosis present

## 2013-06-17 DIAGNOSIS — R0989 Other specified symptoms and signs involving the circulatory and respiratory systems: Secondary | ICD-10-CM | POA: Diagnosis present

## 2013-06-17 DIAGNOSIS — M1990 Unspecified osteoarthritis, unspecified site: Secondary | ICD-10-CM | POA: Diagnosis present

## 2013-06-17 DIAGNOSIS — Z87891 Personal history of nicotine dependence: Secondary | ICD-10-CM

## 2013-06-17 DIAGNOSIS — Z66 Do not resuscitate: Secondary | ICD-10-CM | POA: Diagnosis present

## 2013-06-17 LAB — BASIC METABOLIC PANEL
BUN: 158 mg/dL — ABNORMAL HIGH (ref 6–23)
CO2: 20 mEq/L (ref 19–32)
Chloride: 97 mEq/L (ref 96–112)
Creatinine, Ser: 6 mg/dL — ABNORMAL HIGH (ref 0.50–1.35)
GFR calc Af Amer: 9 mL/min — ABNORMAL LOW (ref >90)
Potassium: 6.9 mEq/L (ref 3.5–5.1)

## 2013-06-17 LAB — CBC WITH DIFFERENTIAL/PLATELET
Basophils Absolute: 0.1 10*3/uL (ref 0.0–0.1)
Basophils Relative: 1 % (ref 0–1)
Eosinophils Absolute: 0.1 10*3/uL (ref 0.0–0.7)
Eosinophils Relative: 1 % (ref 0–5)
Hemoglobin: 7.2 g/dL — ABNORMAL LOW (ref 13.0–17.0)
Lymphocytes Relative: 18 % (ref 12–46)
MCHC: 33.2 g/dL (ref 30.0–36.0)
Monocytes Absolute: 1.1 10*3/uL — ABNORMAL HIGH (ref 0.1–1.0)
Neutrophils Relative %: 70 % (ref 43–77)
Platelets: 147 10*3/uL — ABNORMAL LOW (ref 150–400)
RBC: 2.27 MIL/uL — ABNORMAL LOW (ref 4.22–5.81)
WBC Morphology: INCREASED

## 2013-06-17 LAB — GLUCOSE, CAPILLARY
Glucose-Capillary: 184 mg/dL — ABNORMAL HIGH (ref 70–99)
Glucose-Capillary: 172 mg/dL — ABNORMAL HIGH (ref 70–99)
Glucose-Capillary: 202 mg/dL — ABNORMAL HIGH (ref 70–99)
Glucose-Capillary: 140 mg/dL — ABNORMAL HIGH (ref 70–99)

## 2013-06-17 LAB — MRSA PCR SCREENING: MRSA by PCR: NEGATIVE

## 2013-06-17 LAB — CBC
HCT: 20.2 % — ABNORMAL LOW (ref 39.0–52.0)
Hemoglobin: 6.7 g/dL — CL (ref 13.0–17.0)
MCHC: 33.2 g/dL (ref 30.0–36.0)
RBC: 2.12 MIL/uL — ABNORMAL LOW (ref 4.22–5.81)

## 2013-06-17 LAB — URINE MICROSCOPIC-ADD ON

## 2013-06-17 LAB — POCT I-STAT TROPONIN I: Troponin i, poc: 0.18 ng/mL (ref 0.00–0.08)

## 2013-06-17 LAB — SODIUM, URINE, RANDOM: Sodium, Ur: 12 mEq/L

## 2013-06-17 LAB — COMPREHENSIVE METABOLIC PANEL
ALT: 19 U/L (ref 0–53)
AST: 9 U/L (ref 0–37)
Albumin: 3.4 g/dL — ABNORMAL LOW (ref 3.5–5.2)
BUN: 156 mg/dL — ABNORMAL HIGH (ref 6–23)
Chloride: 97 mEq/L (ref 96–112)
Creatinine, Ser: 5.67 mg/dL — ABNORMAL HIGH (ref 0.50–1.35)
Potassium: 7.5 mEq/L (ref 3.5–5.1)
Sodium: 134 mEq/L — ABNORMAL LOW (ref 135–145)
Total Bilirubin: 0.3 mg/dL (ref 0.3–1.2)
Total Protein: 6 g/dL (ref 6.0–8.3)

## 2013-06-17 LAB — URINALYSIS, ROUTINE W REFLEX MICROSCOPIC
Hgb urine dipstick: NEGATIVE
Ketones, ur: NEGATIVE mg/dL
Nitrite: NEGATIVE
Protein, ur: 100 mg/dL — AB
Specific Gravity, Urine: 1.017 (ref 1.005–1.030)
Urobilinogen, UA: 0.2 mg/dL (ref 0.0–1.0)

## 2013-06-17 LAB — PRO B NATRIURETIC PEPTIDE: Pro B Natriuretic peptide (BNP): 24104 pg/mL — ABNORMAL HIGH (ref 0–450)

## 2013-06-17 LAB — TYPE AND SCREEN
ABO/RH(D): O POS
Antibody Screen: NEGATIVE

## 2013-06-17 LAB — OCCULT BLOOD, POC DEVICE: Fecal Occult Bld: NEGATIVE

## 2013-06-17 LAB — CREATININE, URINE, RANDOM: Creatinine, Urine: 193.4 mg/dL

## 2013-06-17 MED ORDER — DEXTROSE 250 MG/ML IV SOLN: 1.0000 g/kg | Freq: Once | INTRAVENOUS | Status: DC

## 2013-06-17 MED ORDER — FUROSEMIDE 10 MG/ML IJ SOLN
160.0000 mg | Freq: Three times a day (TID) | INTRAVENOUS | Status: DC
  Administered 2013-06-17 (×3): 160 mg via INTRAVENOUS
  Filled 2013-06-17 (×8): qty 16

## 2013-06-17 MED ORDER — ALBUTEROL SULFATE (5 MG/ML) 0.5% IN NEBU: 2.5000 mg | INHALATION_SOLUTION | RESPIRATORY_TRACT | Status: DC | PRN

## 2013-06-17 MED ORDER — SODIUM CHLORIDE 0.9 % IV SOLN
1.0000 g | Freq: Once | INTRAVENOUS | Status: AC
  Administered 2013-06-17: 1 g via INTRAVENOUS
  Filled 2013-06-17: qty 10

## 2013-06-17 MED ORDER — SODIUM POLYSTYRENE SULFONATE 15 GM/60ML PO SUSP
30.0000 g | ORAL | Status: AC
  Administered 2013-06-17: 30 g via ORAL
  Filled 2013-06-17: qty 120

## 2013-06-17 MED ORDER — ONDANSETRON HCL 4 MG PO TABS: 4.0000 mg | ORAL_TABLET | Freq: Four times a day (QID) | ORAL | Status: DC | PRN

## 2013-06-17 MED ORDER — INSULIN ASPART 100 UNIT/ML ~~LOC~~ SOLN
0.0000 [IU] | Freq: Three times a day (TID) | SUBCUTANEOUS | Status: DC
  Administered 2013-06-17: 2 [IU] via SUBCUTANEOUS

## 2013-06-17 MED ORDER — ONDANSETRON HCL 4 MG/2ML IJ SOLN: 4.0000 mg | Freq: Four times a day (QID) | INTRAMUSCULAR | Status: DC | PRN

## 2013-06-17 MED ORDER — IPRATROPIUM BROMIDE 0.02 % IN SOLN: 0.5000 mg | RESPIRATORY_TRACT | Status: DC | PRN

## 2013-06-17 MED ORDER — ISOSORBIDE MONONITRATE ER 30 MG PO TB24
30.0000 mg | ORAL_TABLET | Freq: Every day | ORAL | Status: DC
  Administered 2013-06-17: 30 mg via ORAL
  Filled 2013-06-17: qty 1

## 2013-06-17 MED ORDER — EZETIMIBE 10 MG PO TABS
10.0000 mg | ORAL_TABLET | Freq: Every day | ORAL | Status: DC
  Filled 2013-06-17: qty 1

## 2013-06-17 MED ORDER — SODIUM BICARBONATE 8.4 % IV SOLN
50.0000 meq | Freq: Once | INTRAVENOUS | Status: AC
  Administered 2013-06-17: 50 meq via INTRAVENOUS
  Filled 2013-06-17: qty 50

## 2013-06-17 MED ORDER — IPRATROPIUM-ALBUTEROL 0.5-2.5 (3) MG/3ML IN SOLN: 3.0000 mL | RESPIRATORY_TRACT | Status: DC | PRN

## 2013-06-17 MED ORDER — EZETIMIBE-SIMVASTATIN 10-40 MG PO TABS: 1.0000 | ORAL_TABLET | Freq: Every day | ORAL | Status: DC

## 2013-06-17 MED ORDER — SODIUM CHLORIDE 0.9 % IJ SOLN
3.0000 mL | Freq: Two times a day (BID) | INTRAMUSCULAR | Status: DC
  Administered 2013-06-17: 3 mL via INTRAVENOUS

## 2013-06-17 MED ORDER — ACETAMINOPHEN 325 MG PO TABS: 650.0000 mg | ORAL_TABLET | Freq: Four times a day (QID) | ORAL | Status: DC | PRN

## 2013-06-17 MED ORDER — SODIUM POLYSTYRENE SULFONATE 15 GM/60ML PO SUSP
30.0000 g | Freq: Once | ORAL | Status: AC
  Administered 2013-06-17: 30 g via ORAL
  Filled 2013-06-17: qty 120

## 2013-06-17 MED ORDER — ACETAMINOPHEN 650 MG RE SUPP: 650.0000 mg | Freq: Four times a day (QID) | RECTAL | Status: DC | PRN

## 2013-06-17 MED ORDER — MORPHINE SULFATE 2 MG/ML IJ SOLN: 1.0000 mg | INTRAMUSCULAR | Status: DC | PRN

## 2013-06-17 MED ORDER — DEXTROSE 50 % IV SOLN: 50.0000 mL | Freq: Once | INTRAVENOUS | Status: DC

## 2013-06-17 MED ORDER — FERROUS SULFATE 325 (65 FE) MG PO TABS
325.0000 mg | ORAL_TABLET | Freq: Two times a day (BID) | ORAL | Status: DC
  Administered 2013-06-17: 325 mg via ORAL
  Filled 2013-06-17 (×2): qty 1

## 2013-06-17 MED ORDER — DEXTROSE 50 % IV SOLN
25.0000 mL | Freq: Once | INTRAVENOUS | Status: AC
  Administered 2013-06-17: 25 mL via INTRAVENOUS
  Filled 2013-06-17: qty 50

## 2013-06-17 MED ORDER — BISACODYL 10 MG RE SUPP: 10.0000 mg | Freq: Every day | RECTAL | Status: DC | PRN

## 2013-06-17 MED ORDER — ALBUTEROL SULFATE (5 MG/ML) 0.5% IN NEBU
10.0000 mg | INHALATION_SOLUTION | Freq: Once | RESPIRATORY_TRACT | Status: AC
  Administered 2013-06-17: 10 mg via RESPIRATORY_TRACT
  Filled 2013-06-17: qty 2

## 2013-06-17 MED ORDER — ATORVASTATIN CALCIUM 20 MG PO TABS
20.0000 mg | ORAL_TABLET | Freq: Every day | ORAL | Status: DC
  Filled 2013-06-17: qty 1

## 2013-06-17 MED ORDER — AMLODIPINE BESYLATE 10 MG PO TABS
10.0000 mg | ORAL_TABLET | Freq: Every day | ORAL | Status: DC
  Administered 2013-06-17: 10 mg via ORAL
  Filled 2013-06-17: qty 1

## 2013-06-17 MED ORDER — INSULIN ASPART 100 UNIT/ML IV SOLN
10.0000 [IU] | Freq: Once | INTRAVENOUS | Status: AC
  Administered 2013-06-17: 10 [IU] via INTRAVENOUS
  Filled 2013-06-17: qty 0.1

## 2013-06-17 MED ORDER — INSULIN ASPART 100 UNIT/ML IV SOLN
10.0000 [IU] | Freq: Once | INTRAVENOUS | Status: AC
  Administered 2013-06-17: 10 [IU] via INTRAVENOUS

## 2013-06-17 MED ORDER — VITAMIN C 500 MG PO TABS
500.0000 mg | ORAL_TABLET | Freq: Two times a day (BID) | ORAL | Status: DC
  Administered 2013-06-17: 500 mg via ORAL
  Filled 2013-06-17 (×2): qty 1

## 2013-06-17 MED ORDER — LORAZEPAM 2 MG/ML IJ SOLN
1.0000 mg | INTRAMUSCULAR | Status: DC | PRN
  Administered 2013-06-18: 1 mg via INTRAVENOUS
  Filled 2013-06-17: qty 1

## 2013-06-17 MED ORDER — DEXTROSE 50 % IV SOLN
1.0000 | Freq: Once | INTRAVENOUS | Status: AC
  Administered 2013-06-17: 50 mL via INTRAVENOUS
  Filled 2013-06-17: qty 50

## 2013-06-17 NOTE — Progress Notes (Signed)
CSW is available to assist with residential hospice placement, for this pt from [facility], if needed. Will continue to review PN.  Cori Razor LCSW [phone]

## 2013-06-17 NOTE — Telephone Encounter (Signed)
Called pt and left message regarding lab and ML r/s from 12/15, pt FTKA

## 2013-06-17 NOTE — Progress Notes (Signed)
Pt seen and examined, admitted this am by Dr.Kakrakandy with ARF, hyperkalemia, elevated troponin, anemia, MDS from SNF -obtunded this am but arousible, vitals stable -refused kayexalate -seen by Lorinda Creed, NP with Palliative care and goal is comfort care at this point.  Zannie Cove, MD [phone]

## 2013-06-17 NOTE — Progress Notes (Signed)
MD Glean Hess notified of critical hemoglobin level Stanford Breed RN 06/06/2013 10:23am

## 2013-06-17 NOTE — Consult Note (Signed)
Patient ZO:XWRUE Lief Palmatier      DOB: 08/21/1924      AVW:[id number]     Consult Note from the Palliative Medicine Team at [facility]    Consult Requested by: Dr Jomarie Longs     PCP: Michiel Sites, MD Reason for Consultation: Clarificcation of GOC and options     Phone Number:[phone]  Assessment of patients Current state:  Nathin Saran is a 79 y.o. male with known history of myelodysplastic syndrome, CHF, chronic kidney disease stage II, hypertension, diabetes mellitus was brought from the nursing home for low hemoglobin.  Patient does have significant anemia and has known history of myelodysplastic syndrome.  Patient has had continued decline over the past year and overall failure to thrive.  Family wishes to focus on comfort      Consult is for review of medical treatment options, clarification of goals of care and end of life issues, disposition and options, and symptom recommendation.  This NP Lorinda Creed reviewed medical records, received report from team, assessed the patient and then spoke by telephone at patient's bedside with his son Mr Patton Salles # [phone]  to discuss diagnosis prognosis, GOC, EOL wishes disposition and options.   A detailed discussion was had today regarding advanced directives.  Concepts specific to code status, artifical feeding and hydration, continued IV antibiotics and rehospitalization was had.  The difference between a aggressive medical intervention path  and a palliative comfort care path for this patient at this time was had.  Values and goals of care important to patient and family were attempted to be elicited.  Concept of Hospice and Palliative Care were discussed  Natural trajectory and expectations at EOL were discussed.  Questions and concerns addressed.  Hard Choices booklet left for review. Family encouraged to call with questions or concerns.  PMT will continue to support holistically.    Goals of Care: 1.  Code  Status:DNR/DNI-comfort is main focus of care   2. Scope of Treatment: 1. Vital Signs: daily  2. Respiratory/Oxygen:as needed for comfort 3. Nutritional Support/Tube Feeds: no artifical feeding now or in the future 4. Antibiotics:none 5. Blood Products:none 6. IVF:KVO for meds only 7. Review of Medications to be discontinued:minimize for comfort 8. Labs: none 9. Telemetry:none 10. Consults:none   4. Disposition:  Dependant on outcomes, if patient stabilizes for disposition, family is open to residential hospice.  Will meet son today at 4pm for further discussion (I meet at bedside with both son and daughter Steward Drone, all on board for comfort)  3. Symptom Management:   1. Anxiety/Agitation:Ativan 1 mg every 4 hrs prn IV 2. Pain/Dyspnea: Morphine 1 mg every 2 hrs prn IV  3. Bowel Regimen:Dulcolax supp  4. Psychosocial:  Emotional support to son, PMT will continue to support holsiticlaly  5. Spiritual:     Brief BMW:UXLKG C Zamier Eggebrecht is a 79 y.o. male with known history of myelodysplastic syndrome, CHF, chronic kidney disease stage II, hypertension, diabetes mellitus was brought from the nursing home for low hemoglobin.  Patient does have significant anemia and has known history of myelodysplastic syndrome.  Patient has had continued decline over the past year and overall failure to thrive.  Family wishes to focus on comfort     MWN:UUVOZD to illicit due to altered cognistion    PMH:  Past Medical History  Diagnosis Date  . Anemia   . Hypertension   . Diabetes mellitus   . Hyperlipemia   . Peripheral vascular disease   .  Arthritis     Osteoarthritis  . Irritable bowel syndrome (IBS)   . Myelodysplasia with 5q deletion   . Thrombocytopenia     stable--due to Revlimid  . Chronic CHF 01/28/2013  . Stroke   . Depression   . Hypercalcemia      PSH: Past Surgical History  Procedure Laterality Date  . Tonsillectomy  1948  . Kidney stone surgery  1950's   I have  reviewed the FH and SH and  If appropriate update it with new information. No Known Allergies Scheduled Meds: . amLODipine  10 mg Oral Daily  . ezetimibe  10 mg Oral QHS   And  . atorvastatin  20 mg Oral QHS  . ferrous sulfate  325 mg Oral BID  . furosemide  160 mg Intravenous TID  . insulin aspart  0-9 Units Subcutaneous TID WC  . isosorbide mononitrate  30 mg Oral Daily  . sodium chloride  3 mL Intravenous Q12H  . sodium polystyrene  30 g Oral Q4H  . vitamin C  500 mg Oral BID   Continuous Infusions:  PRN Meds:.acetaminophen, acetaminophen, albuterol, ipratropium, ondansetron (ZOFRAN) IV, ondansetron    BP 141/36  Pulse 40  Temp(Src) 97.4 F (36.3 C) (Axillary)  Resp 20  SpO2 96%   PPS: 20%  No intake or output data in the 24 hours ending 06/04/2013 1130  Physical Exam:  General: ill appearing, lethargic , pale, unable to follow commands HEENT:  Dry buccal membranes,  Chest:   Decreased in bases JXB:JYNWGNFAOZH  Rate 56 Abdomen:soft, tender to palpation, + guarding Ext: BLE +1 edema Neuro:letahrgic  Labs: CBC    Component Value Date/Time   WBC 10.7* 06/21/2013 0943   WBC 6.2 05/19/2013 1135   RBC 2.12* 06/23/2013 0943   RBC 2.71* 05/19/2013 1135   HGB 6.7* 06/09/2013 0943   HGB 7.8* 05/19/2013 1135   HCT 20.2* 06/03/2013 0943   HCT 24.6* 05/19/2013 1135   PLT 139* 06/23/2013 0943   PLT 129* 05/19/2013 1135   MCV 95.3 06/30/2013 0943   MCV 90.5 05/19/2013 1135   MCH 31.6 06/26/2013 0943   MCH 28.6 05/19/2013 1135   MCHC 33.2 06/04/2013 0943   MCHC 31.6* 05/19/2013 1135   RDW 21.4* 06/12/2013 0943   RDW 18.5* 05/19/2013 1135   LYMPHSABS 1.9 06/01/2013 0135   LYMPHSABS 1.4 05/19/2013 1135   MONOABS 1.1* 06/01/2013 0135   MONOABS 0.8 05/19/2013 1135   EOSABS 0.1 06/01/2013 0135   EOSABS 0.3 05/19/2013 1135   BASOSABS 0.1 06/27/2013 0135   BASOSABS 0.1 05/19/2013 1135    BMET    Component Value Date/Time   NA 133* 06/15/2013 0943   NA 140  03/16/2013 1411   K 6.9* 06/04/2013 0943   K 5.2* 03/16/2013 1411   CL 97 06/21/2013 0943   CL 105 07/28/2012 1507   CO2 20 06/03/2013 0943   CO2 29 03/16/2013 1411   GLUCOSE 184* 06/14/2013 0943   GLUCOSE 191* 03/16/2013 1411   GLUCOSE 233* 07/28/2012 1507   BUN 158* 06/07/2013 0943   BUN 95.7* 03/16/2013 1411   CREATININE 6.00* 05/31/2013 0943   CREATININE 1.6* 03/16/2013 1411   CALCIUM 8.4 06/24/2013 0943   CALCIUM 9.2 03/16/2013 1411   GFRNONAA 8* 06/20/2013 0943   GFRAA 9* 05/31/2013 0943    CMP     Component Value Date/Time   NA 133* 06/09/2013 0943   NA 140 03/16/2013 1411   K 6.9* 05/31/2013 0943   K 5.2*  03/16/2013 1411   CL 97 2013-06-22 0943   CL 105 07/28/2012 1507   CO2 20 22-Jun-2013 0943   CO2 29 03/16/2013 1411   GLUCOSE 184* 2013-06-22 0943   GLUCOSE 191* 03/16/2013 1411   GLUCOSE 233* 07/28/2012 1507   BUN 158* 06/22/13 0943   BUN 95.7* 03/16/2013 1411   CREATININE 6.00* 06-22-13 0943   CREATININE 1.6* 03/16/2013 1411   CALCIUM 8.4 06/22/13 0943   CALCIUM 9.2 03/16/2013 1411   PROT 6.0 06-22-13 0135   PROT 5.8* 01/27/2013 0823   ALBUMIN 3.4* Jun 22, 2013 0135   ALBUMIN 3.3* 01/27/2013 0823   AST 9 06-22-2013 0135   AST 14 01/27/2013 0823   ALT 19 06-22-2013 0135   ALT 26 01/27/2013 0823   ALKPHOS 69 Jun 22, 2013 0135   ALKPHOS 67 01/27/2013 0823   BILITOT 0.3 June 22, 2013 0135   BILITOT 0.73 01/27/2013 0823   GFRNONAA 8* 06/22/2013 0943   GFRAA 9* 22-Jun-2013 0943      Time In Time Out Total Time Spent with Patient Total Overall Time  0815 0930 7o min 75 min    Greater than 50%  of this time was spent counseling and coordinating care related to the above assessment and plan.  Lorinda Creed NP  Palliative Medicine Team Team Phone # [phone] Pager [phone]  Discussed with Dr Jomarie Longs

## 2013-06-17 NOTE — ED Provider Notes (Signed)
CSN: [id number]     Arrival date & time 06/06/2013  0057 History   First MD Initiated Contact with Patient 06/12/2013 0110     Chief Complaint  Patient presents with  . Abnormal Lab   (Consider location/radiation/quality/duration/timing/severity/associated sxs/prior Treatment) HPI This 79 year old full code status nursing home patient presents with severe anemia, he has a history of severe anemia with multiple transfusions, he has myelodysplastic syndrome, heart failure, diabetes, oxygen dependent, sent to the ED by nursing home for transfusion since his hemoglobin dropped from 8.5 a week ago to less than 7 on 17Dec. he is oriented to person and place denies complaints denies chest pain shortness breath abdominal pain vomiting or other concerns. He is generally weak at baseline. He denies lightheadedness. As far as I can tell from medical record apparently has had confusion for weeks. Past Medical History  Diagnosis Date  . Anemia   . Hypertension   . Diabetes mellitus   . Hyperlipemia   . Peripheral vascular disease   . Arthritis     Osteoarthritis  . Irritable bowel syndrome (IBS)   . Myelodysplasia with 5q deletion   . Thrombocytopenia     stable--due to Revlimid  . Chronic CHF 01/28/2013  . Stroke   . Depression   . Hypercalcemia    Past Surgical History  Procedure Laterality Date  . Tonsillectomy  1948  . Kidney stone surgery  1950's   History reviewed. No pertinent family history. History  Substance Use Topics  . Smoking status: Former Smoker    Quit date: 02/06/1939  . Smokeless tobacco: Never Used  . Alcohol Use: No    Review of Systems  Unable to perform ROS: Dementia   Allergies  Review of patient's allergies indicates no known allergies.  Home Medications   Current Outpatient Rx  Name  Route  Sig  Dispense  Refill  . amLODipine (NORVASC) 10 MG tablet   Oral   Take 10 mg by mouth daily.          Marland Kitchen aspirin 325 MG tablet   Oral   Take 1 tablet (325 mg  total) by mouth daily.         . carvedilol (COREG) 3.125 MG tablet   Oral   Take 1 tablet (3.125 mg total) by mouth 2 (two) times daily.   60 tablet   0   . ezetimibe-simvastatin (VYTORIN) 10-40 MG per tablet   Oral   Take 1 tablet by mouth at bedtime.         . ferrous sulfate 325 (65 FE) MG tablet   Oral   Take 325 mg by mouth 2 (two) times daily.         Marland Kitchen FLUoxetine (PROZAC) 10 MG capsule   Oral   Take 20 mg by mouth daily.          . furosemide (LASIX) 40 MG tablet   Oral   Take 40 mg by mouth daily.         Marland Kitchen HYDROcodone-acetaminophen (NORCO/VICODIN) 5-325 MG per tablet   Oral   Take 1 tablet by mouth every 4 (four) hours as needed.   30 tablet   0   . insulin aspart (NOVOLOG) 100 UNIT/ML injection   Subcutaneous   Inject 5 Units into the skin 3 (three) times daily with meals. For CBG greater than or equal to 150         . insulin detemir (LEVEMIR) 100 UNIT/ML injection   Subcutaneous  Inject 22 Units into the skin at bedtime.          Marland Kitchen ipratropium-albuterol (DUONEB) 0.5-2.5 (3) MG/3ML SOLN   Nebulization   Take 3 mLs by nebulization every 4 (four) hours as needed.         . isosorbide mononitrate (IMDUR) 30 MG 24 hr tablet   Oral   Take 1 tablet (30 mg total) by mouth daily.   30 tablet   0   . moxifloxacin (AVELOX) 400 MG tablet   Oral   Take 400 mg by mouth daily.         . Multiple Vitamin (MULTIVITAMIN WITH MINERALS) TABS tablet   Oral   Take 1 tablet by mouth daily.         Marland Kitchen MYRBETRIQ 50 MG TB24   Oral   Take 50 mg by mouth Daily.         . Teriparatide, Recombinant, (FORTEO [place])   Subcutaneous   Inject 20 mcg into the skin daily.          . vitamin B-12 1000 MCG tablet   Oral   Take 1 tablet (1,000 mcg total) by mouth daily.         . vitamin C (ASCORBIC ACID) 500 MG tablet   Oral   Take 500 mg by mouth 2 (two) times daily.          BP 120/60  Pulse 52  Temp(Src) 97.4 F (36.3 C) (Oral)  Resp 20   Ht 6' (1.829 m)  Wt 196 lb 13.9 oz (89.3 kg)  BMI 26.69 kg/m2  SpO2 90% Physical Exam  Nursing note and vitals reviewed. Constitutional:  Awake, alert, nontoxic appearance.  HENT:  Head: Atraumatic.  Eyes: Right eye exhibits no discharge. Left eye exhibits no discharge.  Neck: Neck supple.  Cardiovascular: Regular rhythm.   No murmur heard. Bradycardic but does not appear to be acutely symptomatic upon arrival  Pulmonary/Chest: Effort normal and breath sounds normal. No respiratory distress. He has no wheezes. He has no rales. He exhibits no tenderness.  Abdominal: Soft. Bowel sounds are normal. He exhibits no distension. There is no tenderness. There is no rebound and no guarding.  Genitourinary:  Greenish brown stool no gross blood noted  Musculoskeletal: He exhibits no tenderness.  Baseline ROM, no obvious new focal weakness.  Neurological: He is alert.  Mental status oriented to person and place not time and motor strength 3/5 bilat appears baseline for patient and situation. Patient moves all 4 extremities to command.  Skin: No rash noted.  Psychiatric: He has a normal mood and affect.    ED Course  Procedures (including critical care time) D/w Pt's daughter and son who have POA, they agree Pt should be DNR, DNI, no dialysis, no ICU, no invasive procedures, no heroic measures, d/w nephrology who consulted in ED, [facility] paged. [phone]  CRITICAL CARE Performed by: Hurman Horn Total critical care time: including hyperkalemia treatment Critical care time was exclusive of separately billable procedures and treating other patients. Critical care was necessary to treat or prevent imminent or life-threatening deterioration. Critical care was time spent personally by me on the following activities: development of treatment plan with patient and/or surrogate as well as nursing, discussions with consultants, evaluation of patient's response to treatment, examination of patient,  obtaining history from patient or surrogate, ordering and performing treatments and interventions, ordering and review of laboratory studies, ordering and review of radiographic studies, pulse oximetry and re-evaluation of patient's  condition. Labs Review Labs Reviewed  CBC WITH DIFFERENTIAL - Abnormal; Notable for the following:    RBC 2.27 (*)    Hemoglobin 7.2 (*)    HCT 21.7 (*)    RDW 21.4 (*)    Platelets 147 (*)    Monocytes Absolute 1.1 (*)    All other components within normal limits  COMPREHENSIVE METABOLIC PANEL - Abnormal; Notable for the following:    Sodium 134 (*)    Potassium 7.5 (*)    Glucose, Bld 182 (*)    BUN 156 (*)    Creatinine, Ser 5.67 (*)    Albumin 3.4 (*)    GFR calc non Af Amer 8 (*)    GFR calc Af Amer 9 (*)    All other components within normal limits  PRO B NATRIURETIC PEPTIDE - Abnormal; Notable for the following:    Pro B Natriuretic peptide (BNP) 24104.0 (*)    All other components within normal limits  URINALYSIS, ROUTINE W REFLEX MICROSCOPIC - Abnormal; Notable for the following:    Color, Urine AMBER (*)    APPearance CLOUDY (*)    Protein, ur 100 (*)    Leukocytes, UA SMALL (*)    All other components within normal limits  GLUCOSE, CAPILLARY - Abnormal; Notable for the following:    Glucose-Capillary 172 (*)    All other components within normal limits  GLUCOSE, CAPILLARY - Abnormal; Notable for the following:    Glucose-Capillary 184 (*)    All other components within normal limits  BASIC METABOLIC PANEL - Abnormal; Notable for the following:    Sodium 133 (*)    Potassium 6.9 (*)    Glucose, Bld 184 (*)    BUN 158 (*)    Creatinine, Ser 6.00 (*)    GFR calc non Af Amer 8 (*)    GFR calc Af Amer 9 (*)    All other components within normal limits  CBC - Abnormal; Notable for the following:    WBC 10.7 (*)    RBC 2.12 (*)    Hemoglobin 6.7 (*)    HCT 20.2 (*)    RDW 21.4 (*)    Platelets 139 (*)    All other components within  normal limits  GLUCOSE, CAPILLARY - Abnormal; Notable for the following:    Glucose-Capillary 202 (*)    All other components within normal limits  GLUCOSE, CAPILLARY - Abnormal; Notable for the following:    Glucose-Capillary 140 (*)    All other components within normal limits  POCT I-STAT TROPONIN I - Abnormal; Notable for the following:    Troponin i, poc 0.18 (*)    All other components within normal limits  MRSA PCR SCREENING  SODIUM, URINE, RANDOM  CREATININE, URINE, RANDOM  URINE MICROSCOPIC-ADD ON  OCCULT BLOOD, POC DEVICE  TYPE AND SCREEN   Imaging Review Dg Chest Port 1 View  06/15/2013   CLINICAL DATA:  Weakness, shortness of breath, history hypertension, diabetes, CHF, stroke  EXAM: PORTABLE CHEST - 1 VIEW  COMPARISON:  Portable exam 0154 hr compared to 05/05/2013  FINDINGS: Enlargement of cardiac silhouette with pulmonary vascular congestion.  Atherosclerotic calcification aorta.  Bibasilar effusions and atelectasis, unable to exclude basilar consolidation.  Minimal perihilar edema.  No pneumothorax.  Bones demineralized.  IMPRESSION: Mild CHF with bibasilar effusions and atelectasis.   Electronically Signed   By: Ulyses Southward M.D.   On: 06/02/2013 02:00    EKG Interpretation    Date/Time:  Ventricular Rate:    PR Interval:    QRS Duration:   QT Interval:    QTC Calculation:   R Axis:     Text Interpretation:            ECG: Sinus bradycardia, ventricular rate 40, short PR interval, nonspecific intraventricular conduction delay with QRS duration 116 ms, nonspecific ST changes, compared with August 2014 rate now slower, MUSE ECG not available at time of interpretation  MDM   1. Hyperkalemia   2. Renal failure   3. Heart failure   4. Elevated troponin   5. Bradycardia   6. Anemia   7. Dementia   8. Diabetes mellitus   9. Renal failure (ARF), acute on chronic   10. Dyspnea   11. Palliative care encounter   12. Increased oropharyngeal secretions     Pt critically ill, to be admitted, family aware Pt could die anytime.    Hurman Horn, MD 06/10/2013 [phone]

## 2013-06-17 NOTE — Progress Notes (Signed)
Nutrition Brief Note  Patient identified on the Malnutrition Screening Tool (MST) Report and for having low braden score. Pt with history of myelodysplastic syndrome, CHF, chronic kidney disease stage II, hypertension, diabetes mellitus was brought from the nursing home for low hemoglobin. Family requested comfort measures. Palliative care has been consulted. Pt asleep. Per conversation with RN, pt has refused to eat/drink today. Noted pt with poor prognosis. As pt under comfort measures, recommend comfort feeds per family wishes.   No nutrition interventions warranted at this time. If nutrition issues arise, please consult RD.   Levon Hedger MS, RD, LDN [phone] Pager [phone] After Hours Pager

## 2013-06-17 NOTE — Progress Notes (Signed)
Spoke to Luberta Robertson, daughter of patient Aaron Bruce, Aaron Bruce to update her on patient's declining status.  Patient remains lethargic, O2 sats 90% on 3L via n/c, O2 sats 94% via n/c on 3.5L, pulse 36, urine output amber and clear 150 ml.  Steward Drone informed the nurse that the family was present during the day discussing end-of-life with the palliative care nurse and they will not visit tonight

## 2013-06-17 NOTE — ED Notes (Signed)
Patient medicated, see MAR Patient denies complaints or further needs at this time

## 2013-06-17 NOTE — H&P (Signed)
[facility] History and Physical  Aaron Bruce ZOX:[id number] DOB: 1925/04/25 DOA: 2013-07-10  Referring physician: ER physician. PCP: Michiel Sites, MD   Chief Complaint: Abnormal labs.  HPI: Aaron Bruce is a 79 y.o. male with known history of myelodysplastic syndrome, CHF, chronic kidney disease stage II, hypertension, diabetes mellitus was brought from the nursing home for low hemoglobin. On arrival patient's labs show creatinine to be significantly elevated with hyperkalemia. EKG shows sinus bradycardia with no T-wave changes. Dr. Fonnie Jarvis, ER physician had discussed with patient's daughter and son. At this time patient's daughter and son has requested minimal interventions like correcting the potassium and keeping patient comfortable and have make patient DO NOT RESUSCITATE and does not want patient to be having a dialysis or any other active interventions. On-call nephrologist Dr. Briant Cedar also has seen the patient. At this time patient was given calcium gluconate D50 IV insulin sodium bicarbonate and Kayexalate for his hyperkalemia. One dose of IV Lasix 160 mg has been ordered. Patient will be admitted to regular floor and the main goal would be comfort measures. Patient on my exam is mildly short of breath and response to his name and moves all extremities. Has some mild jerks. Denies any pain. Patient does have significant anemia and has known history of myelodysplastic syndrome.   Review of Systems: As presented in the history of presenting illness, rest negative.  Past Medical History  Diagnosis Date  . Anemia   . Hypertension   . Diabetes mellitus   . Hyperlipemia   . Peripheral vascular disease   . Arthritis     Osteoarthritis  . Irritable bowel syndrome (IBS)   . Myelodysplasia with 5q deletion   . Thrombocytopenia     stable--due to Revlimid  . Chronic CHF 01/28/2013  . Stroke   . Depression   . Hypercalcemia    Past Surgical History  Procedure  Laterality Date  . Tonsillectomy  1948  . Kidney stone surgery  1950's   Social History:  reports that he quit smoking about 74 years ago. He has never used smokeless tobacco. He reports that he does not drink alcohol or use illicit drugs. Where does patient live nursing home. Can patient participate in ADLs? Not sure.  No Known Allergies  Family History: History reviewed. No pertinent family history.    Prior to Admission medications   Medication Sig Start Date End Date Taking? Authorizing Provider  amLODipine (NORVASC) 10 MG tablet Take 10 mg by mouth daily.    Yes Historical Provider, MD  aspirin 325 MG tablet Take 1 tablet (325 mg total) by mouth daily. 08/24/12  Yes Adeline Joselyn Glassman, MD  carvedilol (COREG) 3.125 MG tablet Take 1 tablet (3.125 mg total) by mouth 2 (two) times daily. 02/08/13  Yes Rhetta Mura, MD  ezetimibe-simvastatin (VYTORIN) 10-40 MG per tablet Take 1 tablet by mouth at bedtime.   Yes Historical Provider, MD  ferrous sulfate 325 (65 FE) MG tablet Take 325 mg by mouth 2 (two) times daily.   Yes Historical Provider, MD  FLUoxetine (PROZAC) 10 MG capsule Take 20 mg by mouth daily.    Yes Historical Provider, MD  furosemide (LASIX) 40 MG tablet Take 40 mg by mouth daily. 02/08/13  Yes Rhetta Mura, MD  HYDROcodone-acetaminophen (NORCO/VICODIN) 5-325 MG per tablet Take 1 tablet by mouth every 4 (four) hours as needed. 08/24/12  Yes Adeline C Viyuoh, MD  insulin aspart (NOVOLOG) 100 UNIT/ML injection Inject 5 Units into the skin  3 (three) times daily with meals. For CBG greater than or equal to 150 08/24/12  Yes Adeline C Viyuoh, MD  insulin detemir (LEVEMIR) 100 UNIT/ML injection Inject 22 Units into the skin at bedtime.    Yes Historical Provider, MD  ipratropium-albuterol (DUONEB) 0.5-2.5 (3) MG/3ML SOLN Take 3 mLs by nebulization every 4 (four) hours as needed.   Yes Historical Provider, MD  isosorbide mononitrate (IMDUR) 30 MG 24 hr tablet Take 1 tablet (30  mg total) by mouth daily. 02/08/13  Yes Rhetta Mura, MD  moxifloxacin (AVELOX) 400 MG tablet Take 400 mg by mouth daily. 06/11/13 06/20/13 Yes Historical Provider, MD  Multiple Vitamin (MULTIVITAMIN WITH MINERALS) TABS tablet Take 1 tablet by mouth daily.   Yes Historical Provider, MD  MYRBETRIQ 50 MG TB24 Take 50 mg by mouth Daily. 04/24/12  Yes Historical Provider, MD  Teriparatide, Recombinant, (FORTEO [place]) Inject 20 mcg into the skin daily.    Yes Historical Provider, MD  vitamin B-12 1000 MCG tablet Take 1 tablet (1,000 mcg total) by mouth daily. 08/24/12  Yes Adeline Joselyn Glassman, MD  vitamin C (ASCORBIC ACID) 500 MG tablet Take 500 mg by mouth 2 (two) times daily.   Yes Historical Provider, MD    Physical Exam: Filed Vitals:   06/24/2013 0339 06/11/2013 0407 06/16/2013 0430 06/05/2013 0500  BP:  126/39 129/40 138/45  Pulse: 39 39 43 43  Temp:      TempSrc:      Resp: 15 16 19 18   SpO2: 94% 92% 95% 93%     General:  Well-developed well-nourished.  Eyes: Mild pallor no icterus.  ENT: No discharge from the ears eyes nose mouth.  Neck: No mass felt.  Cardiovascular: S1-S2 heard.  Respiratory: No rhonchi or crepitations.  Abdomen: Soft nontender bowel sounds present.  Skin: Chronic skin changes.  Musculoskeletal: Bilateral lower extremity edema.  Psychiatric: Patient is arousable and follows commands.  Neurologic: Moves all extremities.  Labs on Admission:  Basic Metabolic Panel:  Recent Labs Lab  0135  NA 134*  K 7.5*  CL 97  CO2 21  GLUCOSE 182*  BUN 156*  CREATININE 5.67*  CALCIUM 8.7   Liver Function Tests:  Recent Labs Lab 06/27/2013 0135  AST 9  ALT 19  ALKPHOS 69  BILITOT 0.3  PROT 6.0  ALBUMIN 3.4*   No results found for this basename: LIPASE, AMYLASE,  in the last 168 hours No results found for this basename: AMMONIA,  in the last 168 hours CBC:  Recent Labs Lab 06/22/2013 0135  WBC 10.5  NEUTROABS 7.3  HGB 7.2*  HCT 21.7*  MCV  95.6  PLT 147*   Cardiac Enzymes: No results found for this basename: CKTOTAL, CKMB, CKMBINDEX, TROPONINI,  in the last 168 hours  BNP (last 3 results)  Recent Labs  01/27/13 0823 02/05/13 1518 06/29/2013 0145  PROBNP 4580* 10787.0* 24104.0*   CBG: No results found for this basename: GLUCAP,  in the last 168 hours  Radiological Exams on Admission: Dg Chest Port 1 View  06/16/2013   CLINICAL DATA:  Weakness, shortness of breath, history hypertension, diabetes, CHF, stroke  EXAM: PORTABLE CHEST - 1 VIEW  COMPARISON:  Portable exam 0154 hr compared to 05/05/2013  FINDINGS: Enlargement of cardiac silhouette with pulmonary vascular congestion.  Atherosclerotic calcification aorta.  Bibasilar effusions and atelectasis, unable to exclude basilar consolidation.  Minimal perihilar edema.  No pneumothorax.  Bones demineralized.  IMPRESSION: Mild CHF with bibasilar effusions and atelectasis.  Electronically Signed   By: Ulyses Southward M.D.   On: 06/16/2013 02:00    EKG: Independently reviewed. Sinus bradycardia.  Assessment/Plan Principal Problem:   Renal failure (ARF), acute on chronic Active Problems:   MDS (myelodysplastic syndrome) with 5q deletion   Chronic CHF   Elevated troponin   Hyperkalemia   Diabetes mellitus   1. Acute on chronic renal failure with hyperkalemia - presently calcium gluconate, D50, IV insulin, sodium bicarbonate, Kayexalate and IV Lasix 160 mg has been ordered. Repeat metabolic panel has been ordered at 10 AM. I have discussed with patient's son [name] and with nephrologist Dr. Briant Cedar. At this time the goals are to keep patient comfortable with minimal labs. Patient is a DO NOT RESUSCITATE. Family understands that patient's condition can further worsen and can have cardiac arrest from the present condition and did not want any dialysis. 2. Acute CHF with known history of diastolic CHF - patient has been placed on Lasix 160 mg IV one dose. May need further doses  based on patient's response. 3. Elevated troponins - probably secondary to #1 and #2 reason. I have not ordered further troponins as patient's family did not want any further aggressive measures and labs except check patient's potassium levels. 4. Severe anemia with known history of myelodysplastic syndrome - presently patient was fluid overloaded and is not in a state to receive blood transfusion. Follow CBC repeat one has been ordered 10 AM today. 5. Diabetes mellitus type 2 - at this time I have only placed patient on CBG with sliding scale due to acute renal failure. Closely follow CBGs. 6. Hypertension - continue Norvasc Imdur but due to sinus bradycardia I have held Coreg.  I have reviewed patient's old charts, nursing home records. I have discussed with on-call nephrologist Dr. Briant Cedar and patient's son [name] through the phone - [phone], and independently reviewed chest x-ray and EKG. I have consulted hospice.   Code Status: DO NOT RESUSCITATE.  Family Communication: Patient's son.  Disposition Plan: Admit to inpatient.    KAKRAKANDY,ARSHAD N. [facility] Pager [phone].  If 7PM-7AM, please contact night-coverage [email or web address] Password [facility] 06/29/2013, 5:45 AM

## 2013-06-17 NOTE — ED Notes (Signed)
Dr. Bednar at bedside. 

## 2013-06-17 NOTE — Consult Note (Signed)
Aaron Bruce is an 79 y.o. male referred by Dr Fonnie Jarvis   Chief Complaint: ARF, Hyperkalemia HPI: Aaron Bruce sent to [facility] ER from his nursing home for "abnormal lab".   CBC done there showed hg 6.7.  In ER further labs showed Scr 5.6 and K 7.5.  Baseline Scr mid 1's.  Pt not a great historian.  PO intake questionable.  Not on any meds predisposing to hyperkalemia. Hx HTN and DM but of ? Duration.  Past Medical History  Diagnosis Date  . Anemia   . Hypertension   . Diabetes mellitus   . Hyperlipemia   . Peripheral vascular disease   . Arthritis     Osteoarthritis  . Irritable bowel syndrome (IBS)   . Myelodysplasia with 5q deletion   . Thrombocytopenia     stable--due to Revlimid  . Chronic CHF 01/28/2013  . Stroke   . Depression   . Hypercalcemia     Past Surgical History  Procedure Laterality Date  . Tonsillectomy  1948  . Kidney stone surgery  1950's    History reviewed. No pertinent family history. No family history of renal failure per pt  Social History:  reports that he quit smoking about 74 years ago. He has never used smokeless tobacco. He reports that he does not drink alcohol or use illicit drugs. Resides at [facility]  Allergies: No Known Allergies   (Not in a hospital admission)   Lab Results: UA: ND   Recent Labs  06/28/2013 0135  WBC 10.5  HGB 7.2*  HCT 21.7*  PLT 147*   BMET  Recent Labs  06/06/2013 0135  NA 134*  K 7.5*  CL 97  CO2 21  GLUCOSE 182*  BUN 156*  CREATININE 5.67*  CALCIUM 8.7   LFT  Recent Labs  06/16/2013 0135  PROT 6.0  ALBUMIN 3.4*  AST 9  ALT 19  ALKPHOS 69  BILITOT 0.3   Dg Chest Port 1 View  06/15/2013   CLINICAL DATA:  Weakness, shortness of breath, history hypertension, diabetes, CHF, stroke  EXAM: PORTABLE CHEST - 1 VIEW  COMPARISON:  Portable exam 0154 hr compared to 05/05/2013  FINDINGS: Enlargement of cardiac silhouette with pulmonary vascular congestion.  Atherosclerotic calcification aorta.   Bibasilar effusions and atelectasis, unable to exclude basilar consolidation.  Minimal perihilar edema.  No pneumothorax.  Bones demineralized.  IMPRESSION: Mild CHF with bibasilar effusions and atelectasis.   Electronically Signed   By: Ulyses Southward M.D.   On: 06/14/2013 02:00    ROS: No change in vision No CP Chronic SOB and on O2 for last few weeks according to pt No Abd pain No new arthritic CO   PHYSICAL EXAM: Blood pressure 126/39, pulse 39, temperature 98 F (36.7 C), temperature source Oral, resp. rate 16, SpO2 92.00%. HEENT: PERRLA EOMI NECK:no bruits LUNGS:decreased BS throughout, few basilar crackles bil CARDIAC:bradycardic, regular no murmur, rub or gallop ABD:+ BS NTND EXT:tr-1+ edema in legs.  + edema Rt arm NEURO:CNI M&SI,  Follows simple commands.  Ox2 (person and place).  Thinks it is 1962  Assessment: 1. Acute on CKD.  Not sure the exact  Cause as Hx is difficult. ? Intravascular volume depletion vs poor renal perfusion from bradycardia 2. Hyperkalemia 3. Bradycardia.  EKG does not show peaked t waves and the ? Is whether the carvedilol caused the brady and then ARF or if the hyperkalemia is causing the bradycardia 4. Anemia  Presumably sec myelodysplastic syndrome 5.  HTN 6. DM PLAN: 1. ER doctor, Dr Fonnie Jarvis, has contacted family and their wishes are for DNR and non aggressive tx of his hyperkalemia and ARF (ie no HD, no ICU). 2. Foley cath 3. IV lasix 4. Po kayexalate 5. DC carvedilol 6. Check UA 7. Repeat K later today 8. Can get renal US after K controlled  9. Hold off on any transfusion until K controlled.    Aaron Bruce T 06/25/2013, 4:55 AM

## 2013-06-17 NOTE — ED Notes (Signed)
Patient arrives from [facility] via EMS for abnormal labs r/t H/H 6.7/20.7 Labs obtained on 06/16/2013 at 0630 Patient alert and oriented x 4 and denies needs or complaints at this time

## 2013-06-17 NOTE — Progress Notes (Signed)
Informational Drug:Drug interaction:  MD, The order for simvastatin 40mg  (as part of patient's home Vytorin) was changed to an equivalent dose of Lipitor (20mg ).  Simvastatin dose should not exceed 20mg /day in patients taking amlodipine or amiodarone to avoid increased risk of liver toxicity, myopathy, or rhabdomyolysis.  The switch to Lipitor was made to avoid this interaction.  Please consider this potential interaction at discharge.   Thanks, Lorenza Evangelist 06/25/2013 7:08 AM

## 2013-06-17 NOTE — Progress Notes (Signed)
UR completed 

## 2013-06-17 NOTE — ED Notes (Signed)
Bed: WU98 Expected date:  Expected time:  Means of arrival:  Comments: EMS low Hgb

## 2013-06-18 ENCOUNTER — Encounter: Payer: Self-pay | Admitting: Internal Medicine

## 2013-06-18 DIAGNOSIS — K117 Disturbances of salivary secretion: Secondary | ICD-10-CM

## 2013-06-18 DIAGNOSIS — Z515 Encounter for palliative care: Secondary | ICD-10-CM

## 2013-06-18 DIAGNOSIS — N19 Unspecified kidney failure: Secondary | ICD-10-CM

## 2013-06-18 DIAGNOSIS — F0390 Unspecified dementia without behavioral disturbance: Secondary | ICD-10-CM

## 2013-06-18 DIAGNOSIS — K1370 Unspecified lesions of oral mucosa: Secondary | ICD-10-CM

## 2013-06-18 MED ORDER — MORPHINE SULFATE 2 MG/ML IJ SOLN: 1.0000 mg | INTRAMUSCULAR | Status: DC | PRN

## 2013-06-18 MED ORDER — LORAZEPAM 1 MG PO TABS: 1.0000 mg | ORAL_TABLET | ORAL | Status: DC | PRN

## 2013-06-18 MED ORDER — MORPHINE SULFATE (CONCENTRATE) 10 MG /0.5 ML PO SOLN: 5.0000 mg | ORAL | Status: DC | PRN

## 2013-06-18 MED ORDER — MORPHINE SULFATE 2 MG/ML IJ SOLN: 1.0000 mg | Freq: Four times a day (QID) | INTRAMUSCULAR | Status: DC

## 2013-06-18 MED ORDER — SCOPOLAMINE 1 MG/3DAYS TD PT72
1.0000 | MEDICATED_PATCH | TRANSDERMAL | Status: DC
  Filled 2013-06-18: qty 1

## 2013-07-01 NOTE — Discharge Summary (Signed)
Death Summary  Aaron Bruce ZOX:[id number] DOB: 12/03/1924 DOA: Jul 03, 2013  PCP: Michiel Sites, MD  Admit date: 07-03-2013 Date of Death: 07/04/2013  Final Diagnoses:  Principal Problem:   Renal failure (ARF), acute on chronic Active Problems:   MDS (myelodysplastic syndrome) with 5q deletion   Chronic CHF   Elevated troponin   Hyperkalemia   Diabetes mellitus   Palliative care encounter   Increased oropharyngeal secretions    History of present illness:  Chief Complaint: Abnormal labs.  HPI: Aaron Bruce is a 80 y.o. male with known history of myelodysplastic syndrome, CHF, chronic kidney disease stage II, hypertension, diabetes mellitus was brought from the nursing home for low hemoglobin. On arrival patient's labs show creatinine to be significantly elevated with hyperkalemia. EKG shows sinus bradycardia with no T-wave changes. Dr. Fonnie Jarvis, ER physician had discussed with patient's daughter and son. At this time patient's daughter and son has requested minimal interventions like correcting the potassium and keeping patient comfortable and have make patient DO NOT RESUSCITATE and does not want patient to be having a dialysis or any other active interventions. On-call nephrologist Dr. Briant Cedar also has seen the patient. At this time patient was given calcium gluconate D50 IV insulin sodium bicarbonate and Kayexalate for his hyperkalemia. One dose of IV Lasix 160 mg has been ordered. Patient will be admitted to regular floor and the main goal would be comfort measures. Patient on my exam is mildly short of breath and response to his name and moves all extremities. Has some mild jerks. Denies any pain. Patient does have significant anemia and has known history of myelodysplastic syndrome   Hospital Course:  Admitted with worsening renal failure, anemia, MDS, seen by renal not a dialysis candidate, then had palliative care meeting and decision was made to pursue comfort care  only. He expired Jul 05, 2023   Time:62min Signed:  Zannie Cove  [facility] 06/24/2013, 1:00 PM

## 2013-07-01 NOTE — Progress Notes (Signed)
Above noted, pt is now comfort care. Will sign off.  Vinson Moselle MD [employer] (pgr) [phone]    [phone] 06/05/2013, 8:56 AM

## 2013-07-01 NOTE — Progress Notes (Signed)
Pt seen and examined,  Has worsening ARF with hyperkalemia, not HD candidate, elevated troponin, anemia, MDS  Obtunded, moaning Palliative following, comfort care now Awaiting Hospice/SNF  Zannie Cove, MD [phone]

## 2013-07-01 NOTE — Progress Notes (Addendum)
Patient pronounced at 1645 pm June 24, 2013 no pulse verified by two registered nurses Loyola Santino RN and Luanna Cole RN, MD notified and [facility] notified, attempted to notify the patient's wife and son in law answered, son of patient notified and stated that he would be on his way up Stanford Breed RN 06-24-2013

## 2013-07-01 NOTE — Progress Notes (Signed)
Progress Note from the Palliative Medicine Team at [facility]  Subjective: patient is minimally responsive at this time to gentle touch and verbal stimuli, appears comfortable  -call placed to son Patton Salles, family understands the limited prognosis and hope is for continued comfort and dignity  -prognosis is likely hrs to days will will continue to focus on comfort  Objective: No Known Allergies Scheduled Meds: . furosemide  160 mg Intravenous TID  . sodium chloride  3 mL Intravenous Q12H   Continuous Infusions:  PRN Meds:.acetaminophen, acetaminophen, albuterol, bisacodyl, ipratropium, LORazepam, LORazepam, morphine injection, morphine CONCENTRATE, ondansetron (ZOFRAN) IV, ondansetron  BP 116/63  Pulse 42  Temp(Src) 97.5 F (36.4 C) (Oral)  Resp 18  Ht 6' (1.829 m)  Wt 89.3 kg (196 lb 13.9 oz)  BMI 26.69 kg/m2  SpO2 90%   PPS:10 %     Intake/Output Summary (Last 24 hours) at 07-17-13 1340 Last data filed at July 17, 2013 1000  Gross per 24 hour  Intake    240 ml  Output    400 ml  Net   -160 ml       Physical Exam:  General: ,appears comfortable, prognosis is limited HEENT:  Dry buccal membranes, audible throat secretions Chest:  Diminished in bases, scattered coarse BS CVS: bradycardic Abdomen:  Soft NT decreased BS Ext: BLE +1 edema Skin: warm ,no mottling notd Neuro: unresponsive to gentle tocuh and verbal stimuli  Labs: CBC    Component Value Date/Time   WBC 10.7* 05/31/2013 0943   WBC 6.2 05/19/2013 1135   RBC 2.12* 06/15/2013 0943   RBC 2.71* 05/19/2013 1135   HGB 6.7* 06/29/2013 0943   HGB 7.8* 05/19/2013 1135   HCT 20.2* 06/03/2013 0943   HCT 24.6* 05/19/2013 1135   PLT 139* 06/05/2013 0943   PLT 129* 05/19/2013 1135   MCV 95.3 06/03/2013 0943   MCV 90.5 05/19/2013 1135   MCH 31.6 06/05/2013 0943   MCH 28.6 05/19/2013 1135   MCHC 33.2 06/26/2013 0943   MCHC 31.6* 05/19/2013 1135   RDW 21.4* 06/15/2013 0943   RDW 18.5* 05/19/2013 1135   LYMPHSABS 1.9 06/22/2013 0135   LYMPHSABS 1.4 05/19/2013 1135   MONOABS 1.1* 06/23/2013 0135   MONOABS 0.8 05/19/2013 1135   EOSABS 0.1 06/25/2013 0135   EOSABS 0.3 05/19/2013 1135   BASOSABS 0.1 06/21/2013 0135   BASOSABS 0.1 05/19/2013 1135    BMET    Component Value Date/Time   NA 133* 06/08/2013 0943   NA 140 03/16/2013 1411   K 6.9* 06/14/2013 0943   K 5.2* 03/16/2013 1411   CL 97 06/05/2013 0943   CL 105 07/28/2012 1507   CO2 20 06/15/2013 0943   CO2 29 03/16/2013 1411   GLUCOSE 184* 06/08/2013 0943   GLUCOSE 191* 03/16/2013 1411   GLUCOSE 233* 07/28/2012 1507   BUN 158* 06/16/2013 0943   BUN 95.7* 03/16/2013 1411   CREATININE 6.00* 06/27/2013 0943   CREATININE 1.6* 03/16/2013 1411   CALCIUM 8.4 06/29/2013 0943   CALCIUM 9.2 03/16/2013 1411   GFRNONAA 8* 06/16/2013 0943   GFRAA 9* 06/27/2013 0943    CMP     Component Value Date/Time   NA 133* 06/23/2013 0943   NA 140 03/16/2013 1411   K 6.9* 06/01/2013 0943   K 5.2* 03/16/2013 1411   CL 97 06/22/2013 0943   CL 105 07/28/2012 1507   CO2 20 06/17/2013 0943   CO2 29 03/16/2013 1411   GLUCOSE 184* 06/17/2013 1610  GLUCOSE 191* 03/16/2013 1411   GLUCOSE 233* 07/28/2012 1507   BUN 158* 06/13/2013 0943   BUN 95.7* 03/16/2013 1411   CREATININE 6.00* 06/02/2013 0943   CREATININE 1.6* 03/16/2013 1411   CALCIUM 8.4 06/09/2013 0943   CALCIUM 9.2 03/16/2013 1411   PROT 6.0 06/22/2013 0135   PROT 5.8* 01/27/2013 0823   ALBUMIN 3.4* 06/26/2013 0135   ALBUMIN 3.3* 01/27/2013 0823   AST 9 06/13/2013 0135   AST 14 01/27/2013 0823   ALT 19 06/16/2013 0135   ALT 26 01/27/2013 0823   ALKPHOS 69 06/29/2013 0135   ALKPHOS 67 01/27/2013 0823   BILITOT 0.3 05/31/2013 0135   BILITOT 0.73 01/27/2013 0823   GFRNONAA 8* 06/01/2013 0943   GFRAA 9* 06/22/2013 0943     Assessment and Plan: 1. Code Status:DNR/DNI-comfort is main focus of care 2. Symptom Control:                    -Pain/Dypnea: Morphine 1 mg every 6 hr scheduled                     -Agitation: Ativan 1 mg every 4 hrs prn                    -Terminal secretions Scopolamine patch 3. Psycho/Social: Emotional support offered to family 4. Spiritual [employer] support 5. Disposition: Prognosis is likley hrs to days, focus is comfort  Patient Documents Completed or Given: Document Given Completed  Advanced Directives Pkt    MOST    DNR    Gone from My Sight    Hard Choices yes      Lorinda Creed NP  Palliative Medicine Team Team Phone # [phone] Pager [phone]  Discussed with Dr Jomarie Longs  1

## 2013-07-01 NOTE — Consult Note (Signed)
I have reviewed and discussed the care of this patient in detail with the nurse practitioner including pertinent patient records, physical exam findings and data. I agree with details of this encounter.  

## 2013-07-01 NOTE — Progress Notes (Signed)
Clinical Social Work Department BRIEF PSYCHOSOCIAL ASSESSMENT 06/21/2013  Patient:  Aaron Bruce, Aaron Bruce     Account Number:  [id number]     Admit date:  06/24/2013  Clinical Social Worker:  Candie Chroman  Date/Time:  06/29/2013 02:12 PM  Referred by:  Physician  Date Referred:  06/20/2013 Referred for  SNF Placement   Other Referral:   Interview type:   Other interview type:    PSYCHOSOCIAL DATA Living Status:  FACILITY Admitted from facility:  [facility] [place] Level of care:  Skilled Nursing Facility Primary support name:  Patton Salles Primary support relationship to patient:  CHILD, MINOR Degree of support available:   supportive    CURRENT CONCERNS  Other Concerns:    SOCIAL WORK ASSESSMENT / PLAN Pt is an 80 yr old gentleman admitted from [facility] . Pt is being followed by Palliative Care. CSW contacted by Lillette Boxer, to discuss d/c planning. Pt has DNR / CMO orders. Pt's prognosis is poor. GIP vs Hospice Home vs SNF with hospice is being considered. No decision has been made at this time. CSW will continue to follow to assist with d/c planning.   Assessment/plan status:  Psychosocial Support/Ongoing Assessment of Needs Other assessment/ plan:   Information/referral to community resources:   Palliative Care is involved.    PATIENT'S/FAMILY'S RESPONSE TO PLAN OF CARE: No definate d/c plan is in place at this time.   Cori Razor LCSW [phone]

## 2013-07-01 DEATH — deceased

## 2013-07-05 ENCOUNTER — Ambulatory Visit: Payer: Medicare Other | Admitting: Nurse Practitioner

## 2013-07-05 ENCOUNTER — Other Ambulatory Visit: Payer: Medicare Other

## 2013-07-09 NOTE — Progress Notes (Signed)
This encounter was created in error - please disregard.

## 2013-08-21 ENCOUNTER — Encounter: Payer: Self-pay | Admitting: Internal Medicine

## 2013-08-29 ENCOUNTER — Encounter: Payer: Self-pay | Admitting: Internal Medicine

## 2013-10-15 IMAGING — XA IR DG VERTEBROPLASTY FL
1 series · 14 of 17 positions shown · IV contrast (IODINE)
Comparison: MRI of the lumbosacral spine of 05/29/2012.

CLINICAL DATA: Painful compression fracture at L5.

VERTEBROPLASTY AT L5

[Series 300: spine · 14 of 17 slices shown]
[im 1/17]
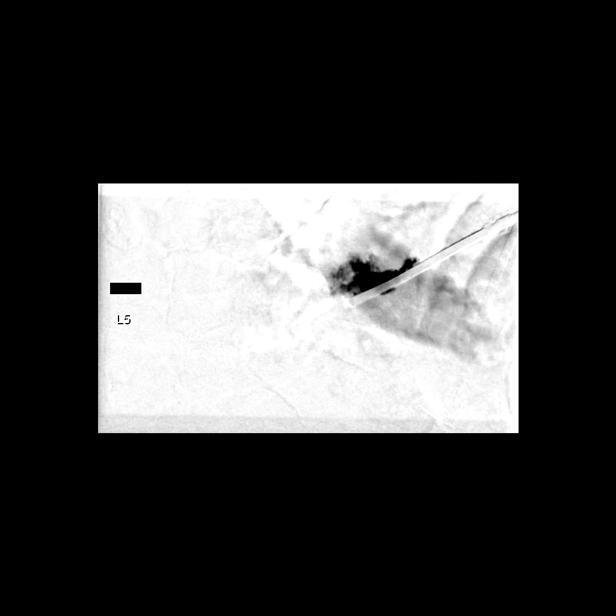
[im 2/17]
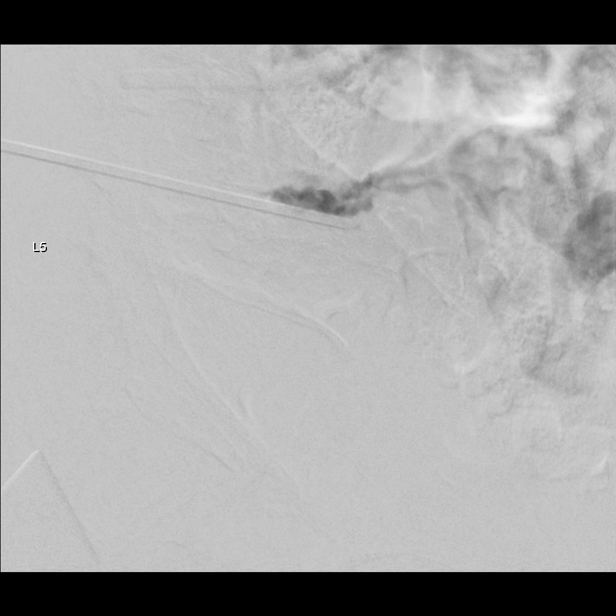
[im 4/17]
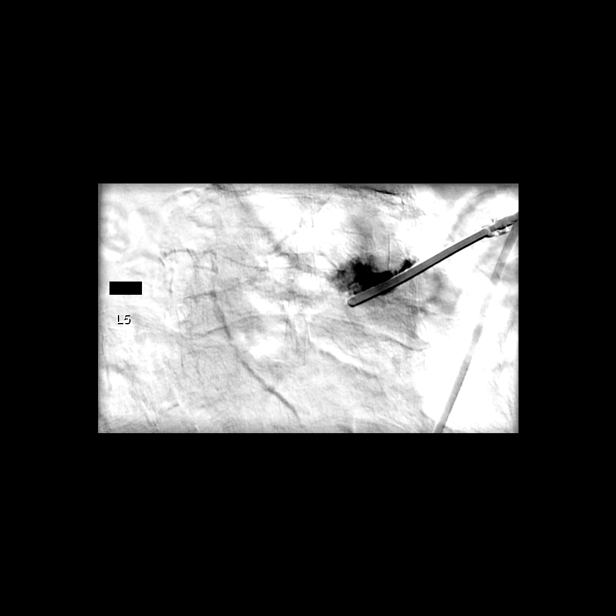
[im 5/17]
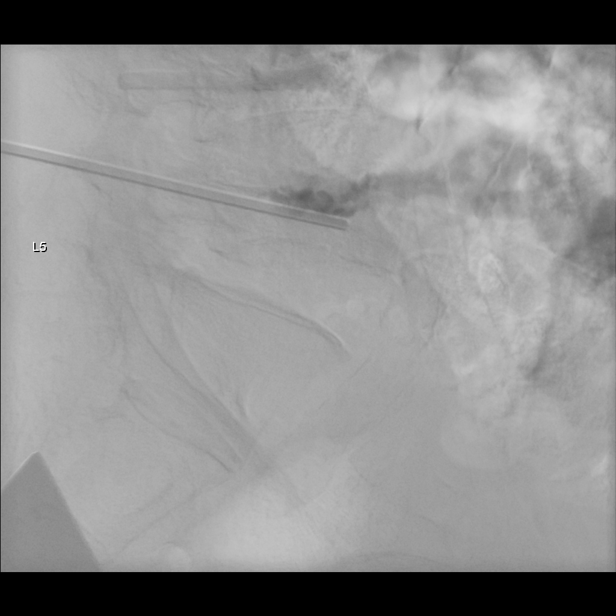
[im 6/17]
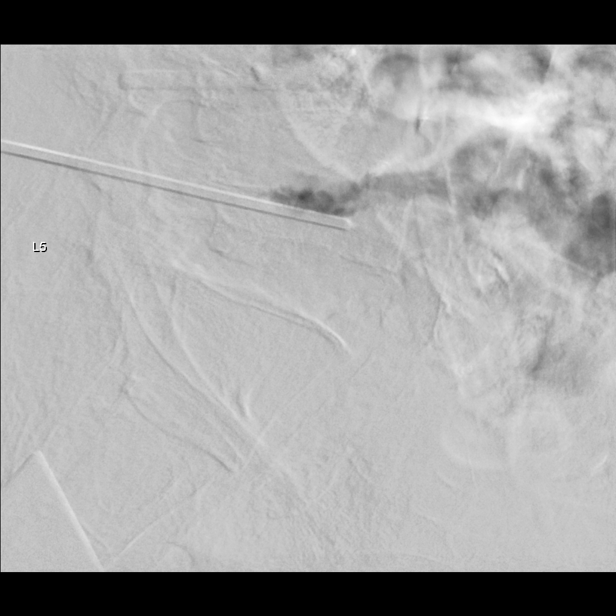
[im 7/17]
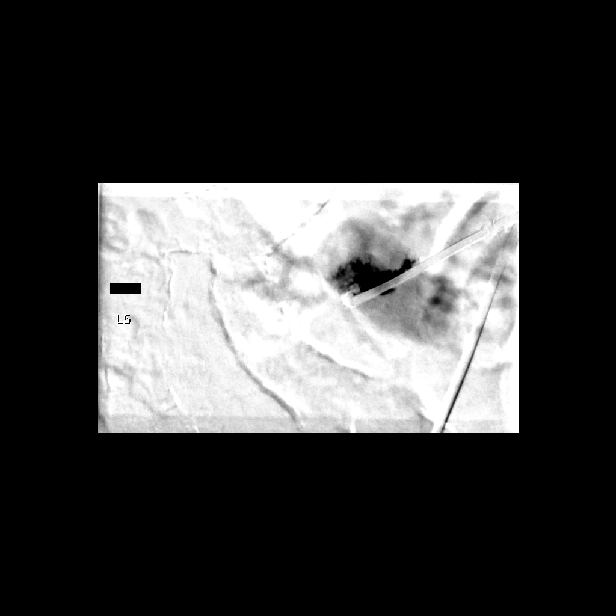
[im 8/17]
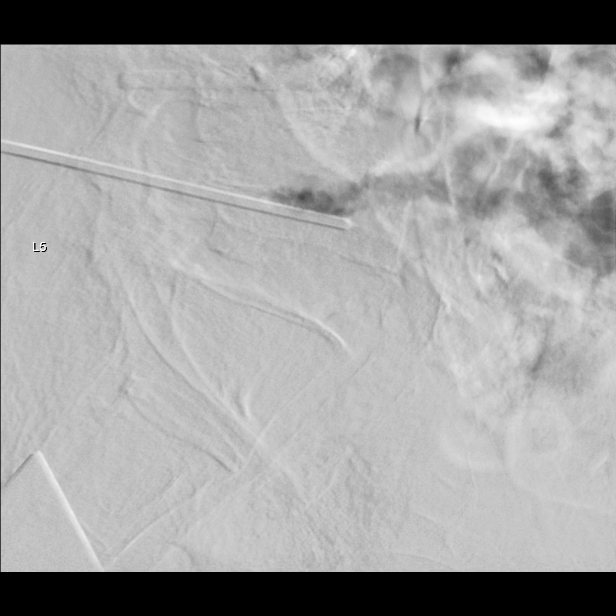
[im 10/17]
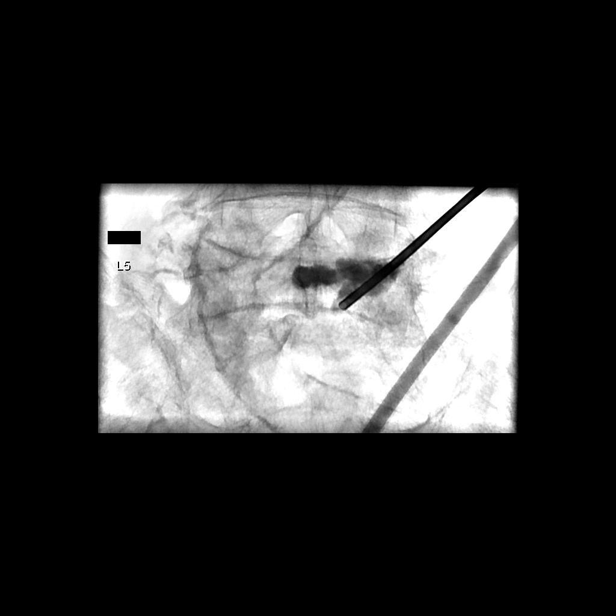
[im 11/17]
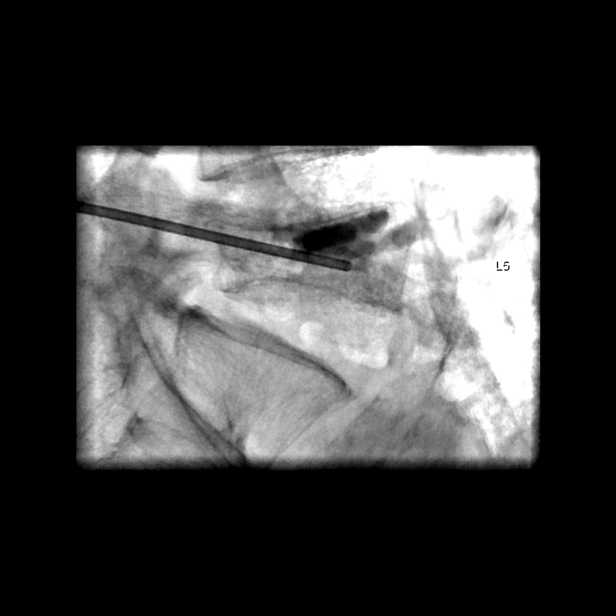
[im 12/17]
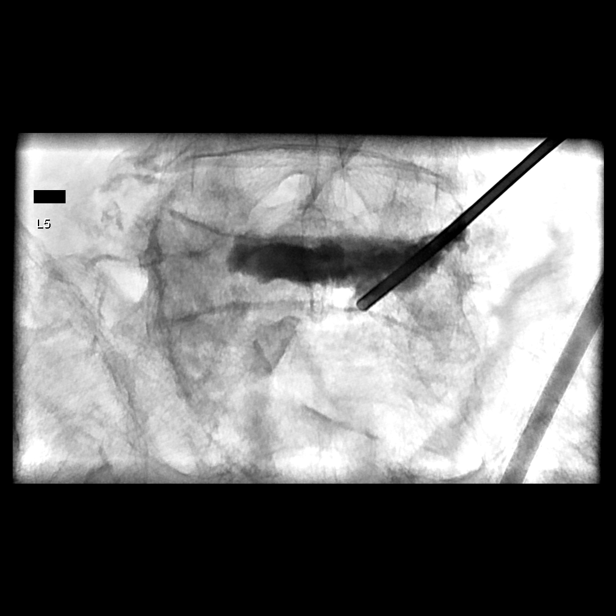
[im 13/17]
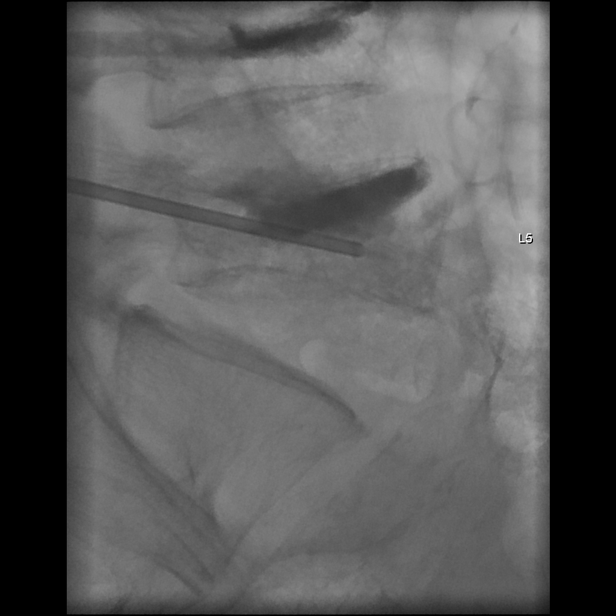
[im 14/17]
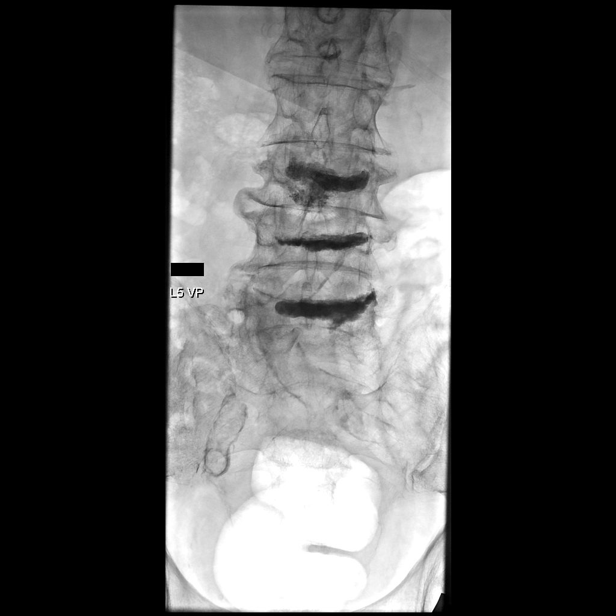
[im 16/17]
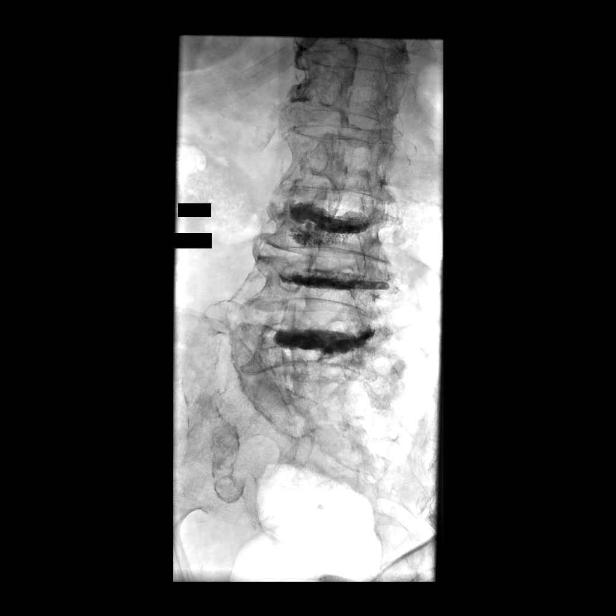
[im 17/17]
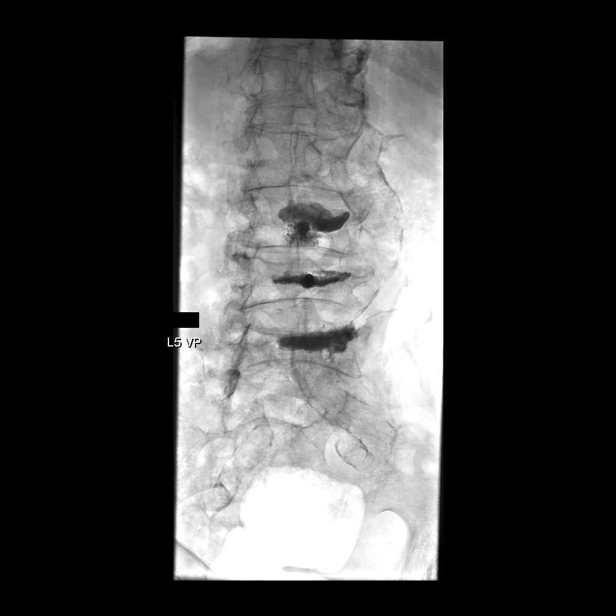

[14 of 17 positions shown; findings below may reference images not displayed]

Following a full explanation of the procedure along with the
potential associated complications, an informed witnessed consent
was obtained.

The patient was placed prone on the fluoroscopic table.

The skin overlying the lumbosacral region was then prepped in the
usual sterile fashion.

The skin entry site overlying the right pedicle at L5 was then
infiltrated with 0.25% bupivacaine and extended into the right
pedicle.

Using biplane intermittent fluoroscopy, a 13-gauge Steffen spine
needle was then advanced into the junction of the anterior and
middle thirds of the L5 vertebral body.

A gentle contrast injection demonstrated a trabecular pattern of
contrast enhancement.

Methylmethacrylate mixture was reconstituted with Tobramycin in the
Isaacs delivery device system.

Using biplane intermittent fluoroscopy via microtubing connected to
the Steffen spine needle, methylmethacrylate mixture was injected at
L5 with the Isaacs delivery device system.  Excellent filling was
obtained in the AP and lateral projections.  Crossing of the
midline was achieved. There was no extrusion noted into disc spaces
or posteriorly into the spinal canal.  No paraspinous venous
contamination was seen.

The needle was then removed.  Hemostasis was achieved at the skin
entry site.

The patient tolerated the procedure well.  There were no acute
complications.

Medication utilized: Versed 1 mg IV.  Fentanyl 25 mcg IV.

IMPRESSION
1.  Status post fluoroscopic-guided vertebral body augmentation for
painful compression fracture at L5 using the vertebroplasty
technique.

## 2013-11-02 IMAGING — CR DG CHEST 1V PORT
1 series · 1 of 1 positions shown · non-contrast
Comparison: None.

CLINICAL DATA: Shortness of breath.  History of hypertension

PORTABLE CHEST - 1 VIEW

[AP]
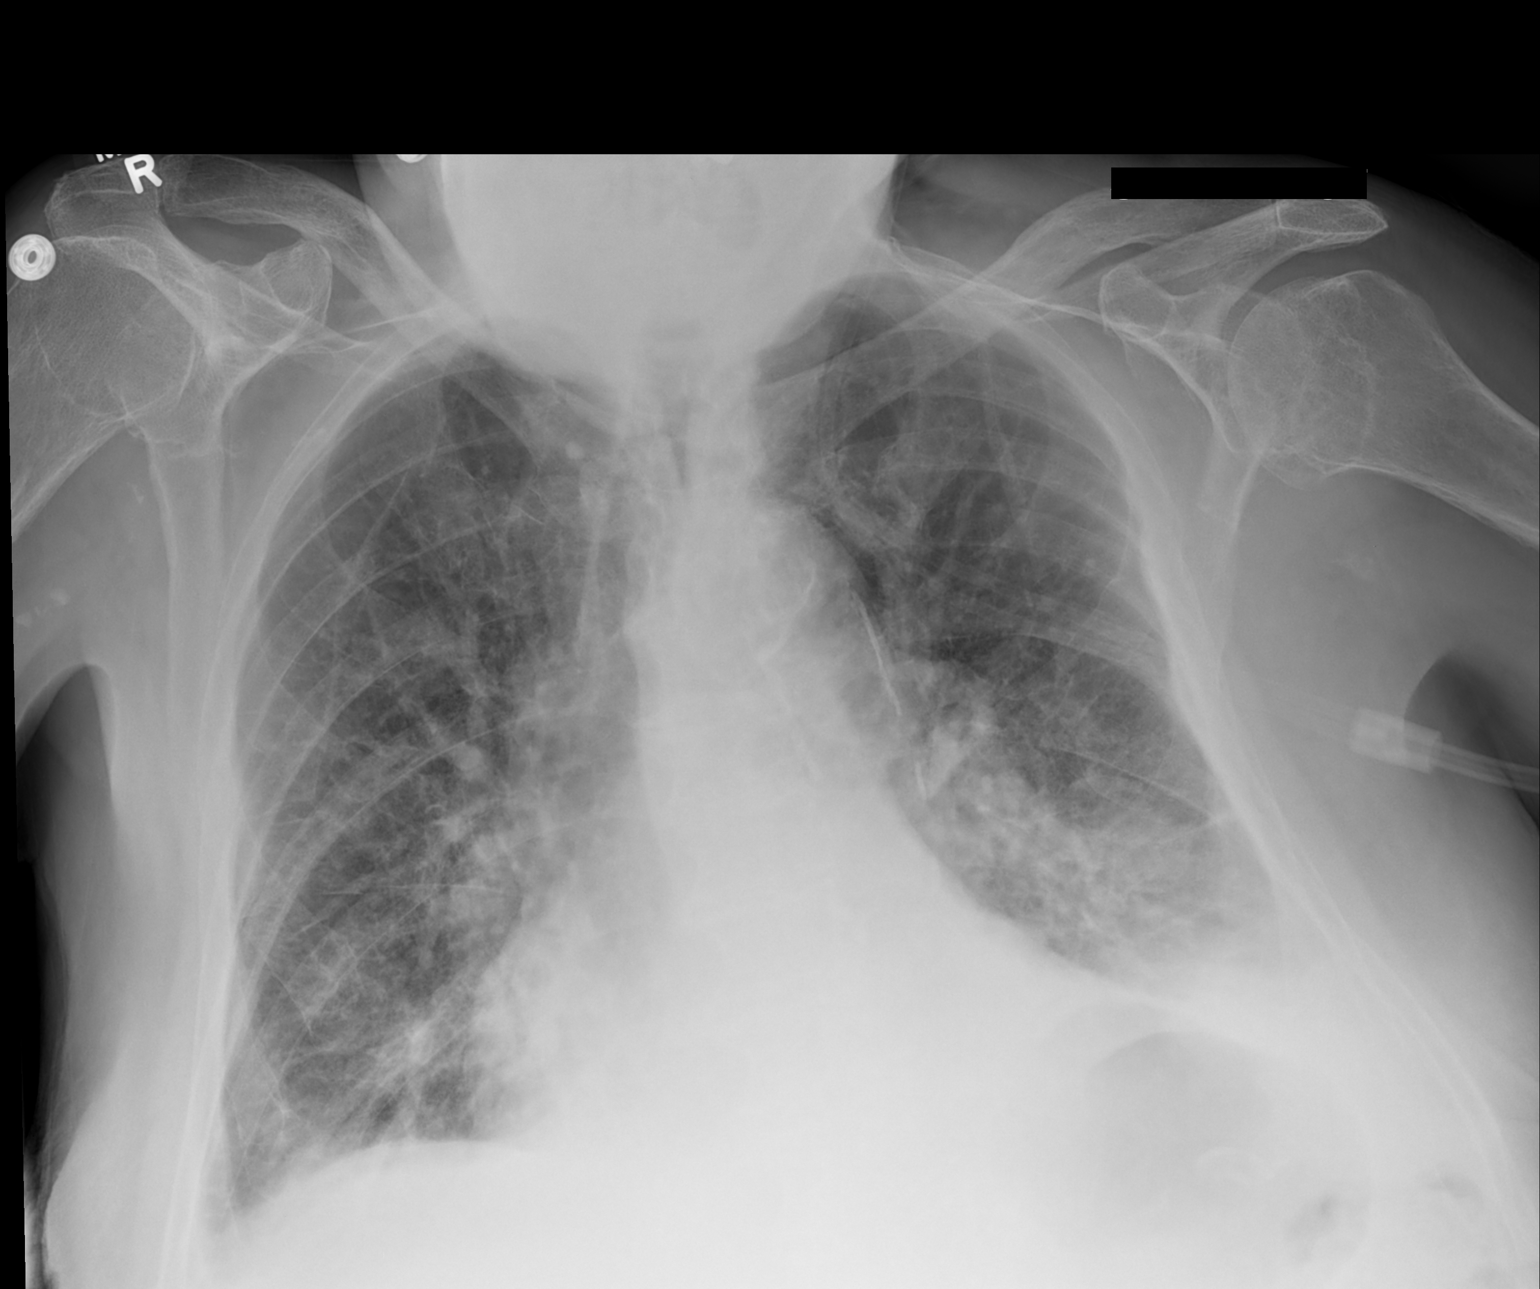

[1 of 1 positions shown; findings below may reference images not displayed]

FINDINGS: Low lung volumes are identified.  Heart size is upper
limits of normal.  Mild aortic ectasia with thoracic aortic
calcification is noted.

Low lung volumes are present.  An area of focal density is
identified at the left lung base with question small left pleural
effusion and septal interstitial lines.  This could represent an
area of focal pneumonia however if the patient lays predominately
with the left side down this could represent an area of focal
alveolar edema.  No stigmata suggestive of interstitial edema is
seen elsewhere in the lungs which are relatively clear with
exception of some focal volume loss at the right lung base.

Bony structures demonstrate age appropriate osteopenia.
IMPRESSION: Left lower lobe density could represent either pneumonia
atelectasis or positional edema.

## 2014-07-20 IMAGING — CR DG CHEST 2V
2 series · 2 of 2 positions shown · non-contrast
Comparison: DG CHEST 2 VIEW dated 02/05/2013

CLINICAL DATA: Pneumonia, congestive heart failure

[w chest lat]
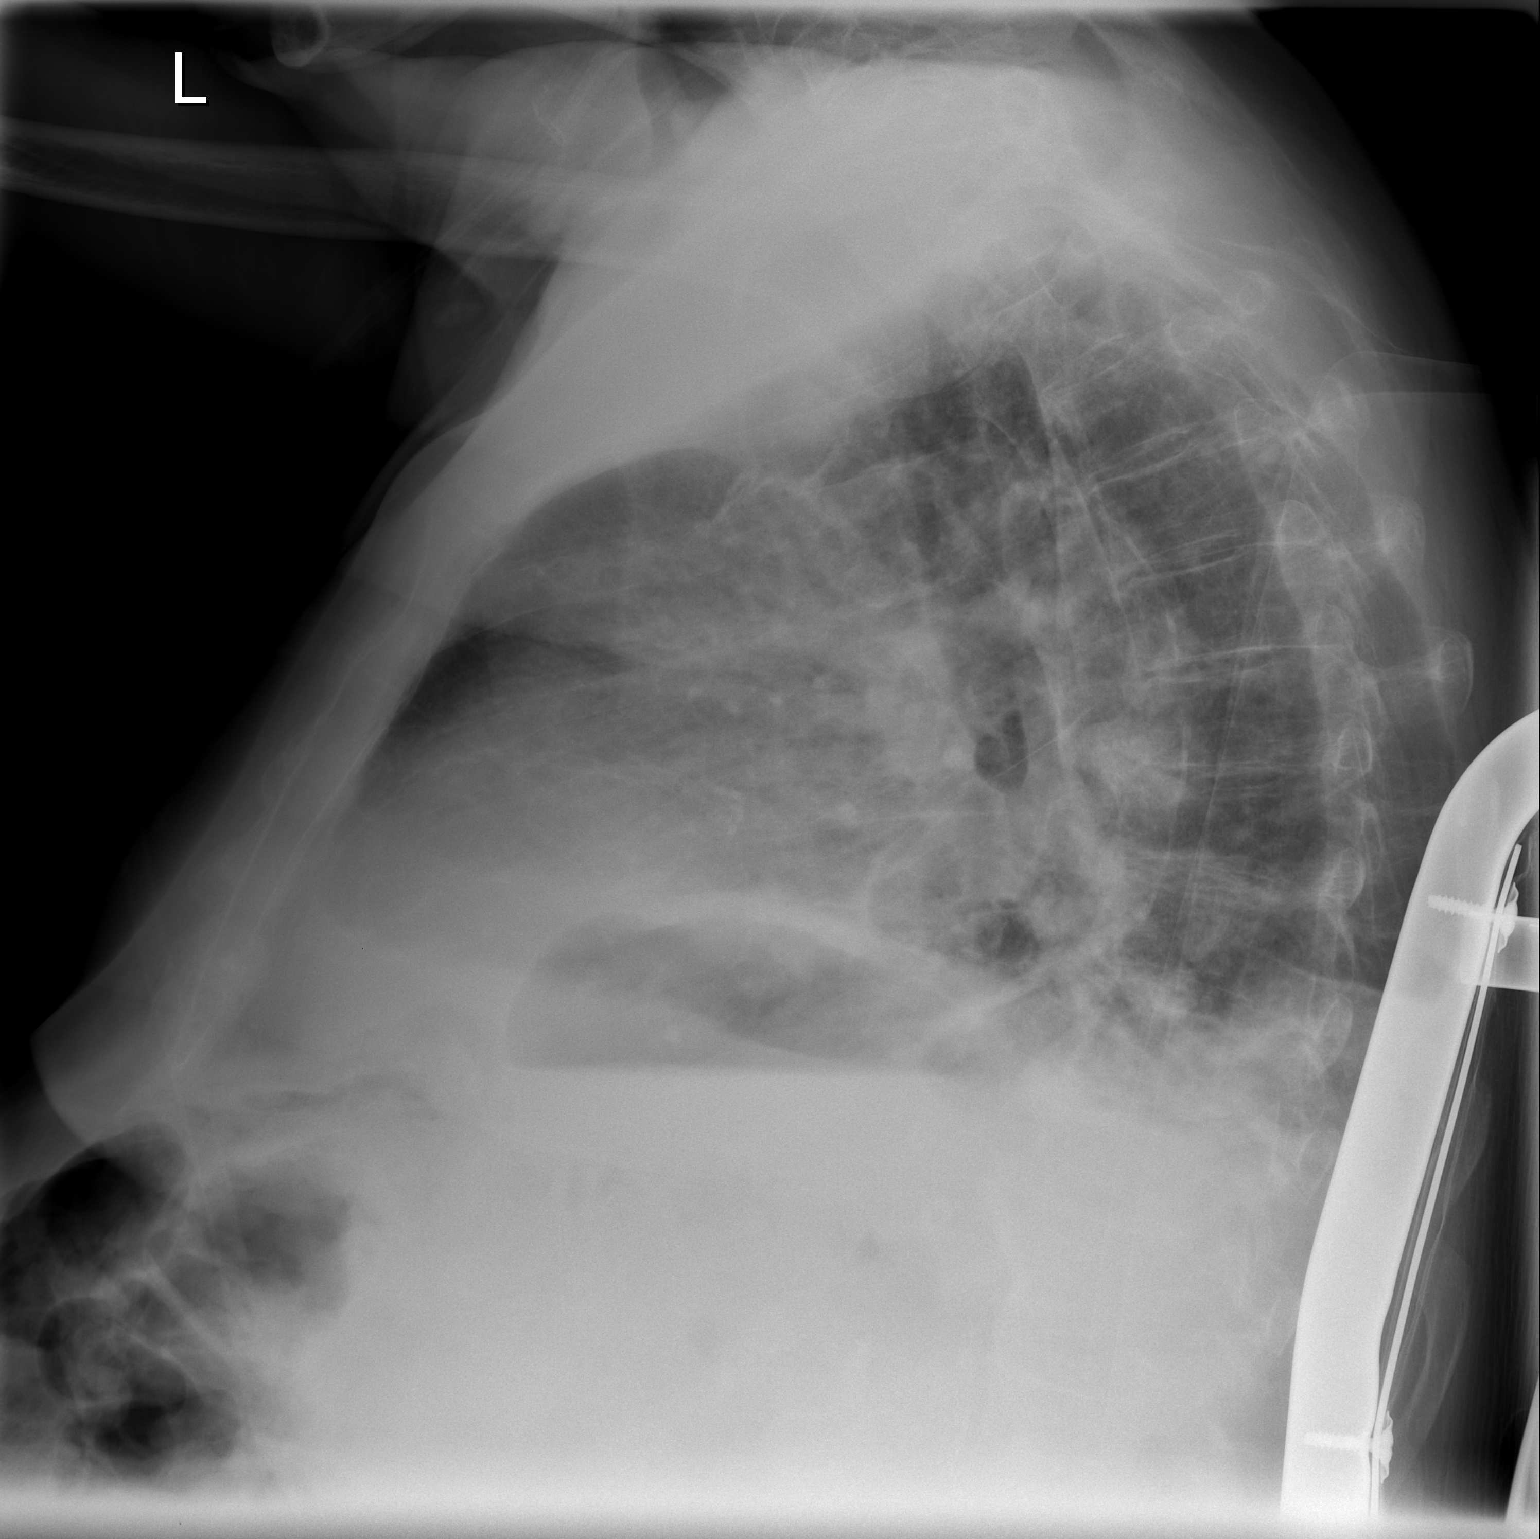

[view not recorded]
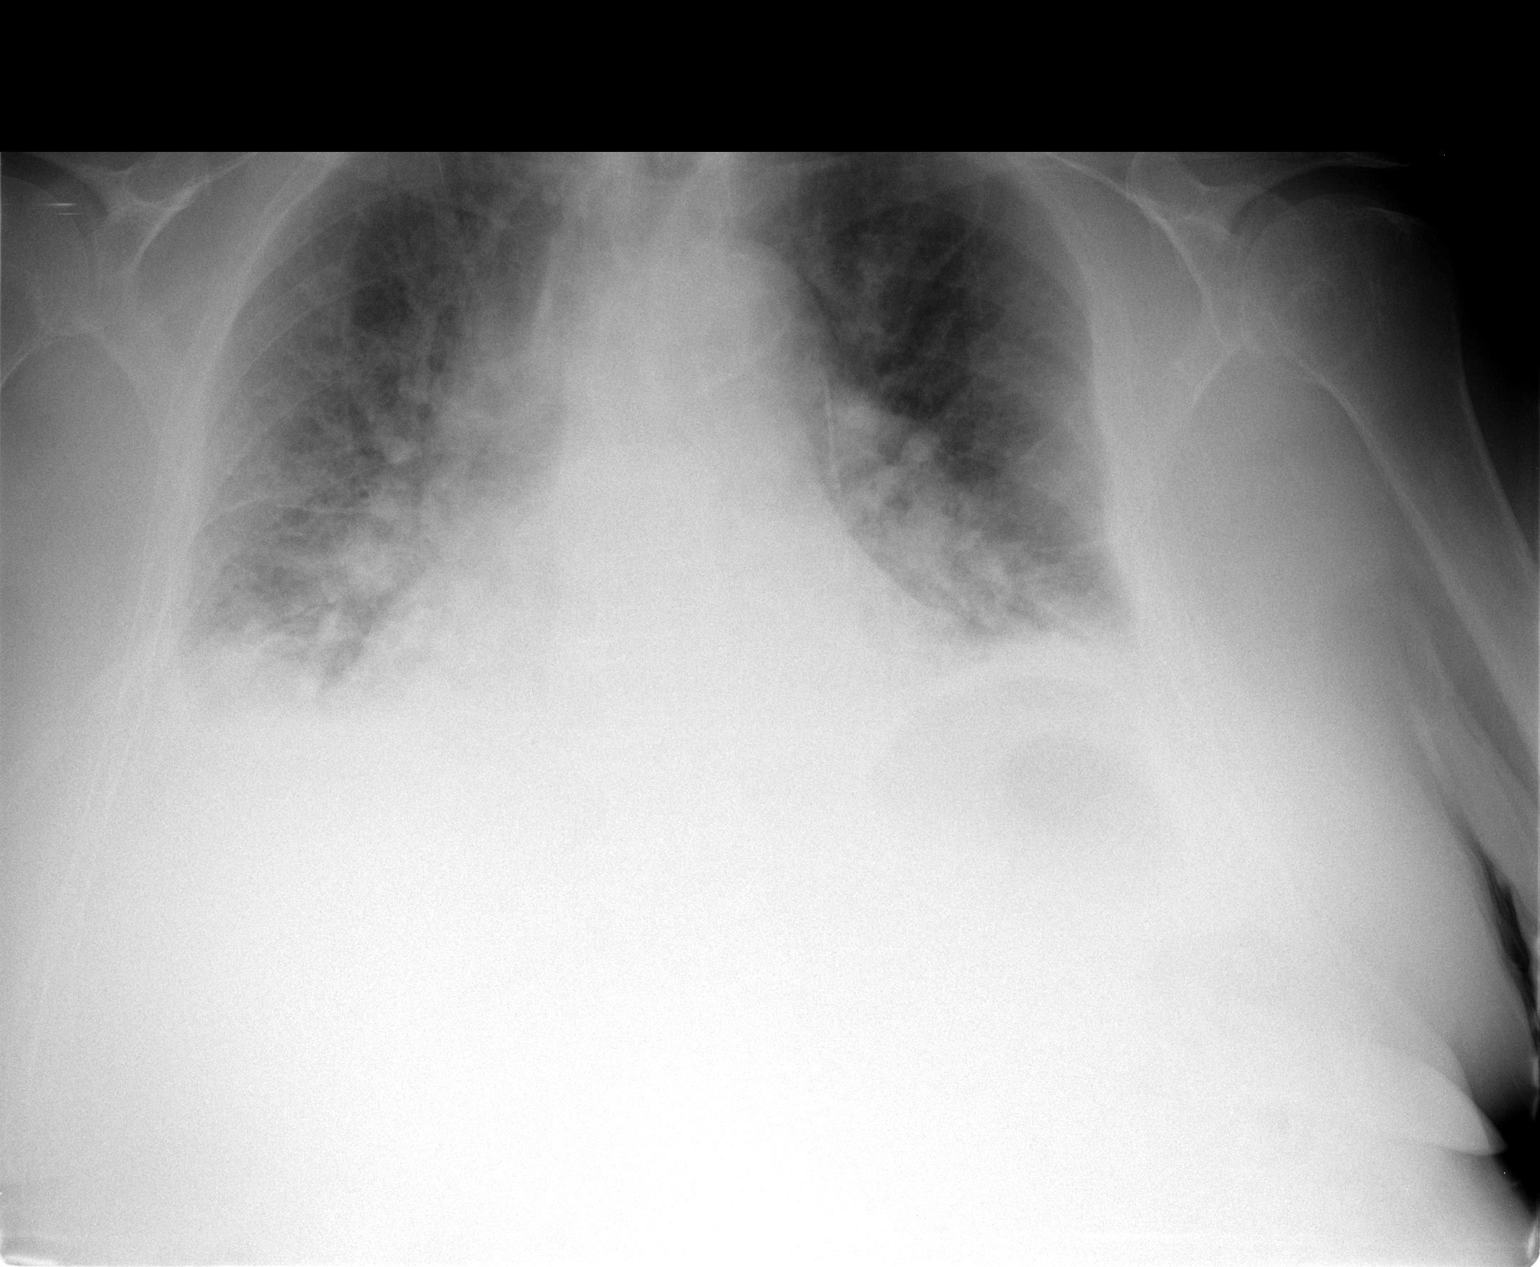

[2 of 2 positions shown; findings below may reference images not displayed]

FINDINGS: Stable enlarged cardiac silhouette. There is mild pulmonary edema
pattern not changed from prior. Small bilateral pleural effusions
are present. No pneumothorax. Degenerative osteophytosis of the
thoracic spine.
IMPRESSION: 1. Cardiomegaly and mild edema
2. Small effusions are improved compared to prior

EXAM THE:
CHEST  2 VIEW
